# Patient Record
Sex: Female | Born: 1950 | Race: White | Hispanic: No | Marital: Married | Smoking: Former smoker
Health system: Southern US, Community
[De-identification: ages and names within clinical notes are randomized; demographics above are authoritative.]

## PROBLEM LIST (undated history)

## (undated) DIAGNOSIS — D6481 Anemia due to antineoplastic chemotherapy: Principal | ICD-10-CM

## (undated) DIAGNOSIS — Z923 Personal history of irradiation: Secondary | ICD-10-CM

## (undated) DIAGNOSIS — C7931 Secondary malignant neoplasm of brain: Secondary | ICD-10-CM

## (undated) DIAGNOSIS — I10 Essential (primary) hypertension: Secondary | ICD-10-CM

## (undated) DIAGNOSIS — T451X5A Adverse effect of antineoplastic and immunosuppressive drugs, initial encounter: Principal | ICD-10-CM

## (undated) DIAGNOSIS — E0500 Thyrotoxicosis with diffuse goiter without thyrotoxic crisis or storm: Secondary | ICD-10-CM

## (undated) DIAGNOSIS — IMO0001 Reserved for inherently not codable concepts without codable children: Secondary | ICD-10-CM

## (undated) DIAGNOSIS — C801 Malignant (primary) neoplasm, unspecified: Secondary | ICD-10-CM

## (undated) DIAGNOSIS — E079 Disorder of thyroid, unspecified: Secondary | ICD-10-CM

## (undated) DIAGNOSIS — C7951 Secondary malignant neoplasm of bone: Secondary | ICD-10-CM

## (undated) DIAGNOSIS — IMO0002 Reserved for concepts with insufficient information to code with codable children: Secondary | ICD-10-CM

## (undated) DIAGNOSIS — C50919 Malignant neoplasm of unspecified site of unspecified female breast: Principal | ICD-10-CM

## (undated) HISTORY — DX: Adverse effect of antineoplastic and immunosuppressive drugs, initial encounter: T45.1X5A

## (undated) HISTORY — DX: Secondary malignant neoplasm of bone: C79.51

## (undated) HISTORY — DX: Secondary malignant neoplasm of brain: C79.31

## (undated) HISTORY — DX: Reserved for concepts with insufficient information to code with codable children: IMO0002

## (undated) HISTORY — PX: CERVICAL DISC SURGERY: SHX588

## (undated) HISTORY — PX: BREAST BIOPSY: SHX20

## (undated) HISTORY — DX: Malignant (primary) neoplasm, unspecified: C80.1

## (undated) HISTORY — PX: TUBAL LIGATION: SHX77

## (undated) HISTORY — DX: Anemia due to antineoplastic chemotherapy: D64.81

## (undated) HISTORY — DX: Essential (primary) hypertension: I10

## (undated) HISTORY — DX: Disorder of thyroid, unspecified: E07.9

## (undated) HISTORY — PX: OTHER SURGICAL HISTORY: SHX169

## (undated) HISTORY — PX: APPENDECTOMY: SHX54

## (undated) HISTORY — PX: AXILLARY NODE DISSECTION: SHX1211

## (undated) HISTORY — DX: Malignant neoplasm of unspecified site of unspecified female breast: C50.919

## (undated) HISTORY — PX: PARTIAL HYSTERECTOMY: SHX80

## (undated) HISTORY — DX: Thyrotoxicosis with diffuse goiter without thyrotoxic crisis or storm: E05.00

## (undated) HISTORY — PX: MASTECTOMY: SHX3

## (undated) HISTORY — PX: CHOLECYSTECTOMY: SHX55

## (undated) HISTORY — DX: Reserved for inherently not codable concepts without codable children: IMO0001

## (undated) HISTORY — PX: EYE SURGERY: SHX253

---

## 2000-05-01 ENCOUNTER — Encounter: Payer: Self-pay | Admitting: Neurosurgery

## 2000-05-01 ENCOUNTER — Encounter: Admission: RE | Admit: 2000-05-01 | Discharge: 2000-05-01 | Payer: Self-pay | Admitting: Neurosurgery

## 2001-07-27 ENCOUNTER — Encounter: Payer: Self-pay | Admitting: Specialist

## 2001-07-27 ENCOUNTER — Ambulatory Visit (HOSPITAL_COMMUNITY): Admission: RE | Admit: 2001-07-27 | Discharge: 2001-07-27 | Payer: Self-pay | Admitting: *Deleted

## 2001-07-29 ENCOUNTER — Other Ambulatory Visit: Admission: RE | Admit: 2001-07-29 | Discharge: 2001-07-29 | Payer: Self-pay | Admitting: Specialist

## 2001-08-17 ENCOUNTER — Ambulatory Visit (HOSPITAL_COMMUNITY): Admission: RE | Admit: 2001-08-17 | Discharge: 2001-08-17 | Payer: Self-pay | Admitting: Specialist

## 2001-08-17 ENCOUNTER — Encounter: Payer: Self-pay | Admitting: Specialist

## 2002-08-10 ENCOUNTER — Encounter: Payer: Self-pay | Admitting: Specialist

## 2002-08-10 ENCOUNTER — Ambulatory Visit (HOSPITAL_COMMUNITY): Admission: RE | Admit: 2002-08-10 | Discharge: 2002-08-10 | Payer: Self-pay | Admitting: Specialist

## 2003-10-19 ENCOUNTER — Ambulatory Visit (HOSPITAL_COMMUNITY): Admission: RE | Admit: 2003-10-19 | Discharge: 2003-10-19 | Payer: Self-pay | Admitting: Specialist

## 2003-10-19 ENCOUNTER — Ambulatory Visit (HOSPITAL_COMMUNITY): Admission: RE | Admit: 2003-10-19 | Discharge: 2003-10-19 | Payer: Self-pay | Admitting: Family Medicine

## 2005-06-21 ENCOUNTER — Ambulatory Visit (HOSPITAL_COMMUNITY): Admission: RE | Admit: 2005-06-21 | Discharge: 2005-06-21 | Payer: Self-pay | Admitting: Family Medicine

## 2006-04-25 ENCOUNTER — Ambulatory Visit (HOSPITAL_COMMUNITY): Admission: RE | Admit: 2006-04-25 | Discharge: 2006-04-25 | Payer: Self-pay | Admitting: Obstetrics & Gynecology

## 2007-05-15 ENCOUNTER — Ambulatory Visit: Payer: Self-pay | Admitting: Sports Medicine

## 2007-05-15 ENCOUNTER — Ambulatory Visit (HOSPITAL_COMMUNITY): Admission: RE | Admit: 2007-05-15 | Discharge: 2007-05-15 | Payer: Self-pay | Admitting: Obstetrics & Gynecology

## 2007-05-15 ENCOUNTER — Encounter (INDEPENDENT_AMBULATORY_CARE_PROVIDER_SITE_OTHER): Payer: Self-pay | Admitting: *Deleted

## 2007-05-15 ENCOUNTER — Ambulatory Visit (HOSPITAL_COMMUNITY): Admission: RE | Admit: 2007-05-15 | Discharge: 2007-05-15 | Payer: Self-pay | Admitting: Family Medicine

## 2007-05-15 DIAGNOSIS — S96819A Strain of other specified muscles and tendons at ankle and foot level, unspecified foot, initial encounter: Secondary | ICD-10-CM

## 2007-05-15 DIAGNOSIS — S93499A Sprain of other ligament of unspecified ankle, initial encounter: Secondary | ICD-10-CM | POA: Insufficient documentation

## 2007-05-25 ENCOUNTER — Encounter: Admission: RE | Admit: 2007-05-25 | Discharge: 2007-05-25 | Payer: Self-pay | Admitting: Obstetrics & Gynecology

## 2007-11-20 ENCOUNTER — Other Ambulatory Visit: Admission: RE | Admit: 2007-11-20 | Discharge: 2007-11-20 | Payer: Self-pay | Admitting: Obstetrics & Gynecology

## 2007-11-30 ENCOUNTER — Encounter: Admission: RE | Admit: 2007-11-30 | Discharge: 2007-11-30 | Payer: Self-pay | Admitting: Obstetrics & Gynecology

## 2007-12-03 HISTORY — PX: PORTACATH PLACEMENT: SHX2246

## 2008-02-07 ENCOUNTER — Encounter: Admission: RE | Admit: 2008-02-07 | Discharge: 2008-02-07 | Payer: Self-pay | Admitting: Neurosurgery

## 2008-07-18 ENCOUNTER — Encounter: Admission: RE | Admit: 2008-07-18 | Discharge: 2008-07-18 | Payer: Self-pay | Admitting: Obstetrics & Gynecology

## 2008-07-18 ENCOUNTER — Encounter (INDEPENDENT_AMBULATORY_CARE_PROVIDER_SITE_OTHER): Payer: Self-pay | Admitting: Diagnostic Radiology

## 2008-07-24 ENCOUNTER — Encounter: Admission: RE | Admit: 2008-07-24 | Discharge: 2008-07-24 | Payer: Self-pay | Admitting: Obstetrics & Gynecology

## 2008-07-28 ENCOUNTER — Encounter: Admission: RE | Admit: 2008-07-28 | Discharge: 2008-07-28 | Payer: Self-pay | Admitting: Obstetrics & Gynecology

## 2008-07-28 ENCOUNTER — Encounter (INDEPENDENT_AMBULATORY_CARE_PROVIDER_SITE_OTHER): Payer: Self-pay | Admitting: Diagnostic Radiology

## 2008-08-04 ENCOUNTER — Encounter (INDEPENDENT_AMBULATORY_CARE_PROVIDER_SITE_OTHER): Payer: Self-pay | Admitting: Diagnostic Radiology

## 2008-08-04 ENCOUNTER — Encounter: Admission: RE | Admit: 2008-08-04 | Discharge: 2008-08-04 | Payer: Self-pay | Admitting: Obstetrics & Gynecology

## 2008-08-11 ENCOUNTER — Ambulatory Visit (HOSPITAL_COMMUNITY): Payer: Self-pay | Admitting: Oncology

## 2008-08-11 ENCOUNTER — Encounter (INDEPENDENT_AMBULATORY_CARE_PROVIDER_SITE_OTHER): Payer: Self-pay | Admitting: Surgery

## 2008-08-11 ENCOUNTER — Ambulatory Visit (HOSPITAL_BASED_OUTPATIENT_CLINIC_OR_DEPARTMENT_OTHER): Admission: RE | Admit: 2008-08-11 | Discharge: 2008-08-11 | Payer: Self-pay | Admitting: Surgery

## 2008-08-11 ENCOUNTER — Encounter (HOSPITAL_COMMUNITY): Admission: RE | Admit: 2008-08-11 | Discharge: 2008-09-10 | Payer: Self-pay | Admitting: Oncology

## 2008-08-29 ENCOUNTER — Ambulatory Visit (HOSPITAL_COMMUNITY): Admission: RE | Admit: 2008-08-29 | Discharge: 2008-08-29 | Payer: Self-pay | Admitting: Oncology

## 2008-09-02 ENCOUNTER — Ambulatory Visit: Admission: RE | Admit: 2008-09-02 | Discharge: 2008-11-29 | Payer: Self-pay | Admitting: Radiation Oncology

## 2008-09-08 ENCOUNTER — Ambulatory Visit (HOSPITAL_COMMUNITY): Admission: RE | Admit: 2008-09-08 | Discharge: 2008-09-08 | Payer: Self-pay | Admitting: Oncology

## 2008-09-19 ENCOUNTER — Encounter (HOSPITAL_COMMUNITY): Admission: RE | Admit: 2008-09-19 | Discharge: 2008-10-19 | Payer: Self-pay | Admitting: Oncology

## 2008-10-03 ENCOUNTER — Ambulatory Visit (HOSPITAL_COMMUNITY): Payer: Self-pay | Admitting: Oncology

## 2008-11-14 ENCOUNTER — Encounter (HOSPITAL_COMMUNITY): Admission: RE | Admit: 2008-11-14 | Discharge: 2008-12-14 | Payer: Self-pay | Admitting: Oncology

## 2008-11-21 ENCOUNTER — Ambulatory Visit (HOSPITAL_COMMUNITY): Payer: Self-pay | Admitting: Oncology

## 2009-01-13 ENCOUNTER — Ambulatory Visit (HOSPITAL_COMMUNITY): Payer: Self-pay | Admitting: Oncology

## 2009-01-13 ENCOUNTER — Encounter (HOSPITAL_COMMUNITY): Admission: RE | Admit: 2009-01-13 | Discharge: 2009-02-12 | Payer: Self-pay | Admitting: Oncology

## 2009-02-15 ENCOUNTER — Encounter (HOSPITAL_COMMUNITY): Admission: RE | Admit: 2009-02-15 | Discharge: 2009-02-28 | Payer: Self-pay | Admitting: Oncology

## 2009-02-21 ENCOUNTER — Other Ambulatory Visit: Admission: RE | Admit: 2009-02-21 | Discharge: 2009-02-21 | Payer: Self-pay | Admitting: Obstetrics & Gynecology

## 2009-03-10 ENCOUNTER — Encounter (HOSPITAL_COMMUNITY): Admission: RE | Admit: 2009-03-10 | Discharge: 2009-04-09 | Payer: Self-pay | Admitting: Oncology

## 2009-03-10 ENCOUNTER — Ambulatory Visit (HOSPITAL_COMMUNITY): Payer: Self-pay | Admitting: Oncology

## 2009-05-08 ENCOUNTER — Encounter (HOSPITAL_COMMUNITY): Admission: RE | Admit: 2009-05-08 | Discharge: 2009-06-07 | Payer: Self-pay | Admitting: Oncology

## 2009-05-08 ENCOUNTER — Ambulatory Visit (HOSPITAL_COMMUNITY): Payer: Self-pay | Admitting: Oncology

## 2009-07-11 ENCOUNTER — Ambulatory Visit (HOSPITAL_COMMUNITY): Payer: Self-pay | Admitting: Oncology

## 2009-07-11 ENCOUNTER — Encounter (HOSPITAL_COMMUNITY): Admission: RE | Admit: 2009-07-11 | Discharge: 2009-08-10 | Payer: Self-pay | Admitting: Oncology

## 2009-09-05 ENCOUNTER — Ambulatory Visit (HOSPITAL_COMMUNITY): Payer: Self-pay | Admitting: Oncology

## 2009-09-05 ENCOUNTER — Encounter (HOSPITAL_COMMUNITY): Admission: RE | Admit: 2009-09-05 | Discharge: 2009-10-05 | Payer: Self-pay | Admitting: Oncology

## 2009-11-03 ENCOUNTER — Ambulatory Visit (HOSPITAL_COMMUNITY): Payer: Self-pay | Admitting: Oncology

## 2009-11-03 ENCOUNTER — Encounter (HOSPITAL_COMMUNITY): Admission: RE | Admit: 2009-11-03 | Discharge: 2009-11-29 | Payer: Self-pay | Admitting: Oncology

## 2009-11-14 ENCOUNTER — Ambulatory Visit (HOSPITAL_COMMUNITY): Admission: RE | Admit: 2009-11-14 | Discharge: 2009-11-14 | Payer: Self-pay | Admitting: Neurosurgery

## 2009-11-16 ENCOUNTER — Ambulatory Visit: Admission: RE | Admit: 2009-11-16 | Discharge: 2009-12-01 | Payer: Self-pay | Admitting: Radiation Oncology

## 2009-12-03 ENCOUNTER — Ambulatory Visit: Admission: RE | Admit: 2009-12-03 | Discharge: 2009-12-22 | Payer: Self-pay | Admitting: Radiation Oncology

## 2009-12-08 ENCOUNTER — Encounter (HOSPITAL_COMMUNITY): Admission: RE | Admit: 2009-12-08 | Discharge: 2010-01-07 | Payer: Self-pay | Admitting: Oncology

## 2010-01-12 ENCOUNTER — Ambulatory Visit (HOSPITAL_COMMUNITY): Payer: Self-pay | Admitting: Oncology

## 2010-01-12 ENCOUNTER — Encounter (HOSPITAL_COMMUNITY): Admission: RE | Admit: 2010-01-12 | Discharge: 2010-02-11 | Payer: Self-pay | Admitting: Oncology

## 2010-02-09 ENCOUNTER — Encounter (HOSPITAL_COMMUNITY): Admission: RE | Admit: 2010-02-09 | Discharge: 2010-03-11 | Payer: Self-pay | Admitting: Oncology

## 2010-03-08 ENCOUNTER — Ambulatory Visit (HOSPITAL_COMMUNITY): Payer: Self-pay | Admitting: Oncology

## 2010-03-12 ENCOUNTER — Other Ambulatory Visit: Admission: RE | Admit: 2010-03-12 | Discharge: 2010-03-12 | Payer: Self-pay | Admitting: Obstetrics & Gynecology

## 2010-03-19 ENCOUNTER — Encounter (HOSPITAL_COMMUNITY): Admission: RE | Admit: 2010-03-19 | Discharge: 2010-04-18 | Payer: Self-pay | Admitting: Oncology

## 2010-05-03 ENCOUNTER — Ambulatory Visit (HOSPITAL_COMMUNITY): Payer: Self-pay | Admitting: Oncology

## 2010-05-03 ENCOUNTER — Encounter (HOSPITAL_COMMUNITY): Admission: RE | Admit: 2010-05-03 | Discharge: 2010-06-02 | Payer: Self-pay | Admitting: Oncology

## 2010-05-30 ENCOUNTER — Ambulatory Visit (HOSPITAL_COMMUNITY): Admission: RE | Admit: 2010-05-30 | Discharge: 2010-05-30 | Payer: Self-pay | Admitting: Oncology

## 2010-06-13 ENCOUNTER — Encounter: Admission: RE | Admit: 2010-06-13 | Discharge: 2010-08-13 | Payer: Self-pay | Admitting: Oncology

## 2010-06-15 ENCOUNTER — Encounter (HOSPITAL_COMMUNITY): Admission: RE | Admit: 2010-06-15 | Discharge: 2010-07-15 | Payer: Self-pay | Admitting: Oncology

## 2010-07-05 ENCOUNTER — Ambulatory Visit (HOSPITAL_COMMUNITY): Payer: Self-pay | Admitting: Oncology

## 2010-08-02 ENCOUNTER — Encounter (HOSPITAL_COMMUNITY)
Admission: RE | Admit: 2010-08-02 | Discharge: 2010-08-31 | Payer: Self-pay | Source: Home / Self Care | Admitting: Oncology

## 2010-09-07 ENCOUNTER — Ambulatory Visit (HOSPITAL_COMMUNITY): Payer: Self-pay | Admitting: Oncology

## 2010-09-07 ENCOUNTER — Encounter (HOSPITAL_COMMUNITY)
Admission: RE | Admit: 2010-09-07 | Discharge: 2010-10-07 | Payer: Self-pay | Source: Home / Self Care | Admitting: Oncology

## 2010-10-10 ENCOUNTER — Ambulatory Visit (HOSPITAL_COMMUNITY): Admission: RE | Admit: 2010-10-10 | Discharge: 2010-10-10 | Payer: Self-pay | Admitting: Oncology

## 2010-10-11 ENCOUNTER — Encounter (HOSPITAL_COMMUNITY)
Admission: RE | Admit: 2010-10-11 | Discharge: 2010-11-10 | Payer: Self-pay | Source: Home / Self Care | Attending: Oncology | Admitting: Oncology

## 2010-11-02 ENCOUNTER — Encounter (HOSPITAL_COMMUNITY)
Admission: RE | Admit: 2010-11-02 | Discharge: 2010-12-02 | Payer: Self-pay | Source: Home / Self Care | Attending: Oncology | Admitting: Oncology

## 2010-11-02 ENCOUNTER — Ambulatory Visit (HOSPITAL_COMMUNITY): Payer: Self-pay | Admitting: Oncology

## 2010-12-07 ENCOUNTER — Encounter (HOSPITAL_COMMUNITY)
Admission: RE | Admit: 2010-12-07 | Discharge: 2011-01-01 | Payer: Self-pay | Source: Home / Self Care | Attending: Oncology | Admitting: Oncology

## 2011-01-04 ENCOUNTER — Encounter (HOSPITAL_COMMUNITY): Payer: PRIVATE HEALTH INSURANCE | Attending: Oncology

## 2011-01-04 ENCOUNTER — Ambulatory Visit (HOSPITAL_COMMUNITY): Payer: PRIVATE HEALTH INSURANCE

## 2011-01-04 ENCOUNTER — Ambulatory Visit (HOSPITAL_COMMUNITY): Admit: 2011-01-04 | Payer: Self-pay | Admitting: Oncology

## 2011-01-04 DIAGNOSIS — E119 Type 2 diabetes mellitus without complications: Secondary | ICD-10-CM | POA: Insufficient documentation

## 2011-01-04 DIAGNOSIS — C50919 Malignant neoplasm of unspecified site of unspecified female breast: Secondary | ICD-10-CM

## 2011-01-04 DIAGNOSIS — C7951 Secondary malignant neoplasm of bone: Secondary | ICD-10-CM | POA: Insufficient documentation

## 2011-01-04 DIAGNOSIS — Z79899 Other long term (current) drug therapy: Secondary | ICD-10-CM | POA: Insufficient documentation

## 2011-01-04 DIAGNOSIS — C7952 Secondary malignant neoplasm of bone marrow: Secondary | ICD-10-CM | POA: Insufficient documentation

## 2011-01-04 LAB — CBC
MCV: 91 fL (ref 78.0–100.0)
Platelets: 206 10*3/uL (ref 150–400)
RBC: 4.02 MIL/uL (ref 3.87–5.11)
RDW: 12.9 % (ref 11.5–15.5)
WBC: 8.8 10*3/uL (ref 4.0–10.5)

## 2011-01-04 LAB — COMPREHENSIVE METABOLIC PANEL
AST: 22 U/L (ref 0–37)
Albumin: 3.8 g/dL (ref 3.5–5.2)
Alkaline Phosphatase: 56 U/L (ref 39–117)
BUN: 11 mg/dL (ref 6–23)
Chloride: 106 mEq/L (ref 96–112)
GFR calc Af Amer: >60 mL/min (ref >60)
Potassium: 4 mEq/L (ref 3.5–5.1)
Total Protein: 6.6 g/dL (ref 6.0–8.3)

## 2011-01-04 LAB — DIFFERENTIAL
Basophils Absolute: 0 10*3/uL (ref 0.0–0.1)
Basophils Relative: 0 % (ref 0–1)
Eosinophils Absolute: 0.1 10*3/uL (ref 0.0–0.7)
Eosinophils Relative: 1 % (ref 0–5)
Neutrophils Relative %: 69 % (ref 43–77)

## 2011-02-01 ENCOUNTER — Ambulatory Visit (HOSPITAL_COMMUNITY): Payer: PRIVATE HEALTH INSURANCE

## 2011-02-01 ENCOUNTER — Other Ambulatory Visit (HOSPITAL_COMMUNITY): Payer: Self-pay | Admitting: Oncology

## 2011-02-01 ENCOUNTER — Encounter (HOSPITAL_COMMUNITY): Payer: Medicare Other | Attending: Oncology

## 2011-02-01 DIAGNOSIS — C7952 Secondary malignant neoplasm of bone marrow: Secondary | ICD-10-CM

## 2011-02-01 DIAGNOSIS — E119 Type 2 diabetes mellitus without complications: Secondary | ICD-10-CM | POA: Insufficient documentation

## 2011-02-01 DIAGNOSIS — C7951 Secondary malignant neoplasm of bone: Secondary | ICD-10-CM

## 2011-02-01 DIAGNOSIS — C50919 Malignant neoplasm of unspecified site of unspecified female breast: Secondary | ICD-10-CM

## 2011-02-01 DIAGNOSIS — Z79899 Other long term (current) drug therapy: Secondary | ICD-10-CM | POA: Insufficient documentation

## 2011-02-02 LAB — CANCER ANTIGEN 27.29: CA 27.29: 38 U/mL (ref 0–39)

## 2011-02-11 LAB — COMPREHENSIVE METABOLIC PANEL
ALT: 14 U/L (ref 0–35)
Albumin: 4.1 g/dL (ref 3.5–5.2)
Alkaline Phosphatase: 50 U/L (ref 39–117)
Chloride: 107 mEq/L (ref 96–112)
Glucose, Bld: 102 mg/dL — ABNORMAL HIGH (ref 70–99)
Potassium: 3.9 mEq/L (ref 3.5–5.1)
Sodium: 141 mEq/L (ref 135–145)
Total Bilirubin: 0.6 mg/dL (ref 0.3–1.2)
Total Protein: 6.5 g/dL (ref 6.0–8.3)

## 2011-02-11 LAB — CANCER ANTIGEN 27.29: CA 27.29: 32 U/mL (ref 0–39)

## 2011-02-12 LAB — COMPREHENSIVE METABOLIC PANEL
ALT: 14 U/L (ref 0–35)
AST: 20 U/L (ref 0–37)
Albumin: 3.8 g/dL (ref 3.5–5.2)
Alkaline Phosphatase: 51 U/L (ref 39–117)
CO2: 25 mEq/L (ref 19–32)
Calcium: 9.9 mg/dL (ref 8.4–10.5)
Creatinine, Ser: 0.66 mg/dL (ref 0.4–1.2)
GFR calc Af Amer: >60 mL/min (ref >60)
GFR calc non Af Amer: >60 mL/min (ref >60)
Glucose, Bld: 115 mg/dL — ABNORMAL HIGH (ref 70–99)
Potassium: 4.1 mEq/L (ref 3.5–5.1)
Sodium: 137 mEq/L (ref 135–145)
Total Protein: 6.3 g/dL (ref 6.0–8.3)

## 2011-02-12 LAB — GLUCOSE, CAPILLARY: Glucose-Capillary: 86 mg/dL (ref 70–99)

## 2011-02-14 LAB — COMPREHENSIVE METABOLIC PANEL
ALT: 12 U/L (ref 0–35)
ALT: 16 U/L (ref 0–35)
Albumin: 4 g/dL (ref 3.5–5.2)
Alkaline Phosphatase: 49 U/L (ref 39–117)
Alkaline Phosphatase: 52 U/L (ref 39–117)
CO2: 25 mEq/L (ref 19–32)
Calcium: 9.6 mg/dL (ref 8.4–10.5)
GFR calc Af Amer: >60 mL/min (ref >60)
GFR calc non Af Amer: >60 mL/min (ref >60)
Glucose, Bld: 94 mg/dL (ref 70–99)
Potassium: 3.7 mEq/L (ref 3.5–5.1)
Sodium: 137 mEq/L (ref 135–145)
Sodium: 137 mEq/L (ref 135–145)
Total Protein: 6.6 g/dL (ref 6.0–8.3)

## 2011-02-14 LAB — CBC
HCT: 36.2 % (ref 36.0–46.0)
Hemoglobin: 12.3 g/dL (ref 12.0–15.0)
MCHC: 34 g/dL (ref 30.0–36.0)
Platelets: 216 10*3/uL (ref 150–400)
RDW: 13.6 % (ref 11.5–15.5)
WBC: 7 10*3/uL (ref 4.0–10.5)

## 2011-02-14 LAB — DIFFERENTIAL
Basophils Absolute: 0 10*3/uL (ref 0.0–0.1)
Basophils Relative: 0 % (ref 0–1)
Basophils Relative: 0 % (ref 0–1)
Eosinophils Absolute: 0 10*3/uL (ref 0.0–0.7)
Eosinophils Absolute: 0 10*3/uL (ref 0.0–0.7)
Lymphs Abs: 1.2 10*3/uL (ref 0.7–4.0)
Lymphs Abs: 1.4 10*3/uL (ref 0.7–4.0)
Monocytes Absolute: 0.6 10*3/uL (ref 0.1–1.0)
Monocytes Relative: 9 % (ref 3–12)
Neutrophils Relative %: 70 % (ref 43–77)

## 2011-02-14 LAB — CANCER ANTIGEN 27.29
CA 27.29: 21 U/mL (ref 0–39)
CA 27.29: 23 U/mL (ref 0–39)

## 2011-02-15 LAB — COMPREHENSIVE METABOLIC PANEL
ALT: 15 U/L (ref 0–35)
CO2: 27 mEq/L (ref 19–32)
Calcium: 9.1 mg/dL (ref 8.4–10.5)
Chloride: 106 mEq/L (ref 96–112)
Creatinine, Ser: 0.7 mg/dL (ref 0.4–1.2)
GFR calc non Af Amer: >60 mL/min (ref >60)
Glucose, Bld: 121 mg/dL — ABNORMAL HIGH (ref 70–99)
Sodium: 137 mEq/L (ref 135–145)
Total Bilirubin: 0.5 mg/dL (ref 0.3–1.2)

## 2011-02-17 LAB — COMPREHENSIVE METABOLIC PANEL
ALT: 13 U/L (ref 0–35)
AST: 21 U/L (ref 0–37)
Albumin: 3.8 g/dL (ref 3.5–5.2)
Alkaline Phosphatase: 39 U/L (ref 39–117)
BUN: 15 mg/dL (ref 6–23)
CO2: 27 mEq/L (ref 19–32)
Calcium: 9.7 mg/dL (ref 8.4–10.5)
Calcium: 9.5 mg/dL (ref 8.4–10.5)
Chloride: 105 mEq/L (ref 96–112)
Creatinine, Ser: 0.65 mg/dL (ref 0.4–1.2)
GFR calc Af Amer: >60 mL/min (ref >60)
GFR calc non Af Amer: >60 mL/min (ref >60)
Glucose, Bld: 87 mg/dL (ref 70–99)
Potassium: 3.6 mEq/L (ref 3.5–5.1)
Potassium: 3.6 mEq/L (ref 3.5–5.1)
Sodium: 137 mEq/L (ref 135–145)
Total Bilirubin: 0.4 mg/dL (ref 0.3–1.2)
Total Protein: 6.3 g/dL (ref 6.0–8.3)

## 2011-02-17 LAB — CANCER ANTIGEN 27.29: CA 27.29: 17 U/mL (ref 0–39)

## 2011-02-18 LAB — COMPREHENSIVE METABOLIC PANEL
Alkaline Phosphatase: 60 U/L (ref 39–117)
BUN: 9 mg/dL (ref 6–23)
Glucose, Bld: 86 mg/dL (ref 70–99)
Potassium: 3.8 mEq/L (ref 3.5–5.1)
Total Bilirubin: 0.4 mg/dL (ref 0.3–1.2)
Total Protein: 6.7 g/dL (ref 6.0–8.3)

## 2011-02-19 ENCOUNTER — Ambulatory Visit (HOSPITAL_COMMUNITY): Payer: PRIVATE HEALTH INSURANCE | Admitting: Oncology

## 2011-02-19 ENCOUNTER — Other Ambulatory Visit (HOSPITAL_COMMUNITY): Payer: Self-pay | Admitting: Oncology

## 2011-02-19 DIAGNOSIS — C50919 Malignant neoplasm of unspecified site of unspecified female breast: Secondary | ICD-10-CM

## 2011-02-19 LAB — CBC
HCT: 35.6 % — ABNORMAL LOW (ref 36.0–46.0)
Platelets: 227 10*3/uL (ref 150–400)
WBC: 6.4 10*3/uL (ref 4.0–10.5)

## 2011-02-19 LAB — DIFFERENTIAL
Basophils Absolute: 0 10*3/uL (ref 0.0–0.1)
Basophils Relative: 0 % (ref 0–1)
Eosinophils Relative: 2 % (ref 0–5)
Monocytes Absolute: 0.6 10*3/uL (ref 0.1–1.0)

## 2011-02-19 LAB — COMPREHENSIVE METABOLIC PANEL
ALT: 11 U/L (ref 0–35)
AST: 20 U/L (ref 0–37)
Albumin: 3.8 g/dL (ref 3.5–5.2)
Alkaline Phosphatase: 56 U/L (ref 39–117)
Chloride: 105 mEq/L (ref 96–112)
GFR calc Af Amer: >60 mL/min (ref >60)
Potassium: 3.8 mEq/L (ref 3.5–5.1)
Sodium: 138 mEq/L (ref 135–145)
Total Bilirubin: 0.6 mg/dL (ref 0.3–1.2)

## 2011-02-19 LAB — CANCER ANTIGEN 27.29: CA 27.29: 21 U/mL (ref 0–39)

## 2011-02-20 LAB — COMPREHENSIVE METABOLIC PANEL
Albumin: 4 g/dL (ref 3.5–5.2)
Alkaline Phosphatase: 60 U/L (ref 39–117)
BUN: 13 mg/dL (ref 6–23)
BUN: 15 mg/dL (ref 6–23)
CO2: 25 mEq/L (ref 19–32)
Calcium: 9.4 mg/dL (ref 8.4–10.5)
Chloride: 105 mEq/L (ref 96–112)
GFR calc Af Amer: >60 mL/min (ref >60)
GFR calc non Af Amer: >60 mL/min (ref >60)
Glucose, Bld: 96 mg/dL (ref 70–99)
Sodium: 137 mEq/L (ref 135–145)
Sodium: 138 mEq/L (ref 135–145)
Total Bilirubin: 0.4 mg/dL (ref 0.3–1.2)
Total Protein: 6.2 g/dL (ref 6.0–8.3)

## 2011-02-20 LAB — CBC
MCHC: 34.6 g/dL (ref 30.0–36.0)
MCV: 94 fL (ref 78.0–100.0)
RDW: 12.9 % (ref 11.5–15.5)

## 2011-02-20 LAB — DIFFERENTIAL
Basophils Absolute: 0 10*3/uL (ref 0.0–0.1)
Basophils Relative: 0 % (ref 0–1)
Eosinophils Relative: 1 % (ref 0–5)
Monocytes Absolute: 0.6 10*3/uL (ref 0.1–1.0)
Neutro Abs: 6.5 10*3/uL (ref 1.7–7.7)

## 2011-02-24 LAB — COMPREHENSIVE METABOLIC PANEL
AST: 21 U/L (ref 0–37)
Albumin: 3.8 g/dL (ref 3.5–5.2)
Alkaline Phosphatase: 54 U/L (ref 39–117)
Chloride: 105 mEq/L (ref 96–112)
GFR calc Af Amer: >60 mL/min (ref >60)
Potassium: 3.7 mEq/L (ref 3.5–5.1)
Sodium: 138 mEq/L (ref 135–145)
Total Bilirubin: 0.4 mg/dL (ref 0.3–1.2)

## 2011-02-24 LAB — CANCER ANTIGEN 27.29: CA 27.29: 26 U/mL (ref 0–39)

## 2011-02-28 ENCOUNTER — Ambulatory Visit (HOSPITAL_COMMUNITY): Payer: PRIVATE HEALTH INSURANCE

## 2011-03-04 ENCOUNTER — Ambulatory Visit (HOSPITAL_COMMUNITY)
Admission: RE | Admit: 2011-03-04 | Discharge: 2011-03-04 | Disposition: A | Discharge status: Home / Self Care | Payer: Medicare Other | Source: Ambulatory Visit | Attending: Oncology | Admitting: Oncology

## 2011-03-04 DIAGNOSIS — I972 Postmastectomy lymphedema syndrome: Secondary | ICD-10-CM | POA: Insufficient documentation

## 2011-03-04 DIAGNOSIS — IMO0001 Reserved for inherently not codable concepts without codable children: Secondary | ICD-10-CM | POA: Insufficient documentation

## 2011-03-04 DIAGNOSIS — E119 Type 2 diabetes mellitus without complications: Secondary | ICD-10-CM | POA: Insufficient documentation

## 2011-03-05 ENCOUNTER — Ambulatory Visit (HOSPITAL_COMMUNITY)
Admission: RE | Admit: 2011-03-05 | Discharge: 2011-03-05 | Disposition: A | Discharge status: Home / Self Care | Payer: Medicare Other | Source: Ambulatory Visit | Attending: Oncology | Admitting: Oncology

## 2011-03-05 ENCOUNTER — Ambulatory Visit (HOSPITAL_COMMUNITY): Payer: Medicare Other | Admitting: Physical Therapy

## 2011-03-05 DIAGNOSIS — IMO0001 Reserved for inherently not codable concepts without codable children: Secondary | ICD-10-CM | POA: Insufficient documentation

## 2011-03-05 DIAGNOSIS — I972 Postmastectomy lymphedema syndrome: Secondary | ICD-10-CM | POA: Insufficient documentation

## 2011-03-05 DIAGNOSIS — E119 Type 2 diabetes mellitus without complications: Secondary | ICD-10-CM | POA: Insufficient documentation

## 2011-03-05 LAB — CBC
MCHC: 34.4 g/dL (ref 30.0–36.0)
MCV: 93.7 fL (ref 78.0–100.0)
Platelets: 234 10*3/uL (ref 150–400)
RBC: 3.93 MIL/uL (ref 3.87–5.11)

## 2011-03-05 LAB — DIFFERENTIAL
Eosinophils Relative: 0 % (ref 0–5)
Lymphocytes Relative: 27 % (ref 12–46)
Lymphs Abs: 2 10*3/uL (ref 0.7–4.0)
Neutro Abs: 4.7 10*3/uL (ref 1.7–7.7)

## 2011-03-05 LAB — COMPREHENSIVE METABOLIC PANEL
AST: 19 U/L (ref 0–37)
CO2: 29 mEq/L (ref 19–32)
Calcium: 10 mg/dL (ref 8.4–10.5)
Creatinine, Ser: 0.56 mg/dL (ref 0.4–1.2)
GFR calc Af Amer: >60 mL/min (ref >60)
GFR calc non Af Amer: >60 mL/min (ref >60)

## 2011-03-06 ENCOUNTER — Ambulatory Visit (HOSPITAL_COMMUNITY)
Admission: RE | Admit: 2011-03-06 | Discharge: 2011-03-06 | Disposition: A | Discharge status: Home / Self Care | Payer: Medicare Other | Source: Ambulatory Visit | Admitting: Physical Therapy

## 2011-03-06 LAB — COMPREHENSIVE METABOLIC PANEL
ALT: 11 U/L (ref 0–35)
AST: 17 U/L (ref 0–37)
Albumin: 3.9 g/dL (ref 3.5–5.2)
Alkaline Phosphatase: 56 U/L (ref 39–117)
BUN: 12 mg/dL (ref 6–23)
Chloride: 107 mEq/L (ref 96–112)
GFR calc Af Amer: >60 mL/min (ref >60)
Potassium: 3.9 mEq/L (ref 3.5–5.1)
Sodium: 140 mEq/L (ref 135–145)
Total Bilirubin: 0.3 mg/dL (ref 0.3–1.2)

## 2011-03-06 LAB — CANCER ANTIGEN 27.29: CA 27.29: 21 U/mL (ref 0–39)

## 2011-03-07 ENCOUNTER — Other Ambulatory Visit (HOSPITAL_COMMUNITY): Payer: Self-pay | Admitting: Oncology

## 2011-03-07 ENCOUNTER — Encounter (HOSPITAL_COMMUNITY): Payer: Medicare Other | Attending: Oncology

## 2011-03-07 ENCOUNTER — Ambulatory Visit (HOSPITAL_COMMUNITY)
Admission: RE | Admit: 2011-03-07 | Discharge: 2011-03-07 | Disposition: A | Discharge status: Home / Self Care | Payer: Medicare Other | Source: Ambulatory Visit | Admitting: Physical Therapy

## 2011-03-07 DIAGNOSIS — C7951 Secondary malignant neoplasm of bone: Secondary | ICD-10-CM

## 2011-03-07 DIAGNOSIS — C50919 Malignant neoplasm of unspecified site of unspecified female breast: Secondary | ICD-10-CM

## 2011-03-07 DIAGNOSIS — Z79899 Other long term (current) drug therapy: Secondary | ICD-10-CM | POA: Insufficient documentation

## 2011-03-07 DIAGNOSIS — E119 Type 2 diabetes mellitus without complications: Secondary | ICD-10-CM | POA: Insufficient documentation

## 2011-03-07 DIAGNOSIS — C7952 Secondary malignant neoplasm of bone marrow: Secondary | ICD-10-CM

## 2011-03-07 LAB — COMPREHENSIVE METABOLIC PANEL
ALT: 13 U/L (ref 0–35)
Albumin: 3.9 g/dL (ref 3.5–5.2)
Alkaline Phosphatase: 49 U/L (ref 39–117)
BUN: 14 mg/dL (ref 6–23)
BUN: 15 mg/dL (ref 6–23)
Calcium: 9.6 mg/dL (ref 8.4–10.5)
Calcium: 9.3 mg/dL (ref 8.4–10.5)
Creatinine, Ser: 0.67 mg/dL (ref 0.4–1.2)
GFR calc non Af Amer: >60 mL/min (ref >60)
Glucose, Bld: 106 mg/dL — ABNORMAL HIGH (ref 70–99)
Potassium: 3.5 mEq/L (ref 3.5–5.1)
Sodium: 141 mEq/L (ref 135–145)
Total Protein: 6.2 g/dL (ref 6.0–8.3)
Total Protein: 6.6 g/dL (ref 6.0–8.3)

## 2011-03-07 LAB — CANCER ANTIGEN 27.29
CA 27.29: 39 U/mL (ref 0–39)
CA 27.29: 13 U/mL (ref 0–39)

## 2011-03-08 LAB — COMPREHENSIVE METABOLIC PANEL
BUN: 14 mg/dL (ref 6–23)
CO2: 27 mEq/L (ref 19–32)
Chloride: 106 mEq/L (ref 96–112)
Creatinine, Ser: 0.54 mg/dL (ref 0.4–1.2)
GFR calc non Af Amer: >60 mL/min (ref >60)
Total Bilirubin: 0.4 mg/dL (ref 0.3–1.2)

## 2011-03-08 LAB — CANCER ANTIGEN 27.29: CA 27.29: 17 U/mL (ref 0–39)

## 2011-03-09 LAB — COMPREHENSIVE METABOLIC PANEL
ALT: 13 U/L (ref 0–35)
AST: 20 U/L (ref 0–37)
Calcium: 9.5 mg/dL (ref 8.4–10.5)
GFR calc Af Amer: >60 mL/min (ref >60)
Sodium: 139 mEq/L (ref 135–145)
Total Protein: 6.4 g/dL (ref 6.0–8.3)

## 2011-03-09 LAB — CANCER ANTIGEN 27.29: CA 27.29: 25 U/mL (ref 0–39)

## 2011-03-10 LAB — COMPREHENSIVE METABOLIC PANEL
ALT: 16 U/L (ref 0–35)
Albumin: 3.7 g/dL (ref 3.5–5.2)
Alkaline Phosphatase: 62 U/L (ref 39–117)
BUN: 15 mg/dL (ref 6–23)
Chloride: 105 mEq/L (ref 96–112)
Glucose, Bld: 140 mg/dL — ABNORMAL HIGH (ref 70–99)
Potassium: 3.6 mEq/L (ref 3.5–5.1)
Total Bilirubin: 0.5 mg/dL (ref 0.3–1.2)

## 2011-03-10 LAB — CANCER ANTIGEN 27.29: CA 27.29: 15 U/mL (ref 0–39)

## 2011-03-11 ENCOUNTER — Ambulatory Visit (HOSPITAL_COMMUNITY)
Admission: RE | Admit: 2011-03-11 | Discharge: 2011-03-11 | Disposition: A | Discharge status: Home / Self Care | Payer: Medicare Other | Source: Ambulatory Visit | Attending: *Deleted | Admitting: *Deleted

## 2011-03-11 LAB — COMPREHENSIVE METABOLIC PANEL
ALT: 16 U/L (ref 0–35)
AST: 21 U/L (ref 0–37)
Albumin: 3.9 g/dL (ref 3.5–5.2)
CO2: 27 mEq/L (ref 19–32)
Calcium: 9.8 mg/dL (ref 8.4–10.5)
GFR calc Af Amer: >60 mL/min (ref >60)
GFR calc non Af Amer: >60 mL/min (ref >60)
Sodium: 135 mEq/L (ref 135–145)
Total Protein: 6.8 g/dL (ref 6.0–8.3)

## 2011-03-11 LAB — CBC
MCHC: 35.5 g/dL (ref 30.0–36.0)
Platelets: 222 10*3/uL (ref 150–400)
RBC: 3.94 MIL/uL (ref 3.87–5.11)

## 2011-03-12 ENCOUNTER — Ambulatory Visit (HOSPITAL_COMMUNITY)
Admission: RE | Admit: 2011-03-12 | Discharge: 2011-03-12 | Disposition: A | Discharge status: Home / Self Care | Payer: Medicare Other | Source: Ambulatory Visit | Attending: *Deleted | Admitting: *Deleted

## 2011-03-12 LAB — COMPREHENSIVE METABOLIC PANEL
ALT: 21 U/L (ref 0–35)
Alkaline Phosphatase: 63 U/L (ref 39–117)
BUN: 17 mg/dL (ref 6–23)
CO2: 25 mEq/L (ref 19–32)
Chloride: 107 mEq/L (ref 96–112)
GFR calc non Af Amer: >60 mL/min (ref >60)
Glucose, Bld: 143 mg/dL — ABNORMAL HIGH (ref 70–99)
Potassium: 3.5 mEq/L (ref 3.5–5.1)
Sodium: 139 mEq/L (ref 135–145)
Total Bilirubin: 0.6 mg/dL (ref 0.3–1.2)

## 2011-03-13 ENCOUNTER — Ambulatory Visit (HOSPITAL_COMMUNITY)
Admission: RE | Admit: 2011-03-13 | Discharge: 2011-03-13 | Disposition: A | Discharge status: Home / Self Care | Payer: Medicare Other | Source: Ambulatory Visit | Attending: *Deleted | Admitting: *Deleted

## 2011-03-13 LAB — COMPREHENSIVE METABOLIC PANEL
AST: 26 U/L (ref 0–37)
Albumin: 3.6 g/dL (ref 3.5–5.2)
BUN: 17 mg/dL (ref 6–23)
Calcium: 9.4 mg/dL (ref 8.4–10.5)
Creatinine, Ser: 0.6 mg/dL (ref 0.4–1.2)
GFR calc Af Amer: >60 mL/min (ref >60)
GFR calc non Af Amer: >60 mL/min (ref >60)

## 2011-03-13 LAB — CANCER ANTIGEN 27.29: CA 27.29: 14 U/mL (ref 0–39)

## 2011-03-14 ENCOUNTER — Ambulatory Visit (HOSPITAL_COMMUNITY)
Admission: RE | Admit: 2011-03-14 | Discharge: 2011-03-14 | Disposition: A | Discharge status: Home / Self Care | Payer: Medicare Other | Source: Ambulatory Visit | Attending: Family Medicine | Admitting: Family Medicine

## 2011-03-14 LAB — COMPREHENSIVE METABOLIC PANEL
ALT: 21 U/L (ref 0–35)
AST: 24 U/L (ref 0–37)
Alkaline Phosphatase: 99 U/L (ref 39–117)
CO2: 27 mEq/L (ref 19–32)
Calcium: 9.7 mg/dL (ref 8.4–10.5)
GFR calc Af Amer: >60 mL/min (ref >60)
Glucose, Bld: 148 mg/dL — ABNORMAL HIGH (ref 70–99)
Potassium: 3.6 mEq/L (ref 3.5–5.1)
Sodium: 139 mEq/L (ref 135–145)
Total Protein: 6.1 g/dL (ref 6.0–8.3)

## 2011-03-18 ENCOUNTER — Ambulatory Visit (HOSPITAL_COMMUNITY): Payer: Medicare Other

## 2011-03-18 ENCOUNTER — Ambulatory Visit (HOSPITAL_COMMUNITY)
Admission: RE | Admit: 2011-03-18 | Discharge: 2011-03-18 | Disposition: A | Discharge status: Home / Self Care | Payer: Medicare Other | Source: Ambulatory Visit | Attending: Family Medicine | Admitting: Family Medicine

## 2011-03-18 LAB — COMPREHENSIVE METABOLIC PANEL
Albumin: 3.6 g/dL (ref 3.5–5.2)
Alkaline Phosphatase: 151 U/L — ABNORMAL HIGH (ref 39–117)
BUN: 16 mg/dL (ref 6–23)
Chloride: 101 mEq/L (ref 96–112)
Creatinine, Ser: 0.67 mg/dL (ref 0.4–1.2)
GFR calc non Af Amer: >60 mL/min (ref >60)
Glucose, Bld: 169 mg/dL — ABNORMAL HIGH (ref 70–99)
Potassium: 3.3 mEq/L — ABNORMAL LOW (ref 3.5–5.1)
Total Bilirubin: 0.5 mg/dL (ref 0.3–1.2)

## 2011-03-18 LAB — CANCER ANTIGEN 27.29: CA 27.29: 21 U/mL (ref 0–39)

## 2011-03-19 ENCOUNTER — Ambulatory Visit (HOSPITAL_COMMUNITY): Payer: Medicare Other | Admitting: Physical Therapy

## 2011-03-19 LAB — COMPREHENSIVE METABOLIC PANEL
ALT: 14 U/L (ref 0–35)
AST: 20 U/L (ref 0–37)
Albumin: 3.5 g/dL (ref 3.5–5.2)
Calcium: 9.2 mg/dL (ref 8.4–10.5)
Creatinine, Ser: 0.65 mg/dL (ref 0.4–1.2)
GFR calc Af Amer: >60 mL/min (ref >60)
GFR calc non Af Amer: >60 mL/min (ref >60)
Sodium: 138 mEq/L (ref 135–145)
Total Protein: 6.2 g/dL (ref 6.0–8.3)

## 2011-03-19 LAB — CANCER ANTIGEN 27.29: CA 27.29: 19 U/mL (ref 0–39)

## 2011-03-20 ENCOUNTER — Ambulatory Visit (HOSPITAL_COMMUNITY)
Admission: RE | Admit: 2011-03-20 | Discharge: 2011-03-20 | Disposition: A | Discharge status: Home / Self Care | Payer: Medicare Other | Source: Ambulatory Visit | Attending: Oncology | Admitting: Oncology

## 2011-03-21 ENCOUNTER — Ambulatory Visit (HOSPITAL_COMMUNITY)
Admission: RE | Admit: 2011-03-21 | Discharge: 2011-03-21 | Disposition: A | Discharge status: Home / Self Care | Payer: Medicare Other | Source: Ambulatory Visit | Attending: Family Medicine | Admitting: Family Medicine

## 2011-03-25 ENCOUNTER — Other Ambulatory Visit: Payer: Self-pay | Admitting: Obstetrics & Gynecology

## 2011-03-25 ENCOUNTER — Other Ambulatory Visit (HOSPITAL_COMMUNITY)
Admission: RE | Admit: 2011-03-25 | Discharge: 2011-03-25 | Disposition: A | Discharge status: Home / Self Care | Payer: Medicare Other | Source: Ambulatory Visit | Attending: Obstetrics & Gynecology | Admitting: Obstetrics & Gynecology

## 2011-03-25 ENCOUNTER — Ambulatory Visit (HOSPITAL_COMMUNITY)
Admission: RE | Admit: 2011-03-25 | Discharge: 2011-03-25 | Disposition: A | Discharge status: Home / Self Care | Payer: Medicare Other | Source: Ambulatory Visit | Attending: Family Medicine | Admitting: Family Medicine

## 2011-03-25 DIAGNOSIS — Z01419 Encounter for gynecological examination (general) (routine) without abnormal findings: Secondary | ICD-10-CM | POA: Insufficient documentation

## 2011-03-26 ENCOUNTER — Ambulatory Visit (HOSPITAL_COMMUNITY)
Admission: RE | Admit: 2011-03-26 | Discharge: 2011-03-26 | Disposition: A | Discharge status: Home / Self Care | Payer: Medicare Other | Source: Ambulatory Visit | Attending: Family Medicine | Admitting: Family Medicine

## 2011-03-27 ENCOUNTER — Ambulatory Visit (HOSPITAL_COMMUNITY)
Admission: RE | Admit: 2011-03-27 | Discharge: 2011-03-27 | Disposition: A | Discharge status: Home / Self Care | Payer: Medicare Other | Source: Ambulatory Visit | Attending: Family Medicine | Admitting: Family Medicine

## 2011-03-28 ENCOUNTER — Ambulatory Visit (HOSPITAL_COMMUNITY)
Admission: RE | Admit: 2011-03-28 | Discharge: 2011-03-28 | Disposition: A | Discharge status: Home / Self Care | Payer: Medicare Other | Source: Ambulatory Visit | Attending: Family Medicine | Admitting: Family Medicine

## 2011-04-04 ENCOUNTER — Encounter (HOSPITAL_COMMUNITY): Payer: Medicare Other | Attending: Oncology

## 2011-04-04 ENCOUNTER — Other Ambulatory Visit (HOSPITAL_COMMUNITY): Payer: Self-pay | Admitting: Oncology

## 2011-04-04 DIAGNOSIS — C7951 Secondary malignant neoplasm of bone: Secondary | ICD-10-CM

## 2011-04-04 DIAGNOSIS — C50919 Malignant neoplasm of unspecified site of unspecified female breast: Secondary | ICD-10-CM | POA: Insufficient documentation

## 2011-04-04 DIAGNOSIS — E119 Type 2 diabetes mellitus without complications: Secondary | ICD-10-CM | POA: Insufficient documentation

## 2011-04-04 DIAGNOSIS — Z79899 Other long term (current) drug therapy: Secondary | ICD-10-CM | POA: Insufficient documentation

## 2011-04-04 DIAGNOSIS — C7981 Secondary malignant neoplasm of breast: Secondary | ICD-10-CM

## 2011-04-04 DIAGNOSIS — C7952 Secondary malignant neoplasm of bone marrow: Secondary | ICD-10-CM

## 2011-04-04 LAB — COMPREHENSIVE METABOLIC PANEL
ALT: 10 U/L (ref 0–35)
AST: 17 U/L (ref 0–37)
Albumin: 3.6 g/dL (ref 3.5–5.2)
Alkaline Phosphatase: 57 U/L (ref 39–117)
BUN: 17 mg/dL (ref 6–23)
CO2: 28 mEq/L (ref 19–32)
Calcium: 9.2 mg/dL (ref 8.4–10.5)
Chloride: 106 mEq/L (ref 96–112)
Creatinine, Ser: 0.56 mg/dL (ref 0.4–1.2)
GFR calc Af Amer: >60 mL/min (ref >60)
GFR calc non Af Amer: >60 mL/min (ref >60)
Glucose, Bld: 134 mg/dL — ABNORMAL HIGH (ref 70–99)
Potassium: 3.9 mEq/L (ref 3.5–5.1)
Sodium: 141 mEq/L (ref 135–145)
Total Bilirubin: 0.2 mg/dL — ABNORMAL LOW (ref 0.3–1.2)
Total Protein: 6 g/dL (ref 6.0–8.3)

## 2011-04-04 LAB — CBC
HCT: 36.8 % (ref 36.0–46.0)
MCH: 31.1 pg (ref 26.0–34.0)
MCV: 93.9 fL (ref 78.0–100.0)
RDW: 13.1 % (ref 11.5–15.5)
WBC: 5.9 10*3/uL (ref 4.0–10.5)

## 2011-04-04 LAB — DIFFERENTIAL
Eosinophils Absolute: 0.1 10*3/uL (ref 0.0–0.7)
Eosinophils Relative: 1 % (ref 0–5)
Lymphocytes Relative: 19 % (ref 12–46)
Lymphs Abs: 1.1 10*3/uL (ref 0.7–4.0)
Monocytes Absolute: 0.4 10*3/uL (ref 0.1–1.0)
Monocytes Relative: 7 % (ref 3–12)

## 2011-04-04 LAB — CANCER ANTIGEN 27.29: CA 27.29: 45 U/mL — ABNORMAL HIGH (ref 0–39)

## 2011-04-16 ENCOUNTER — Encounter (INDEPENDENT_AMBULATORY_CARE_PROVIDER_SITE_OTHER): Payer: Self-pay | Admitting: Surgery

## 2011-04-16 NOTE — Op Note (Signed)
Kristen Parker, Kristen Parker               ACCOUNT NO.:  [id number]   MEDICAL RECORD NO.:  [id number]          PATIENT TYPE:  AMB   LOCATION:  DSC                          FACILITY:  [facility]   PHYSICIAN:  [name], M.D.DATE OF BIRTH:  [birth year]   DATE OF PROCEDURE:  08/11/2008  DATE OF DISCHARGE:                               OPERATIVE REPORT   PREOPERATIVE DIAGNOSIS:  Carcinoma, right breast, centrally located,  clinical stage II.   POSTOPERATIVE DIAGNOSIS:  Carcinoma, right breast, centrally located,  clinical stage II.   OPERATION:  Right modified radical mastectomy, Port-A-Cath placement.   SURGEON:  [name], MD   ANESTHESIA:  General.   CLINICAL HISTORY:  This is a 60 year old lady recently diagnosed with  what appeared to be a 6-7 cm lobular invasive carcinoma with a known  metastasis to a lymph node.  After lengthy discussion with the patient,  presentation at the clinical breast conference and consultation with her  oncologist, Dr. Glenford Peers, we elected to proceed to a right  modified radical mastectomy.  Because it was clear she would need  chemotherapy postoperatively and she was already aware of that, she  elected to have [place] go ahead and place a Port-A-Cath at the time of  surgery.   DESCRIPTION OF PROCEDURE:  The patient was seen in the holding area and  she had no further questions.  We confirmed the procedure as outlined  above and I initialed the right breast as the site for the mastectomy.   The patient was taken to the operating room.  After satisfactory general  anesthesia had been obtained, the breast and upper chest bilaterally  were prepped and draped as a sterile field with a left side being  covered and out of the direct exposure.  I had outlined and marked the  inframammary fold in the middle aspect of the breast and outlined an  elliptical incision to take a fairly wide area of skin, so we would  likely to have close skin margins.   I then raised skin flaps in the  usual fashion superiorly towards the clavicle and medially to the  sternum and laterally out to latissimus.  The inferior flap was then  raised going down to inframammary fold and out until we could identify  the latissimus muscle.   The breast was then removed from the underlying pectoralis, taking the  fascia, starting medially and working laterally.  As I got to the edge  of the pectoralis, I opened the clavipectoral fascia.  I freed up the  breast tissue from the inferior attachments to the latissimus and  serratus, so that I had more mobility.   As I dissected up along the clavipectoral fascia, I could see multiple  positive nodes and I pulled some out from Rotter's space that looked  involved.  I clipped some of the vessels coming up towards the  pectoralis major as they appeared to have nodes involved with them and  divided and clipped the second intercostal nerve.   I then used some dissection around the axillary vein until I could see  the inferior margin of it and then worked medially and started taking  the axillary tissue from medial to lateral going as high as I could  reach staying inferior to the vein.  The nodes in this highest area  appeared grossly involved with tumor.  As I peeled these off, I felt  more nodes and there were really multiple nodes involved along the long  thoracic nerve and I dissected those off preserving the nerve.  As I got  a little further laterally, there were some more nodes around, some  vessels coming off the axillary vein and the vessels were divided and  the nodes taken until I got to the area of the vessels going to the  latissimus.  Here, there were also nodes involved and I stripped these  off the vessels preserving both the vessels and the nerve to the  latissimus.  Some additional nodes were present just lateral to that,  inferior to the vein.  Those were taken out and the axillary attachments  were  divided.  Once all this was done, I was able to more safely divide  the remaining attachments to the chest wall of the breast starting  medially working laterally and then lifting all this up and dividing it  off the anterior edge of the latissimus dorsi muscle.  This specimen was  then handed off.   There were a couple of areas of fatty tissue around the latissimus.  They were taken out that I thought had some node involvement.  I  carefully irrigated and made sure everything was dry.  I palpated high  up into the axilla and could not really feel any other nodes going up  alongside the vein superiorly.  However, just above the axillary vein,  there was a single 1-cm hard node, which I dissected out and took as a  single node and this was actually higher than the vein.  I did not do  any further dissection up there, but I thought this was an involved node  and wanted to get it out for staging purposes as well.   I spent several minutes irrigating and making sure everything was dry.  I put two #19 Blake drains and secured them with 2-0 nylons.  I  irrigated it again and did a final check for hemostasis and things  appeared dry.   The incision was then closed with staples.  We then changed instruments,  gowns, gloves and turned my attention to the left side.  The patient was  placed in Trendelenburg.  The left subclavian vein was entered on the  initial attempt and the guidewire was threaded and superior vena cava  was checked fluoroscopically.  I made a medial incision to perform a  pocket as the patient has very large breasts.  I thought this would be  the most appropriate area where we would get the Port-A-Cath reservoir  in without too much tissue between the skin and the reservoir and a good  back of the chest to compress against when they accessed it.  Once the  pocket was fashioned, I brought the tubing from the access site into the  guidewire site.   The guidewire site was  dilated once with a dilator and peel-away sheath.  The dilator and guidewire were removed and the catheter was threaded to  about 25 cm.  The peel-away sheath was removed.  It aspirated and  flushed easily.   Using fluoro, I could see apparently I  was in the superior vena cava,  right atrial area and I backed it up to 20 to approximately 19 cm where  I appeared to be above the right atrium, but below the bifurcation of  the trachea.  I again aspirated and flushed easily.  I flushed the  reservoir, attached it, and engaged the locking mechanism and this  aspirated and flushed easily.   The reservoir was sutured to the fascia with 2-0 Prolene using 3  sutures.  I then did a final fluoro and again we appeared to have good  positioning with no kinks.  I did put some contrast into the catheter  for each of the fluoro as the patient's obesity made it difficult to see  the catheter without some contrast in it.   At this point, I flushed the catheter with some dilute heparin followed  by concentrated aqueous heparin.  I then closed the incision with 3-0  Vicryl, 4-0 Monocryl subcuticular, and Dermabond.   Sterile dressings were applied to the mastectomy site.  There had been  no some bleeding at all during the period of time we worked on the [employer] and the flaps looked healthy.   The patient tolerated the procedure well.  There were no operative  complications.  All counts were correct.  Estimated blood loss was about  200 mL.      [name], M.D.  Electronically Signed     CJS/MEDQ  D:  08/11/2008  T:  08/12/2008  Job:  [id number]   cc:   Lazaro Arms, M.D.  Ladona Horns. Mariel Sleet, MD  [name], M.D.

## 2011-04-30 ENCOUNTER — Other Ambulatory Visit: Payer: Self-pay | Admitting: Obstetrics & Gynecology

## 2011-04-30 DIAGNOSIS — Z139 Encounter for screening, unspecified: Secondary | ICD-10-CM

## 2011-05-09 ENCOUNTER — Encounter (HOSPITAL_COMMUNITY): Payer: Medicare Other | Attending: Oncology

## 2011-05-09 ENCOUNTER — Other Ambulatory Visit (HOSPITAL_COMMUNITY): Payer: Self-pay | Admitting: Oncology

## 2011-05-09 DIAGNOSIS — E119 Type 2 diabetes mellitus without complications: Secondary | ICD-10-CM | POA: Insufficient documentation

## 2011-05-09 DIAGNOSIS — C50919 Malignant neoplasm of unspecified site of unspecified female breast: Secondary | ICD-10-CM | POA: Insufficient documentation

## 2011-05-09 DIAGNOSIS — C7952 Secondary malignant neoplasm of bone marrow: Secondary | ICD-10-CM

## 2011-05-09 DIAGNOSIS — Z79899 Other long term (current) drug therapy: Secondary | ICD-10-CM | POA: Insufficient documentation

## 2011-05-09 DIAGNOSIS — C7951 Secondary malignant neoplasm of bone: Secondary | ICD-10-CM | POA: Insufficient documentation

## 2011-05-09 LAB — COMPREHENSIVE METABOLIC PANEL
ALT: 12 U/L (ref 0–35)
AST: 20 U/L (ref 0–37)
Albumin: 3.9 g/dL (ref 3.5–5.2)
Calcium: 9.8 mg/dL (ref 8.4–10.5)
Chloride: 102 mEq/L (ref 96–112)
Creatinine, Ser: 0.73 mg/dL (ref 0.4–1.2)
GFR calc Af Amer: >60 mL/min (ref >60)
Sodium: 136 mEq/L (ref 135–145)

## 2011-05-15 ENCOUNTER — Encounter (HOSPITAL_COMMUNITY): Payer: Self-pay | Admitting: *Deleted

## 2011-05-22 ENCOUNTER — Encounter (HOSPITAL_COMMUNITY)
Admission: RE | Admit: 2011-05-22 | Discharge: 2011-05-22 | Disposition: A | Discharge status: Home / Self Care | Payer: Medicare Other | Source: Ambulatory Visit | Attending: Oncology | Admitting: Oncology

## 2011-05-22 ENCOUNTER — Other Ambulatory Visit (HOSPITAL_COMMUNITY): Payer: Self-pay | Admitting: Oncology

## 2011-05-22 ENCOUNTER — Encounter (HOSPITAL_COMMUNITY): Payer: Self-pay | Admitting: Oncology

## 2011-05-22 ENCOUNTER — Encounter (HOSPITAL_COMMUNITY): Payer: Self-pay

## 2011-05-22 DIAGNOSIS — Z9010 Acquired absence of unspecified breast and nipple: Secondary | ICD-10-CM | POA: Insufficient documentation

## 2011-05-22 DIAGNOSIS — C7951 Secondary malignant neoplasm of bone: Secondary | ICD-10-CM | POA: Insufficient documentation

## 2011-05-22 DIAGNOSIS — Z79899 Other long term (current) drug therapy: Secondary | ICD-10-CM | POA: Insufficient documentation

## 2011-05-22 DIAGNOSIS — C50919 Malignant neoplasm of unspecified site of unspecified female breast: Secondary | ICD-10-CM

## 2011-05-22 HISTORY — DX: Malignant neoplasm of unspecified site of unspecified female breast: C50.919

## 2011-05-22 LAB — GLUCOSE, CAPILLARY: Glucose-Capillary: 119 mg/dL — ABNORMAL HIGH (ref 70–99)

## 2011-05-22 MED ORDER — FLUDEOXYGLUCOSE F - 18 (FDG) INJECTION
18.5000 | Freq: Once | INTRAVENOUS | Status: AC | PRN
  Administered 2011-05-22: 18.5 via INTRAVENOUS

## 2011-05-23 ENCOUNTER — Ambulatory Visit (HOSPITAL_COMMUNITY): Payer: Self-pay | Admitting: Oncology

## 2011-05-24 ENCOUNTER — Ambulatory Visit (HOSPITAL_COMMUNITY): Payer: Medicare Other

## 2011-06-12 ENCOUNTER — Ambulatory Visit (HOSPITAL_COMMUNITY): Payer: Medicare Other

## 2011-06-12 ENCOUNTER — Other Ambulatory Visit (HOSPITAL_COMMUNITY): Payer: Self-pay | Admitting: Obstetrics & Gynecology

## 2011-06-12 ENCOUNTER — Ambulatory Visit (HOSPITAL_COMMUNITY)
Admission: RE | Admit: 2011-06-12 | Discharge: 2011-06-12 | Disposition: A | Discharge status: Home / Self Care | Payer: Medicare Other | Source: Ambulatory Visit | Attending: Obstetrics & Gynecology | Admitting: Obstetrics & Gynecology

## 2011-06-12 DIAGNOSIS — Z139 Encounter for screening, unspecified: Secondary | ICD-10-CM

## 2011-06-12 DIAGNOSIS — R599 Enlarged lymph nodes, unspecified: Secondary | ICD-10-CM | POA: Insufficient documentation

## 2011-06-12 DIAGNOSIS — Z853 Personal history of malignant neoplasm of breast: Secondary | ICD-10-CM | POA: Insufficient documentation

## 2011-06-13 ENCOUNTER — Ambulatory Visit (HOSPITAL_COMMUNITY): Payer: Medicare Other

## 2011-06-13 ENCOUNTER — Ambulatory Visit (HOSPITAL_COMMUNITY): Payer: Self-pay

## 2011-06-14 ENCOUNTER — Encounter (HOSPITAL_COMMUNITY): Payer: Medicare Other | Attending: Oncology | Admitting: Oncology

## 2011-06-14 ENCOUNTER — Ambulatory Visit (HOSPITAL_COMMUNITY)
Admission: RE | Admit: 2011-06-14 | Discharge: 2011-06-14 | Disposition: A | Discharge status: Home / Self Care | Payer: Medicare Other | Source: Ambulatory Visit | Attending: Oncology | Admitting: Oncology

## 2011-06-14 ENCOUNTER — Encounter (HOSPITAL_COMMUNITY): Payer: Self-pay | Admitting: Oncology

## 2011-06-14 VITALS — BP 138/77 | HR 101 | Temp 98.1°F | Wt 209.0 lb

## 2011-06-14 DIAGNOSIS — C7951 Secondary malignant neoplasm of bone: Secondary | ICD-10-CM | POA: Insufficient documentation

## 2011-06-14 DIAGNOSIS — M7989 Other specified soft tissue disorders: Secondary | ICD-10-CM

## 2011-06-14 DIAGNOSIS — C50919 Malignant neoplasm of unspecified site of unspecified female breast: Secondary | ICD-10-CM

## 2011-06-14 DIAGNOSIS — C7952 Secondary malignant neoplasm of bone marrow: Secondary | ICD-10-CM

## 2011-06-14 DIAGNOSIS — Z853 Personal history of malignant neoplasm of breast: Secondary | ICD-10-CM | POA: Insufficient documentation

## 2011-06-14 DIAGNOSIS — M79609 Pain in unspecified limb: Secondary | ICD-10-CM | POA: Insufficient documentation

## 2011-06-14 MED ORDER — TAMOXIFEN CITRATE 20 MG PO TABS: 20.0000 mg | ORAL_TABLET | Freq: Every day | ORAL | Status: DC

## 2011-06-14 NOTE — Progress Notes (Signed)
This office note has been dictated.

## 2011-06-14 NOTE — Progress Notes (Signed)
CC:   Kristen Parker, M.D. [name], M.D., Ph.D. [name], M.D.  Her CSN number is [id number].  Her doctor is Dr. Trey Sailors at [facility].  DIAGNOSIS: 1. Metastatic lobular cancer of the right breast with 26 of 29     positive nodes and multiple bony metastases.  She has now failed     Femara which was started on 09/05/2008, had a great response to it     but again has progressed.  She is status post radiation to the     right chest wall area and axilla and to the T6 area. 2. Constipation, much improved. 3. Intermittent paresthesias of her back. 4. C5-C6 spine fusion in the past. 5. Mild lymphedema of the right arm which has remained stable lately. 6. Paravertebral muscle spasms in the past. 7. Chronic low back discomfort at L5-S1.  Kristen Parker's cancer marker had increased.  It had risen to 50 from its lowest of 19-21 last summer of 2011, but it has been gradually rising.  It would fall ever so slightly and then rise further and with her most recent PET scan done the other day, she clearly has progression in her bones in 2 places, possibly 3.  They are in the lumbar sacral spine area and pubic ramus and iliac wing.  They are all left-sided essentially.  Her C-MET, however, is stable.  Her CBC is stable.  She has only the complaints today of the pain in her low back and left inguinal area and she had swelling last week on vacation while at the beach in the left leg on several days, not every day but several days, just in her thigh and upper calf.  There is no swelling there that is obvious today, but she is a little tender and a little tight in the calf but there is no increased warmth so we did do Dopplers which were totally negative for any evidence for DVT.  I think what is explaining the pain is the pubic bone involvement and possibly low back involvement.  PHYSICAL EXAMINATION:  Her exam otherwise shows that her weight is 209 pounds, up several pounds from June.   She is afebrile, pulse right around 100 and regular, respirations 18 and unlabored.  She has no peripheral leg edema as I mentioned.  The arm is swollen on the right. She has a sleeve on.  The right chest wall though is clear.  The left breast is negative for any masses.  Her lungs are clear.  Heart:  Shows regular rhythm and rate without murmur, rub or gallop.  Abdomen:  Soft and nontender without organomegaly.  Bowel sounds are diminished but present.  PLAN:  The plan is to change her to tamoxifen 20 mg once a day.  She did ask about some of the antidepressants and tamoxifen's effectiveness but she is fine to take her Effexor.  Her plan going forward, to continue the Zometa.  We will also check monthly CA 27-29 and we will probably have to do a PET scan some time in the next 3-6 months but I will see her in 12 weeks before we make that final decision.  She was accompanied today by her husband, her son Kristen Parker, who is the youngest, and her oldest son Kristen Parker.  We will see her back as planned.  We talked for at least 45 minutes to an hour overall for this clinic visit.  We will see her as scheduled.    ______________________________  Ladona Horns. Mariel Sleet, MD ESN/MEDQ  D:  06/14/2011  T:  06/14/2011  Job:  [id number]

## 2011-06-14 NOTE — Patient Instructions (Signed)
[facility]  Discharge Instructions  RECOMMENDATIONS MADE BY THE CONSULTANT AND ANY TEST RESULTS WILL BE SENT TO YOUR REFERRING DOCTOR.   EXAM FINDINGS BY MD TODAY AND SIGNS AND SYMPTOMS TO REPORT TO CLINIC OR PRIMARY MD: Dopplers done NO DVT  MEDICATIONS PRESCRIBED: Tamoxifen 20 mg a day Follow label directions  INSTRUCTIONS GIVEN AND DISCUSSED: Other Zometa as scheduled  SPECIAL INSTRUCTIONS/FOLLOW-UP: Return to Clinic on  3 months   I acknowledge that I have been informed and understand all the instructions given to me and received a copy. I do not have any more questions at this time, but understand that I may call the Specialty Clinic at [facility] at [phone] during business hours should I have any further questions or need assistance in obtaining follow-up care.    __________________________________________  _____________  __________ Signature of Patient or Authorized Representative            Date                   Time    __________________________________________ Nurse's Signature

## 2011-06-17 ENCOUNTER — Other Ambulatory Visit (HOSPITAL_COMMUNITY): Payer: Self-pay | Admitting: Oncology

## 2011-06-17 DIAGNOSIS — C50919 Malignant neoplasm of unspecified site of unspecified female breast: Secondary | ICD-10-CM

## 2011-06-17 DIAGNOSIS — M7989 Other specified soft tissue disorders: Secondary | ICD-10-CM

## 2011-06-17 MED ORDER — TAMOXIFEN CITRATE 20 MG PO TABS: 20.0000 mg | ORAL_TABLET | Freq: Every day | ORAL | Status: AC

## 2011-06-19 ENCOUNTER — Ambulatory Visit (HOSPITAL_COMMUNITY): Payer: Medicare Other

## 2011-06-20 ENCOUNTER — Encounter (HOSPITAL_BASED_OUTPATIENT_CLINIC_OR_DEPARTMENT_OTHER): Payer: Medicare Other

## 2011-06-20 ENCOUNTER — Encounter (HOSPITAL_COMMUNITY): Payer: Self-pay | Admitting: Oncology

## 2011-06-20 ENCOUNTER — Ambulatory Visit (HOSPITAL_COMMUNITY): Payer: Self-pay

## 2011-06-20 ENCOUNTER — Other Ambulatory Visit (HOSPITAL_COMMUNITY): Payer: Self-pay | Admitting: Oncology

## 2011-06-20 VITALS — BP 120/60 | HR 74 | Temp 98.3°F

## 2011-06-20 DIAGNOSIS — C7951 Secondary malignant neoplasm of bone: Secondary | ICD-10-CM | POA: Insufficient documentation

## 2011-06-20 DIAGNOSIS — C50919 Malignant neoplasm of unspecified site of unspecified female breast: Secondary | ICD-10-CM

## 2011-06-20 DIAGNOSIS — C7952 Secondary malignant neoplasm of bone marrow: Secondary | ICD-10-CM

## 2011-06-20 HISTORY — DX: Secondary malignant neoplasm of bone: C79.51

## 2011-06-20 LAB — COMPREHENSIVE METABOLIC PANEL
AST: 18 U/L (ref 0–37)
Albumin: 3.4 g/dL — ABNORMAL LOW (ref 3.5–5.2)
BUN: 16 mg/dL (ref 6–23)
Creatinine, Ser: 0.54 mg/dL (ref 0.50–1.10)
Total Protein: 6.6 g/dL (ref 6.0–8.3)

## 2011-06-20 LAB — CANCER ANTIGEN 27.29: CA 27.29: 55 U/mL — ABNORMAL HIGH (ref 0–39)

## 2011-06-20 MED ORDER — SODIUM CHLORIDE 0.9 % IV SOLN
Freq: Once | INTRAVENOUS | Status: AC
  Administered 2011-06-20: 16:00:00 via INTRAVENOUS

## 2011-06-20 MED ORDER — ZOLEDRONIC ACID 4 MG/5ML IV CONC
4.0000 mg | Freq: Once | INTRAVENOUS | Status: AC
  Administered 2011-06-20: 4 mg via INTRAVENOUS
  Filled 2011-06-20: qty 5

## 2011-06-20 MED ORDER — HEPARIN SOD (PORK) LOCK FLUSH 100 UNIT/ML IV SOLN
INTRAVENOUS | Status: AC
  Administered 2011-06-20: 17:00:00
  Filled 2011-06-20: qty 5

## 2011-07-19 ENCOUNTER — Encounter (HOSPITAL_COMMUNITY): Payer: Medicare Other | Attending: Oncology

## 2011-07-19 DIAGNOSIS — C50519 Malignant neoplasm of lower-outer quadrant of unspecified female breast: Secondary | ICD-10-CM

## 2011-07-19 DIAGNOSIS — M7989 Other specified soft tissue disorders: Secondary | ICD-10-CM | POA: Insufficient documentation

## 2011-07-19 DIAGNOSIS — C7951 Secondary malignant neoplasm of bone: Secondary | ICD-10-CM | POA: Insufficient documentation

## 2011-07-19 DIAGNOSIS — C7952 Secondary malignant neoplasm of bone marrow: Secondary | ICD-10-CM | POA: Insufficient documentation

## 2011-07-19 DIAGNOSIS — C50919 Malignant neoplasm of unspecified site of unspecified female breast: Secondary | ICD-10-CM | POA: Insufficient documentation

## 2011-07-19 LAB — BASIC METABOLIC PANEL
CO2: 25 mEq/L (ref 19–32)
Chloride: 106 mEq/L (ref 96–112)
Glucose, Bld: 156 mg/dL — ABNORMAL HIGH (ref 70–99)
Potassium: 3.7 mEq/L (ref 3.5–5.1)
Sodium: 139 mEq/L (ref 135–145)

## 2011-07-19 MED ORDER — HEPARIN SOD (PORK) LOCK FLUSH 100 UNIT/ML IV SOLN
500.0000 [IU] | Freq: Once | INTRAVENOUS | Status: AC | PRN
  Administered 2011-07-19: 500 [IU]

## 2011-07-19 MED ORDER — SODIUM CHLORIDE 0.9 % IV SOLN
Freq: Once | INTRAVENOUS | Status: AC
  Administered 2011-07-19: 13:00:00 via INTRAVENOUS

## 2011-07-19 MED ORDER — ZOLEDRONIC ACID 4 MG/5ML IV CONC
4.0000 mg | Freq: Once | INTRAVENOUS | Status: AC
  Administered 2011-07-19: 4 mg via INTRAVENOUS
  Filled 2011-07-19: qty 5

## 2011-07-19 MED ORDER — SODIUM CHLORIDE 0.9 % IV SOLN
1020.0000 mg | Freq: Once | INTRAVENOUS | Status: DC
  Filled 2011-07-19: qty 34

## 2011-07-19 MED ORDER — SODIUM CHLORIDE 0.9 % IJ SOLN: 10.0000 mL | INTRAMUSCULAR | Status: DC | PRN

## 2011-07-19 NOTE — Progress Notes (Signed)
Fereheme not given today.  Error in ordering.

## 2011-07-20 LAB — CANCER ANTIGEN 27.29: CA 27.29: 73 U/mL — ABNORMAL HIGH (ref 0–39)

## 2011-07-23 ENCOUNTER — Telehealth (HOSPITAL_COMMUNITY): Payer: Self-pay | Admitting: *Deleted

## 2011-07-23 ENCOUNTER — Other Ambulatory Visit (HOSPITAL_COMMUNITY): Payer: Self-pay | Admitting: *Deleted

## 2011-07-23 DIAGNOSIS — C50919 Malignant neoplasm of unspecified site of unspecified female breast: Secondary | ICD-10-CM

## 2011-07-23 NOTE — Telephone Encounter (Signed)
Message copied by Dennie Maizes on Tue Jul 23, 2011 10:46 AM ------      Message from: Mariel Sleet, ERIC S      Created: Mon Jul 22, 2011  5:20 PM       Repeat in one month and to see me.

## 2011-07-23 NOTE — Telephone Encounter (Signed)
Spoke with Ajla, informed that cancer marker up to 73. We will recheck marker and labs at appt for zometa. Changed appt with Dr.Neijstrom to Sept 21st. Pt understands.

## 2011-08-16 ENCOUNTER — Encounter (HOSPITAL_COMMUNITY): Payer: Medicare Other | Attending: Oncology

## 2011-08-16 DIAGNOSIS — M7989 Other specified soft tissue disorders: Secondary | ICD-10-CM | POA: Insufficient documentation

## 2011-08-16 DIAGNOSIS — E039 Hypothyroidism, unspecified: Secondary | ICD-10-CM | POA: Insufficient documentation

## 2011-08-16 DIAGNOSIS — C50919 Malignant neoplasm of unspecified site of unspecified female breast: Secondary | ICD-10-CM | POA: Insufficient documentation

## 2011-08-16 DIAGNOSIS — C7951 Secondary malignant neoplasm of bone: Secondary | ICD-10-CM | POA: Insufficient documentation

## 2011-08-16 DIAGNOSIS — C7952 Secondary malignant neoplasm of bone marrow: Secondary | ICD-10-CM | POA: Insufficient documentation

## 2011-08-16 LAB — DIFFERENTIAL
Basophils Absolute: 0 10*3/uL (ref 0.0–0.1)
Lymphocytes Relative: 28 % (ref 12–46)
Lymphs Abs: 2 10*3/uL (ref 0.7–4.0)
Monocytes Absolute: 0.7 10*3/uL (ref 0.1–1.0)
Monocytes Relative: 9 % (ref 3–12)
Neutro Abs: 4.4 10*3/uL (ref 1.7–7.7)

## 2011-08-16 LAB — COMPREHENSIVE METABOLIC PANEL
AST: 19 U/L (ref 0–37)
CO2: 27 mEq/L (ref 19–32)
Chloride: 102 mEq/L (ref 96–112)
Creatinine, Ser: 0.63 mg/dL (ref 0.50–1.10)
GFR calc non Af Amer: >60 mL/min (ref >60)
Glucose, Bld: 88 mg/dL (ref 70–99)
Total Bilirubin: 0.3 mg/dL (ref 0.3–1.2)

## 2011-08-16 LAB — CBC
HCT: 37.6 % (ref 36.0–46.0)
Hemoglobin: 12.8 g/dL (ref 12.0–15.0)
RBC: 4.05 MIL/uL (ref 3.87–5.11)
RDW: 13.3 % (ref 11.5–15.5)
WBC: 7.2 10*3/uL (ref 4.0–10.5)

## 2011-08-16 MED ORDER — SODIUM CHLORIDE 0.9 % IV SOLN
Freq: Once | INTRAVENOUS | Status: AC
  Administered 2011-08-16: 15:00:00 via INTRAVENOUS

## 2011-08-16 MED ORDER — HEPARIN SOD (PORK) LOCK FLUSH 100 UNIT/ML IV SOLN
INTRAVENOUS | Status: AC
  Administered 2011-08-16: 500 [IU]
  Filled 2011-08-16: qty 5

## 2011-08-16 MED ORDER — HEPARIN SOD (PORK) LOCK FLUSH 100 UNIT/ML IV SOLN
500.0000 [IU] | Freq: Once | INTRAVENOUS | Status: AC | PRN
  Administered 2011-08-16: 500 [IU]
  Filled 2011-08-16: qty 5

## 2011-08-16 MED ORDER — SODIUM CHLORIDE 0.9 % IJ SOLN
INTRAMUSCULAR | Status: AC
  Administered 2011-08-16: 10 mL
  Filled 2011-08-16: qty 10

## 2011-08-16 MED ORDER — SODIUM CHLORIDE 0.9 % IJ SOLN
10.0000 mL | INTRAMUSCULAR | Status: DC | PRN
  Administered 2011-08-16: 10 mL
  Filled 2011-08-16: qty 10

## 2011-08-16 MED ORDER — ZOLEDRONIC ACID 4 MG/5ML IV CONC
4.0000 mg | Freq: Once | INTRAVENOUS | Status: AC
  Administered 2011-08-16: 4 mg via INTRAVENOUS
  Filled 2011-08-16: qty 5

## 2011-08-16 NOTE — Progress Notes (Signed)
Tolerated zometa infusion well. 

## 2011-08-17 LAB — CANCER ANTIGEN 27.29: CA 27.29: 72 U/mL — ABNORMAL HIGH (ref 0–39)

## 2011-08-23 ENCOUNTER — Encounter (HOSPITAL_BASED_OUTPATIENT_CLINIC_OR_DEPARTMENT_OTHER): Payer: Medicare Other | Admitting: Oncology

## 2011-08-23 VITALS — BP 116/73 | HR 80 | Temp 98.2°F | Ht 65.0 in | Wt 212.8 lb

## 2011-08-23 DIAGNOSIS — C50919 Malignant neoplasm of unspecified site of unspecified female breast: Secondary | ICD-10-CM

## 2011-08-23 DIAGNOSIS — Z170 Estrogen receptor positive status [ER+]: Secondary | ICD-10-CM

## 2011-08-23 DIAGNOSIS — E039 Hypothyroidism, unspecified: Secondary | ICD-10-CM

## 2011-08-23 NOTE — Progress Notes (Signed)
CC:   Kristen Parker, M.D. Kristen Parker, M.D., Ph.D. Kristen Parker, M.D. Kristen Parker, M.D.  DIAGNOSIS:  Metastatic lobular carcinoma, widespread to bones. She also had 26 and 29 positive nodes at the time of presentation with a 4.8 cm primary.  She is status post mastectomy followed by radiation therapy to T6 vertebral body by Dr. Mitzi Hansen. She has also had ER positive 85%, PR positive 45% KI 67 marker was 68%,  Her-2/neu was negative, but it was felt to be a grade I cancer.  Her surgery was on 08/11/2008.  It appears that she received radiation to the thoracic spine in the T6 through T11 area.  She is here today for followup. She has developed worsening edema of the right arm and has been referred, of course, to the lymphedema specialist. Has her pump and also her arm compression stocking. She does this on a very religious daily basis.  She has also been more emotionally distraught since her mother died within the last couple of months.  Her brother is now going to move away to the [place] part of the state and a lot of these things are coming to bear on  her. The good news she has had lately is that her son is definitely getting married in the near future, I think that is 9 months away.  She, however, is still gaining some weight.  Some of it is fluid.  She does not have pitting edema.  Her right arm is much more swollen than I recall. It is much larger. I do not feel any adenopathy in any location including axillary, supraclavicular, infraclavicular or cervical areas. Her lungs today are clear.  Heart shows a regular rhythm and rate. Chest wall on the right is clear.  Left breast is negative.  Abdomen shows no hepatosplenomegaly.  Bowel sounds are normal. She has again no leg edema, no left arm edema. She is alert and oriented, but very tearful.  She was wondering whether the tamoxifen was causing all this, but I really think that it is a combination of her cancer  recently getting worse several months ago, switching her to the tamoxifen, her mother dying, her brother is now moving, the farm that she grew up on had to be sold, and so many things are coming to bear on  her. She has always tried to suppress a lot of these feelings.  She is Parker to go to the beach this week.  We are going to see her back right after she gets back with a PET scan, lab work and then I am going to go over things with her.  I will see her on the 3rd of October. We spent about 40 minutes going over what we need to do, what we are going to try to assess and that she is going to try to keep on the Tamoxifen for right now. So most of the encounter was spent counseling. She is going to keep  using her valium for right now. I do not think I want to add anything else to that at this juncture.    ______________________________ Ladona Horns. Mariel Sleet, MD ESN/MEDQ  D:  08/23/2011  T:  08/23/2011  Job:  [id number]

## 2011-08-23 NOTE — Patient Instructions (Signed)
[facility]  Discharge Instructions  RECOMMENDATIONS MADE BY THE CONSULTANT AND ANY TEST RESULTS WILL BE SENT TO YOUR REFERRING DOCTOR.   EXAM FINDINGS BY MD TODAY AND SIGNS AND SYMPTOMS TO REPORT TO CLINIC OR PRIMARY MD:  Exam per Dr. Mariel Sleet  MEDICATIONS PRESCRIBED:  Continue tamoxifen   SPECIAL INSTRUCTIONS/FOLLOW-UP: Lab work Needed  On Oct 1st, pet scan Oct 2nd, Oct 3rd. To see Neijstrom.   I acknowledge that I have been informed and understand all the instructions given to me and received a copy. I do not have any more questions at this time, but understand that I may call the Specialty Clinic at [facility] at [phone] during business hours should I have any further questions or need assistance in obtaining follow-up care.    __________________________________________  _____________  __________ Signature of Patient or Authorized Representative            Date                   Time    __________________________________________ Nurse's Signature

## 2011-08-23 NOTE — Progress Notes (Signed)
This office note has been dictated.

## 2011-08-26 ENCOUNTER — Ambulatory Visit (HOSPITAL_COMMUNITY): Payer: Medicare Other | Admitting: Oncology

## 2011-08-28 ENCOUNTER — Other Ambulatory Visit (HOSPITAL_COMMUNITY): Payer: Medicare Other

## 2011-09-03 ENCOUNTER — Encounter (HOSPITAL_COMMUNITY)
Admission: RE | Admit: 2011-09-03 | Discharge: 2011-09-03 | Disposition: A | Discharge status: Home / Self Care | Payer: Medicare Other | Source: Ambulatory Visit | Attending: Oncology | Admitting: Oncology

## 2011-09-03 DIAGNOSIS — N1330 Unspecified hydronephrosis: Secondary | ICD-10-CM | POA: Insufficient documentation

## 2011-09-03 DIAGNOSIS — C50919 Malignant neoplasm of unspecified site of unspecified female breast: Secondary | ICD-10-CM

## 2011-09-03 DIAGNOSIS — R911 Solitary pulmonary nodule: Secondary | ICD-10-CM | POA: Insufficient documentation

## 2011-09-03 DIAGNOSIS — C7951 Secondary malignant neoplasm of bone: Secondary | ICD-10-CM | POA: Insufficient documentation

## 2011-09-03 LAB — COMPREHENSIVE METABOLIC PANEL
ALT: 21
ALT: 35
AST: 26
AST: 30
Albumin: 3.6
Albumin: 3.7
Alkaline Phosphatase: 280 — ABNORMAL HIGH
Alkaline Phosphatase: 386 — ABNORMAL HIGH
BUN: 15
CO2: 24
Calcium: 9.3
Calcium: 8.8
Chloride: 106
Creatinine, Ser: 0.74
GFR calc Af Amer: >60
GFR calc Af Amer: >60
GFR calc non Af Amer: >60
Glucose, Bld: 157 — ABNORMAL HIGH
Glucose, Bld: 166 — ABNORMAL HIGH
Potassium: 3.8
Potassium: 3.6
Sodium: 138
Sodium: 137
Total Bilirubin: 0.5
Total Protein: 6.7
Total Protein: 6.5

## 2011-09-03 LAB — DIFFERENTIAL
Basophils Absolute: 0.1
Basophils Relative: 1
Eosinophils Absolute: 0.1
Eosinophils Relative: 1
Lymphocytes Relative: 35
Lymphs Abs: 3.5
Monocytes Absolute: 0.8
Monocytes Relative: 8
Neutro Abs: 5.7
Neutrophils Relative %: 56

## 2011-09-03 LAB — CBC
HCT: 36.5
MCHC: 35.4
MCV: 95.5
Platelets: 287
RBC: 3.83 — ABNORMAL LOW
WBC: 10.1

## 2011-09-03 LAB — GLUCOSE, CAPILLARY: Glucose-Capillary: 120 mg/dL — ABNORMAL HIGH (ref 70–99)

## 2011-09-03 LAB — CANCER ANTIGEN 27.29
CA 27.29: 164 — ABNORMAL HIGH
CA 27.29: 87 — ABNORMAL HIGH

## 2011-09-03 MED ORDER — FLUDEOXYGLUCOSE F - 18 (FDG) INJECTION
15.0000 | Freq: Once | INTRAVENOUS | Status: AC | PRN
  Administered 2011-09-03: 15 via INTRAVENOUS

## 2011-09-04 ENCOUNTER — Encounter (HOSPITAL_COMMUNITY): Payer: Medicare Other | Attending: Oncology

## 2011-09-04 DIAGNOSIS — E8779 Other fluid overload: Secondary | ICD-10-CM | POA: Insufficient documentation

## 2011-09-04 DIAGNOSIS — C7951 Secondary malignant neoplasm of bone: Secondary | ICD-10-CM | POA: Insufficient documentation

## 2011-09-04 DIAGNOSIS — C7952 Secondary malignant neoplasm of bone marrow: Secondary | ICD-10-CM | POA: Insufficient documentation

## 2011-09-04 DIAGNOSIS — E039 Hypothyroidism, unspecified: Secondary | ICD-10-CM

## 2011-09-04 DIAGNOSIS — C50919 Malignant neoplasm of unspecified site of unspecified female breast: Secondary | ICD-10-CM | POA: Insufficient documentation

## 2011-09-04 LAB — DIFFERENTIAL
Basophils Relative: 0
Eosinophils Absolute: 0.1
Eosinophils Relative: 1
Monocytes Absolute: 0.6
Monocytes Relative: 5

## 2011-09-04 LAB — URINALYSIS, ROUTINE W REFLEX MICROSCOPIC
Bilirubin Urine: NEGATIVE
Hgb urine dipstick: NEGATIVE
Ketones, ur: NEGATIVE
Protein, ur: NEGATIVE
Specific Gravity, Urine: 1.016
Urobilinogen, UA: 0.2

## 2011-09-04 LAB — COMPREHENSIVE METABOLIC PANEL
ALT: 28
AST: 27
Albumin: 3.7
Alkaline Phosphatase: 69 U/L (ref 39–117)
Alkaline Phosphatase: 137 — ABNORMAL HIGH
BUN: 16 mg/dL (ref 6–23)
Creatinine, Ser: 0.65 mg/dL (ref 0.50–1.10)
GFR calc Af Amer: >90 mL/min (ref >90)
Glucose, Bld: 121 mg/dL — ABNORMAL HIGH (ref 70–99)
Potassium: 3.8 mEq/L (ref 3.5–5.1)
Potassium: 4.1
Sodium: 141
Total Bilirubin: 0.3 mg/dL (ref 0.3–1.2)
Total Protein: 6.5 g/dL (ref 6.0–8.3)
Total Protein: 6.8

## 2011-09-04 LAB — TSH: TSH: 0.618 u[IU]/mL (ref 0.350–4.500)

## 2011-09-04 LAB — CBC
HCT: 37.9 % (ref 36.0–46.0)
Hemoglobin: 12.8 g/dL (ref 12.0–15.0)
Hemoglobin: 13.6
MCHC: 33.8 g/dL (ref 30.0–36.0)
MCV: 93.6 fL (ref 78.0–100.0)
Platelets: 296
RDW: 13.3
WBC: 11.6 — ABNORMAL HIGH

## 2011-09-04 LAB — URINE MICROSCOPIC-ADD ON

## 2011-09-04 NOTE — Progress Notes (Signed)
Labs drawn today for cbc,cmp,tsh,[id number]

## 2011-09-05 LAB — CANCER ANTIGEN 27.29: CA 27.29: 70 U/mL — ABNORMAL HIGH (ref 0–39)

## 2011-09-06 LAB — CBC
HCT: 38.7 % (ref 36.0–46.0)
Platelets: 239 10*3/uL (ref 150–400)
WBC: 7.7 10*3/uL (ref 4.0–10.5)

## 2011-09-06 LAB — DIFFERENTIAL
Basophils Absolute: 0 10*3/uL (ref 0.0–0.1)
Basophils Relative: 1 % (ref 0–1)
Eosinophils Absolute: 0.1 10*3/uL (ref 0.0–0.7)
Eosinophils Relative: 1 % (ref 0–5)
Monocytes Absolute: 0.7 10*3/uL (ref 0.1–1.0)
Neutro Abs: 5.5 10*3/uL (ref 1.7–7.7)

## 2011-09-06 LAB — COMPREHENSIVE METABOLIC PANEL
AST: 20 U/L (ref 0–37)
Albumin: 3.6 g/dL (ref 3.5–5.2)
Alkaline Phosphatase: 244 U/L — ABNORMAL HIGH (ref 39–117)
BUN: 14 mg/dL (ref 6–23)
Chloride: 105 mEq/L (ref 96–112)
Potassium: 3.6 mEq/L (ref 3.5–5.1)
Total Bilirubin: 0.4 mg/dL (ref 0.3–1.2)

## 2011-09-06 LAB — CANCER ANTIGEN 27.29: CA 27.29: 33 U/mL (ref 0–39)

## 2011-09-10 ENCOUNTER — Encounter (HOSPITAL_BASED_OUTPATIENT_CLINIC_OR_DEPARTMENT_OTHER): Payer: Medicare Other | Admitting: Oncology

## 2011-09-10 VITALS — BP 131/76 | HR 90 | Temp 98.4°F | Wt 214.4 lb

## 2011-09-10 DIAGNOSIS — C7952 Secondary malignant neoplasm of bone marrow: Secondary | ICD-10-CM

## 2011-09-10 DIAGNOSIS — E119 Type 2 diabetes mellitus without complications: Secondary | ICD-10-CM

## 2011-09-10 DIAGNOSIS — C50919 Malignant neoplasm of unspecified site of unspecified female breast: Secondary | ICD-10-CM

## 2011-09-10 DIAGNOSIS — R609 Edema, unspecified: Secondary | ICD-10-CM

## 2011-09-10 MED ORDER — TRIAMTERENE-HCTZ 37.5-25 MG PO CAPS: 1.0000 | ORAL_CAPSULE | ORAL | Status: DC

## 2011-09-10 MED ORDER — MEGESTROL ACETATE 40 MG PO TABS: 40.0000 mg | ORAL_TABLET | Freq: Every day | ORAL | Status: AC

## 2011-09-10 NOTE — Progress Notes (Signed)
This office note has been dictated.

## 2011-09-10 NOTE — Patient Instructions (Signed)
[facility]  Discharge Instructions  RECOMMENDATIONS MADE BY THE CONSULTANT AND ANY TEST RESULTS WILL BE SENT TO YOUR REFERRING DOCTOR.   EXAM FINDINGS BY MD TODAY AND SIGNS AND SYMPTOMS TO REPORT TO CLINIC OR PRIMARY MD: continue tamoxifen  MEDICATIONS PRESCRIBED: Megace & Dyazide Follow label directions  INSTRUCTIONS GIVEN AND DISCUSSED: Other :  Report any lumps, increased bone pain or shortness of breath.  SPECIAL INSTRUCTIONS/FOLLOW-UP: Lab work Needed: monthly, see Dr. Mariel Sleet in 4 months and PT consult.   I acknowledge that I have been informed and understand all the instructions given to me and received a copy. I do not have any more questions at this time, but understand that I may call the Specialty Clinic at [facility] at [phone] during business hours should I have any further questions or need assistance in obtaining follow-up care.    __________________________________________  _____________  __________ Signature of Patient or Authorized Representative            Date                   Time    __________________________________________ Nurse's Signature

## 2011-09-11 ENCOUNTER — Ambulatory Visit (HOSPITAL_COMMUNITY): Payer: Medicare Other | Admitting: Oncology

## 2011-09-11 ENCOUNTER — Telehealth (HOSPITAL_COMMUNITY): Payer: Self-pay | Admitting: *Deleted

## 2011-09-11 NOTE — Progress Notes (Signed)
CC:   Kristen Parker, M.D. Kristen Parker, M.D. Kristen Parker, M.D. Kristen Parker, M.D., Ph.D.  DIAGNOSES: 1. Metastatic lobular cancer of the right breast with multiple bone     mets, presented with a 4.8 cm primary and 26 of 29 positive nodes.     She is now on tamoxifen therapy having failed Femara.  She is     status post mastectomy on 08/11/2008.  She was treated with     radiation therapy to the right chest wall and axilla as well as to     the T6 area due to disease progression in that area. 2. Obesity with a body mass index of 35.5. 3. Paresthesias left lateral foot when she turns her foot in a certain     direction. 4. Diabetes mellitus. 5. Moderate to severe lymphedema of the right arm which has worsened.     We will get a lymphedema consult again for her to see if there is     anything else we can do. 6. Paravertebral muscle spasms in the past. 7. Chronic low back discomfort at L5 secondary to metastatic disease     as well as anterolisthesis of L5 on S1. 8. Chronic constipation which is much improved. Kristen Parker actually had a good week at the beach, came back, actually had a PET scan done on 09/03/2011.  She had blood work again which showed stability of her cancer marker at 70.  It is up from 55 in July but the value on the 17th of August was 73, the value on the 14th of September was 15 and now it is 42.  Her biggest complaint today is hot flashes which are severe, not really being held by the Effexor, but she thinks the Effexor is helping a little with depression so we will continue the tamoxifen and Effexor but had Megace 40 mg a day since she states this is really bothering her significantly.  She drips wet down her back and chest from the head and neck area.  She is not sleeping as well.  She is less depressed since we had a long talk about the death of her mother and other significant family members in the recent past and I think she is finally in touch with  some of those feelings that she had denied herself for quite some time.  Her other blood work on the 3rd of October was actually very good. Glucose was 121, TSH was also perfectly normal at 0.618.  We therefore do not need to adjust her Synthroid dose.  We need to just continue the tamoxifen.  I am going to add Megace 40 mg as I mentioned above, and I am also going to start her on Dyazide basically one 3 times a week to see if that will help with some of the fluid retention since she has gained a couple more pounds.  Her husband is with her today.  I went over the PET scan with her which shows no new disease, very stable disease since June, and so we are just going to watch her for the next four months with ongoing use of tamoxifen.  She is going to let me know whether the Megace helps.  She is going to let me know whether the Dyazide helps.  She also did show me on physical exam the tender area just below the left knee which I think is where she has pulled her quadriceps a little bit away from  the insertion site at the tibia, and a little skin tag on the back which is bothering her.  It is totally benign.  She can have it off if she needs to.  We will see her as I mentioned.    ______________________________ Ladona Horns. Mariel Sleet, MD ESN/MEDQ  D:  09/10/2011  T:  09/11/2011  Job:  [id number]

## 2011-09-11 NOTE — Telephone Encounter (Signed)
Dr. Mariel Sleet -  Patient was prescribed Maxide from [place] and Lisinopril from another MD. Pharmacist just wanted to be sure that you were aware that the drug interaction from the combo of these 2 drugs could be hyperkalemia. We are monitoring CMETs monthly d/t Zometa anyway. Are you ok with this drug interaction and our monthly intervention?

## 2011-09-11 NOTE — Telephone Encounter (Signed)
Dr. Neijstrom -  Patient was prescribed Maxide from us and Lisinopril from another MD. Pharmacist just wanted to be sure that you were aware that the drug interaction from the combo of these 2 drugs could be hyperkalemia. We are monitoring CMETs monthly d/t Zometa anyway. Are you ok with this drug interaction and our monthly intervention?   

## 2011-09-12 ENCOUNTER — Telehealth (HOSPITAL_COMMUNITY): Payer: Self-pay | Admitting: *Deleted

## 2011-09-12 NOTE — Telephone Encounter (Signed)
In regards to Kristen Parker and her Lisinopril and Dyazide drug interaction. She is not on HCTZ with her Lisinopril. She told the pharmacist that she probably wouldn't take the Dyazide but twice a week. So unless there is anything else to do - she will take her Lisinopril and Dyazide together.

## 2011-09-13 ENCOUNTER — Encounter (HOSPITAL_BASED_OUTPATIENT_CLINIC_OR_DEPARTMENT_OTHER): Payer: Medicare Other

## 2011-09-13 DIAGNOSIS — C7951 Secondary malignant neoplasm of bone: Secondary | ICD-10-CM

## 2011-09-13 DIAGNOSIS — C50919 Malignant neoplasm of unspecified site of unspecified female breast: Secondary | ICD-10-CM

## 2011-09-13 MED ORDER — SODIUM CHLORIDE 0.9 % IV SOLN
Freq: Once | INTRAVENOUS | Status: AC
  Administered 2011-09-13: 13:00:00 via INTRAVENOUS

## 2011-09-13 MED ORDER — HEPARIN SOD (PORK) LOCK FLUSH 100 UNIT/ML IV SOLN
INTRAVENOUS | Status: AC
  Administered 2011-09-13: 500 [IU]
  Filled 2011-09-13: qty 5

## 2011-09-13 MED ORDER — ZOLEDRONIC ACID 4 MG/5ML IV CONC
4.0000 mg | Freq: Once | INTRAVENOUS | Status: AC
  Administered 2011-09-13: 4 mg via INTRAVENOUS
  Filled 2011-09-13: qty 5

## 2011-09-13 MED ORDER — HEPARIN SOD (PORK) LOCK FLUSH 100 UNIT/ML IV SOLN
500.0000 [IU] | Freq: Once | INTRAVENOUS | Status: AC | PRN
  Administered 2011-09-13: 500 [IU]
  Filled 2011-09-13: qty 5

## 2011-09-13 NOTE — Progress Notes (Signed)
Tolerated infusion well. 

## 2011-09-16 ENCOUNTER — Ambulatory Visit (HOSPITAL_COMMUNITY)
Admission: RE | Admit: 2011-09-16 | Discharge: 2011-09-16 | Disposition: A | Discharge status: Home / Self Care | Payer: Medicare Other | Source: Ambulatory Visit | Attending: Oncology | Admitting: Oncology

## 2011-09-16 DIAGNOSIS — IMO0001 Reserved for inherently not codable concepts without codable children: Secondary | ICD-10-CM | POA: Insufficient documentation

## 2011-09-16 DIAGNOSIS — I972 Postmastectomy lymphedema syndrome: Secondary | ICD-10-CM | POA: Insufficient documentation

## 2011-09-16 NOTE — Progress Notes (Signed)
Physical Therapy Evaluation  Patient Details  Name: Kristen Parker MRN: [id number] Date of Birth: 09-22-1951  Today's Date: 09/16/2011 Time: 9562-1308 Time Calculation (min): 35 min Visit#: 1  of 12   Re-eval: 10/16/11 Assessment Diagnosis: lymphedema  Surgical Date: 05/17/11 Next MD Visit:  (Jan.) Prior Therapy:  (two previous times)  Past Medical History:  Past Medical History  Diagnosis Date  . Diabetes mellitus   . Graves disease   . Cancer     breast  . Breast ca 05/22/2011  . Thyroid disease     Graves disease/iodine ablation 16 yrs ago  . Hypertension   . Radiation   . Bone metastases     T-spine  . Bone metastases 06/20/2011   Past Surgical History:  Past Surgical History  Procedure Date  . Breast biopsy     left  . Axillary node dissection     biopsy  . Breast biopsy     right  . Eye surgery     laser retinal tears  . Acdf   . Cervical disc surgery     ant. fusion with plate and screws  . Cholecystectomy   . Cesarean section   . Tubal ligation   . Reproductive     Removal of rt reproductive system  . Dilatation and currettage   . Mastectomy     rt. radical  29 lymph nodes    Subjective Symptoms/Limitations Symptoms: This patient is well known to this clinic.  This is the third time she has been referred for lymphedema.  She states that she has been using her compression garments, trying to do the self massages and using the joblst pump for an hour at least once a day and most the time she is able to get two times a day in.  She states she noticed that her left UE was increasing in size in September.  She continued to work with it but she states at this point she felt she needed assistance.  The patient states that when she woke up this morning her left arm was actually quite a bit smaller than it had been. The patient continues to work out at [employer] in the pool keeping her ROM and strength wnl. Pain Assessment Currently in Pain?: Yes Pain Score:    4 Pain Location: Axilla Pain Orientation: Left Pain Type: Chronic pain Pain Radiating Towards: chest Pain Onset: More than a month ago Pain Frequency: Intermittent Pain Relieving Factors: decreasing fluid  Objective:  Pt's measurements were taken there is a 3,511 cc difference of fluid when comparing the L to the R UE.       Physical Therapy Assessment and Plan PT Assessment and Plan Clinical Impression Statement: Pt with lymphedema following a radical masectomy.  Pt will benefit from skilled therapy to decrease pain and improve the patient's quality of life. Rehab Potential: Good Clinical Impairments Affecting Rehab Potential: lymphedema PT Frequency: Min 3X/week PT Duration: 4 weeks PT Plan: see pt 3x week for lymph massage to decrease volume of fluid in the UE    Goals PT Short Term Goals Time to Complete Short Term Goals: 2 weeks PT Short Term Goal 1: decrease volume by 50% PT Long Term Goals Time to Complete Long Term Goals: 4 weeks PT Long Term Goal 1: decrease volume by 80%  Problem List Patient Active Problem List  Diagnoses  . ACHILLES TENDON TEAR  . Breast ca  . Bone metastases    PT - End of  Session Activity Tolerance: Patient tolerated treatment well General Behavior During Session: [facility] for tasks performed   RUSSELL,CINDY 09/16/2011, 5:56 PM  Physician Documentation Your signature is required to indicate approval of the treatment plan as stated above.  Please sign and either send electronically or make a copy of this report for your files and return this physician signed original.   Please mark one 1.__approve of plan  2. ___approve of plan with the following conditions.   ______________________________                                                          _____________________ Physician Signature                                                                                                             Date

## 2011-09-18 ENCOUNTER — Ambulatory Visit (HOSPITAL_COMMUNITY)
Admission: RE | Admit: 2011-09-18 | Discharge: 2011-09-18 | Disposition: A | Discharge status: Home / Self Care | Payer: Medicare Other | Source: Ambulatory Visit | Attending: Family Medicine | Admitting: Family Medicine

## 2011-09-18 NOTE — Progress Notes (Signed)
Physical Therapy Treatment Patient Details  Name: Kristen Parker MRN: [id number] Date of Birth: May 24, 1951  Today's Date: 09/18/2011 Time: 4098-1191 Time Calculation (min): 47 min Visit#: 2  of 12   Re-eval: 10/16/11 Charges:  Manual 42'    Subjective:  Pt. Reports tightness in entire upper body, especially across back and chest.  Using her compression pump and sleeve at night when sleeping.  No pain today.    Exercise/Treatments  Manual Therapy Manual Therapy:  (Manual Lymph Drainage)  Physical Therapy Assessment and Plan PT Assessment and Plan Clinical Impression Statement: Pt. reported immediate relief/decreased tightness entire upper body following manual lymph drainage. PT Treatment/Interventions:  (manual lymph drainage) PT Plan: Continue to progress toward goals.     Problem List Patient Active Problem List  Diagnoses  . ACHILLES TENDON TEAR  . Breast ca  . Bone metastases    PT - End of Session Activity Tolerance: Patient tolerated treatment well General Behavior During Session: [facility] for tasks performed Cognition: [facility] for tasks performed  Emeline Gins B 09/18/2011, 1:42 PM

## 2011-09-19 ENCOUNTER — Ambulatory Visit (HOSPITAL_COMMUNITY): Payer: Medicare Other | Admitting: Physical Therapy

## 2011-09-23 ENCOUNTER — Ambulatory Visit (HOSPITAL_COMMUNITY)
Admission: RE | Admit: 2011-09-23 | Discharge: 2011-09-23 | Disposition: A | Discharge status: Home / Self Care | Payer: Medicare Other | Source: Ambulatory Visit | Attending: Oncology | Admitting: Oncology

## 2011-09-23 NOTE — Progress Notes (Signed)
Physical Therapy Treatment Patient Details  Name: Kristen Parker MRN: [id number] Date of Birth: December 28, 1950  Today's Date: 09/23/2011 Time: 4098-1191 Time Calculation (min): 45 min Visit#: 3  of 12   Re-eval: 10/16/11 Charges:  Manual 40 minutes   Subjective: Symptoms: Pt. states she has been using her compression pump at home.  No pain reported today.  Exercise/Treatments Manual Lymph Drainage to Right upper extremity  Physical Therapy Assessment and Plan PT Assessment and Plan Clinical Impression Statement: Continued reduction in fluid/tightness following treatment. PT Plan: Continue per POC.     Problem List Patient Active Problem List  Diagnoses  . ACHILLES TENDON TEAR  . Breast ca  . Bone metastases    PT - End of Session Activity Tolerance: Patient tolerated treatment well General Behavior During Session: [name] for tasks performed Cognition: [name] for tasks performed  [name] 09/23/2011, 4:21 PM

## 2011-09-25 ENCOUNTER — Ambulatory Visit (HOSPITAL_COMMUNITY)
Admission: RE | Admit: 2011-09-25 | Discharge: 2011-09-25 | Disposition: A | Discharge status: Home / Self Care | Payer: Medicare Other | Source: Ambulatory Visit | Attending: Family Medicine | Admitting: Family Medicine

## 2011-09-25 NOTE — Progress Notes (Signed)
Physical Therapy Treatment Patient Details  Name: Kristen Parker MRN: [id number] Date of Birth: 1951/06/13  Today's Date: 09/25/2011 Time: 0936-1020 Time Calculation (min): 44 min Visit#: 4  of 12   Re-eval: 10/16/11 Diagnosis: lymphedema  Charges:  Manual therapy 40'  Subjective: Symptoms/Limitations Symptoms: Pt. reports she's been getting alot of additional relief with the manual massage. Pain Assessment Currently in Pain?: No/denies (Mostly has pain at night in elbow and scapular regions.)  Exercise/Treatments Manual Therapy Other Manual Therapy: Manual Lymph Drainage  Physical Therapy Assessment and Plan PT Assessment and Plan Clinical Impression Statement: Progressing with overall edema reduction. PT Treatment/Interventions:  (manual lymph drainage) PT Plan: Continue per POC.    Problem List Patient Active Problem List  Diagnoses  . ACHILLES TENDON TEAR  . Breast ca  . Bone metastases    PT - End of Session Activity Tolerance: Patient tolerated treatment well General Behavior During Session: [facility] for tasks performed Cognition: [facility] for tasks performed  Emeline Gins B 09/25/2011, 10:55 AM

## 2011-09-30 ENCOUNTER — Ambulatory Visit (HOSPITAL_COMMUNITY)
Admission: RE | Admit: 2011-09-30 | Discharge: 2011-09-30 | Disposition: A | Discharge status: Home / Self Care | Payer: Medicare Other | Source: Ambulatory Visit | Attending: Family Medicine | Admitting: Family Medicine

## 2011-09-30 NOTE — Progress Notes (Signed)
Physical Therapy Treatment Patient Details  Name: Kristen Parker MRN: [id number] Date of Birth: 1951/07/05  Today's Date: 09/30/2011 Time: 7253-6644 Time Calculation (min): 41 min Visit#: 5  of 12   Re-eval: 10/16/11 Charges:  Manual therapy 40'    Subjective: Symptoms/Limitations Symptoms: Pt. states she uses her home compression pump twice daily for an hour.  States she has some discomfort today around her elbow.  Exercise/Treatments  Manual Therapy Other Manual Therapy: Manual Lymph Drainage to Right Upper Extremity.  Physical Therapy Assessment and Plan PT Assessment and Plan Clinical Impression Statement: Continued progression with edema reduction for R UE and thorax. PT Plan: Continue per POC    Problem List Patient Active Problem List  Diagnoses  . ACHILLES TENDON TEAR  . Breast ca  . Bone metastases    PT - End of Session Activity Tolerance: Patient tolerated treatment well General Behavior During Session: [facility] for tasks performed Cognition: [facility] for tasks performed  Emeline Gins B 09/30/2011, 12:05 PM

## 2011-10-02 ENCOUNTER — Ambulatory Visit (HOSPITAL_COMMUNITY)
Admission: RE | Admit: 2011-10-02 | Discharge: 2011-10-02 | Disposition: A | Discharge status: Home / Self Care | Payer: Medicare Other | Source: Ambulatory Visit | Attending: Family Medicine | Admitting: Family Medicine

## 2011-10-02 NOTE — Progress Notes (Signed)
Physical Therapy Treatment Patient Details  Name: MAIMUNA LEAMAN MRN: [id number] Date of Birth: 08/30/1951  Today's Date: 10/02/2011 Time: 0865-7846 Time Calculation (min): 48 min Visit#: 6  of 12   Re-eval: 10/16/11 Charges:  MLD manual 45'    Subjective: Symptoms/Limitations Symptoms: Pt. states she will be at the beach all next week.  States her back has been huring worse since yesterday. Pain Assessment Currently in Pain?: Yes Pain Score:   2 Pain Location:  (elbow and scap regions)    Exercise/Treatments Manual Therapy Other Manual Therapy: Manual Lymph Drainage to Right Upper Extremity  Physical Therapy Assessment and Plan PT Assessment and Plan Clinical Impression Statement: Pt. with increased edema/thickness in posterior Right UE but drastically reduced with MLD. PT Plan: Measure next visit prior to 2 week leave.  Continue MLD.     Problem List Patient Active Problem List  Diagnoses  . ACHILLES TENDON TEAR  . Breast ca  . Bone metastases    PT - End of Session Activity Tolerance: Patient tolerated treatment well General Behavior During Session: [facility] for tasks performed Cognition: [facility] for tasks performed  Bascom Levels, Mickie Badders B 10/02/2011, 11:00 AM

## 2011-10-03 ENCOUNTER — Ambulatory Visit (HOSPITAL_COMMUNITY)
Admission: RE | Admit: 2011-10-03 | Discharge: 2011-10-03 | Disposition: A | Discharge status: Home / Self Care | Payer: Medicare Other | Source: Ambulatory Visit | Attending: Family Medicine | Admitting: Family Medicine

## 2011-10-03 DIAGNOSIS — I972 Postmastectomy lymphedema syndrome: Secondary | ICD-10-CM | POA: Insufficient documentation

## 2011-10-03 DIAGNOSIS — IMO0001 Reserved for inherently not codable concepts without codable children: Secondary | ICD-10-CM | POA: Insufficient documentation

## 2011-10-03 NOTE — Progress Notes (Signed)
Physical Therapy Treatment Patient Details  Name: MILANYA SUNDERLAND MRN: [id number] Date of Birth: 1951-01-08  Today's Date: 10/03/2011 Time: 5621-3086 Time Calculation (min): 48 min Visit#: 7  of 12   Re-eval: 10/16/11 Charges:  Manual therapy 45'    Subjective: Symptoms/Limitations Symptoms: Pt. states she felt much better yesterday and today.  States the massage makes a world of difference.  Leaving for the beach on Sunday. Pain Assessment Currently in Pain?: No/denies Pain Score:   1 Pain Location:  (Right upper scap) Pain Orientation: Right   Exercise/Treatments Manual Therapy Other Manual Therapy: Manual Lymph Drainage to Right Upper Extremity  Physical Therapy Assessment and Plan PT Assessment and Plan Clinical Impression Statement: Improved edema and thickness of R upper extremity. PT Plan: Continue; Re-measure upon return from vacation.     Problem List Patient Active Problem List  Diagnoses  . ACHILLES TENDON TEAR  . Breast ca  . Bone metastases    PT - End of Session Activity Tolerance: Patient tolerated treatment well General Behavior During Session: [facility] for tasks performed Cognition: [facility] for tasks performed  Emeline Gins B 10/03/2011, 11:58 AM

## 2011-10-04 ENCOUNTER — Other Ambulatory Visit (HOSPITAL_COMMUNITY): Payer: Self-pay | Admitting: Oncology

## 2011-10-04 DIAGNOSIS — F419 Anxiety disorder, unspecified: Secondary | ICD-10-CM

## 2011-10-04 DIAGNOSIS — C50919 Malignant neoplasm of unspecified site of unspecified female breast: Secondary | ICD-10-CM

## 2011-10-04 DIAGNOSIS — C7951 Secondary malignant neoplasm of bone: Secondary | ICD-10-CM

## 2011-10-04 MED ORDER — DIAZEPAM 5 MG PO TABS: 5.0000 mg | ORAL_TABLET | Freq: Three times a day (TID) | ORAL | Status: DC | PRN

## 2011-10-07 ENCOUNTER — Other Ambulatory Visit (HOSPITAL_COMMUNITY): Payer: Self-pay | Admitting: Oncology

## 2011-10-11 ENCOUNTER — Encounter (HOSPITAL_BASED_OUTPATIENT_CLINIC_OR_DEPARTMENT_OTHER): Payer: Medicare Other

## 2011-10-11 ENCOUNTER — Encounter (HOSPITAL_COMMUNITY): Payer: Medicare Other | Attending: Oncology

## 2011-10-11 ENCOUNTER — Ambulatory Visit (HOSPITAL_COMMUNITY): Payer: Medicare Other

## 2011-10-11 DIAGNOSIS — C7952 Secondary malignant neoplasm of bone marrow: Secondary | ICD-10-CM | POA: Insufficient documentation

## 2011-10-11 DIAGNOSIS — C50919 Malignant neoplasm of unspecified site of unspecified female breast: Secondary | ICD-10-CM

## 2011-10-11 DIAGNOSIS — C7951 Secondary malignant neoplasm of bone: Secondary | ICD-10-CM

## 2011-10-11 DIAGNOSIS — C50519 Malignant neoplasm of lower-outer quadrant of unspecified female breast: Secondary | ICD-10-CM

## 2011-10-11 LAB — COMPREHENSIVE METABOLIC PANEL
BUN: 14 mg/dL (ref 6–23)
Calcium: 9.6 mg/dL (ref 8.4–10.5)
Creatinine, Ser: 0.69 mg/dL (ref 0.50–1.10)
GFR calc Af Amer: >90 mL/min (ref >90)
Glucose, Bld: 106 mg/dL — ABNORMAL HIGH (ref 70–99)
Sodium: 136 mEq/L (ref 135–145)
Total Protein: 6.4 g/dL (ref 6.0–8.3)

## 2011-10-11 MED ORDER — ZOLEDRONIC ACID 4 MG/5ML IV CONC
4.0000 mg | Freq: Once | INTRAVENOUS | Status: AC
  Administered 2011-10-11: 4 mg via INTRAVENOUS
  Filled 2011-10-11: qty 5

## 2011-10-11 MED ORDER — HEPARIN SOD (PORK) LOCK FLUSH 100 UNIT/ML IV SOLN
500.0000 [IU] | Freq: Once | INTRAVENOUS | Status: AC | PRN
  Administered 2011-10-11: 500 [IU]
  Filled 2011-10-11: qty 5

## 2011-10-11 MED ORDER — SODIUM CHLORIDE 0.9 % IJ SOLN
10.0000 mL | INTRAMUSCULAR | Status: DC | PRN
  Filled 2011-10-11: qty 10

## 2011-10-11 MED ORDER — SODIUM CHLORIDE 0.9 % IV SOLN
Freq: Once | INTRAVENOUS | Status: AC
  Administered 2011-10-11: 13:00:00 via INTRAVENOUS

## 2011-10-11 MED ORDER — HEPARIN SOD (PORK) LOCK FLUSH 100 UNIT/ML IV SOLN
INTRAVENOUS | Status: AC
  Filled 2011-10-11: qty 5

## 2011-10-11 NOTE — Progress Notes (Signed)
Tolerated zometa infusion well.  Good blood return from port.

## 2011-10-14 ENCOUNTER — Encounter (HOSPITAL_COMMUNITY): Payer: Medicare Other

## 2011-10-14 DIAGNOSIS — C50919 Malignant neoplasm of unspecified site of unspecified female breast: Secondary | ICD-10-CM

## 2011-10-22 ENCOUNTER — Ambulatory Visit (HOSPITAL_COMMUNITY)
Admission: RE | Admit: 2011-10-22 | Discharge: 2011-10-22 | Disposition: A | Discharge status: Home / Self Care | Payer: Medicare Other | Source: Ambulatory Visit | Attending: Family Medicine | Admitting: Family Medicine

## 2011-10-22 NOTE — Progress Notes (Signed)
Physical Therapy Treatment Patient Details  Name: JACALYNN BUZZELL MRN: [id number] Date of Birth: February 07, 1951  Today's Date: 10/22/2011 Time: 4098-1191 Time Calculation (min): 45 min Visit#: 8  of 12   Re-eval: 10/23/11 Charges:  Manual 40'    Subjective: Symptoms/Limitations Symptoms: Pt. reports increased heaviness and swelling past 2 weeks without treatment.  Pt. just purchased compression bras that have helped with the swelling at inferior axilla and upper back. Pain Assessment Currently in Pain?: Yes Pain Score:   2  Exercise/Treatments  Manual Therapy Manual Therapy: Other (comment) Other Manual Therapy: Manual Lymph Drainage for non-intact R UE.  Physical Therapy Assessment and Plan PT Assessment and Plan Clinical Impression Statement: Increased swelling; to measure tomorrow.  Discussed possibility of bandaging R UE to get further decongestion if no relief from compression garments.   PT Plan: Measure tomorrow. continue CDT.     Problem List Patient Active Problem List  Diagnoses  . ACHILLES TENDON TEAR  . Breast ca  . Bone metastases    PT - End of Session Activity Tolerance: Patient tolerated treatment well General Behavior During Session: [facility] for tasks performed Cognition: [facility] for tasks performed  Emeline Gins B 10/22/2011, 11:15 AM

## 2011-10-23 ENCOUNTER — Ambulatory Visit (HOSPITAL_COMMUNITY)
Admission: RE | Admit: 2011-10-23 | Discharge: 2011-10-23 | Disposition: A | Discharge status: Home / Self Care | Payer: Medicare Other | Source: Ambulatory Visit | Attending: Family Medicine | Admitting: Family Medicine

## 2011-10-23 NOTE — Progress Notes (Signed)
Physical Therapy Treatment Patient Details  Name: Kristen Parker MRN: [id number] Date of Birth: 09-26-1951  Today's Date: 10/23/2011 Time: 4098-1191 Time Calculation (min): 45 min Visit#: 9  of 12   Re-eval: 10/28/11 Charges:  Manual 40 minutes    Subjective: Symptoms/Limitations Symptoms: Pt. states she had to urinate alot yesterday after treatment.  Pt. with noticable reduction in swelling in Right UE.  Pt. reports she is now taking another pain medication for her bone pain, especially her lower back. Pain Assessment Currently in Pain?: Yes Pain Score:   2  Precautions/Restrictions  Precautions Precaution Comments: stage 4 metastatic breast cancer with bone metastisis.  Exercise/Treatments  Manual Therapy Manual Therapy: Other (comment) Other Manual Therapy: Manual Lymph Drainage for R UE  Physical Therapy Assessment and Plan PT Assessment and Plan Clinical Impression Statement: Overall decreased swelling in Right UE.  Will wait and measure next visit, since patient has missed the past 2 weeks of therapy due to conflicts. PT Treatment/Interventions:  (Manual Lymph Drainage) PT Plan: RE-measure next visit.  Continue manual lymph drainage techniques.    Problem List Patient Active Problem List  Diagnoses  . ACHILLES TENDON TEAR  . Breast ca  . Bone metastases    PT - End of Session Activity Tolerance: Patient tolerated treatment well General Behavior During Session: [facility] for tasks performed Cognition: [facility] for tasks performed  Emeline Gins B 10/23/2011, 11:32 AM

## 2011-10-28 ENCOUNTER — Ambulatory Visit (HOSPITAL_COMMUNITY)
Admission: RE | Admit: 2011-10-28 | Discharge: 2011-10-28 | Disposition: A | Discharge status: Home / Self Care | Payer: Medicare Other | Source: Ambulatory Visit | Attending: Family Medicine | Admitting: Family Medicine

## 2011-10-28 NOTE — Progress Notes (Signed)
Physical Therapy Treatment Patient Details  Name: Kristen Parker MRN: [id number] Date of Birth: 03-01-1951  Today's Date: 10/28/2011 Time: 4098-1191 Time Calculation (min): 53 min Visit#: 10  of 24   Re-eval: 11/25/11 Charges:  Manual 45'    Subjective: Symptoms/Limitations Symptoms: Pt. reports her arm feels better than was when first started coming.  No pain today, but a little swollen from preparing Thanksgiving meal last week. Pain Assessment Currently in Pain?: No/denies  Precautions/Restrictions:  Stage 4 Breast Cancer with bone metastisis     Objective: Circumferential measurements taken today of R UE:  Volume of R UE has increased 88.68 cc, however pt had missed 2 weeks of therapy.  Date 09/16/2011 09/16/2011 10/28/2011   Left right right  0cm (wrist) 15.70 17.00 17.5  4cm 17.00 20.00 19.60  8cm 18.80 24.50 23.80  12cm 21.20 29.30 28.60  16cm 24.00 33.00 33.50  20cm 25.40 33.50 34.20  24cm 27.00 32.30 34.70  28cm 29.00 35.50 38.20  32cm 30.50 37.00 39.00  36cm 35.30 38.20 38.00  40cm 36.70 40.90 38.00                                          Sum of squares 7652.76 11163.58 11439.03  Total Volume 2435.95004 4782.95621 3086.57846   Exercise/Treatments  Manual Therapy Manual Therapy: Edema management Edema Management: Manual Lymph Drainage  Physical Therapy Assessment and Plan PT Assessment and Plan Clinical Impression Statement: Pt. with overall increase of 88.68 cc flulid in R UE, however pt. had missed 2 weeks secondary to vacation and has been busy using UE to cook over the Thanksgiving holiday. PT Frequency: Min 3X/week PT Duration: 4 weeks PT Treatment/Interventions:  (Manual Lymph Drainage) PT Plan: Continue MLD X 4 more weeks to progress toward goals.    Goals PT Short Term Goals Time to Complete Short Term Goals: 2 weeks PT Short Term Goal 1: decrease volume by 50% PT Short Term Goal 1 - Progress: Not met PT Long Term Goals Time to  Complete Long Term Goals: 4 weeks PT Long Term Goal 1: decrease volume by 80% PT Long Term Goal 1 - Progress: Not met  Problem List Patient Active Problem List  Diagnoses  . ACHILLES TENDON TEAR  . Breast ca  . Bone metastases    PT - End of Session Activity Tolerance: Patient tolerated treatment well General Behavior During Session: [facility] for tasks performed Cognition: [facility] for tasks performed  Emeline Gins B 10/28/2011, 11:22 AM

## 2011-10-30 ENCOUNTER — Ambulatory Visit (HOSPITAL_COMMUNITY)
Admission: RE | Admit: 2011-10-30 | Discharge: 2011-10-30 | Disposition: A | Discharge status: Home / Self Care | Payer: Medicare Other | Source: Ambulatory Visit | Attending: Family Medicine | Admitting: Family Medicine

## 2011-10-30 NOTE — Progress Notes (Signed)
Physical Therapy Treatment Patient Details  Name: Kristen Parker MRN: [id number] Date of Birth: 1951/03/31  Today's Date: 10/30/2011 Time: 8657-8469 Time Calculation (min): 49 min Visit#: 11  of 24   Re-eval: 11/25/11 Charges:  Manual 45'    Subjective:  Pt. Reports it does not feel as heavy today as it usually does.   Exercise/Treatments  Manual Therapy Manual Therapy: Edema management Other Manual Therapy: Manual Lymph Drainage for R UE  Physical Therapy Assessment and Plan PT Assessment and Plan Clinical Impression Statement: Less tightness/fullness noted in R UE today. PT Plan: Continue MLD, measure X 5 more visits.    Problem List Patient Active Problem List  Diagnoses  . ACHILLES TENDON TEAR  . Breast ca  . Bone metastases    PT - End of Session Activity Tolerance: Patient tolerated treatment well General Behavior During Session: [facility] for tasks performed Cognition: [facility] for tasks performed  Emeline Gins B 10/30/2011, 2:29 PM

## 2011-10-31 ENCOUNTER — Ambulatory Visit (HOSPITAL_COMMUNITY)
Admission: RE | Admit: 2011-10-31 | Discharge: 2011-10-31 | Disposition: A | Discharge status: Home / Self Care | Payer: Medicare Other | Source: Ambulatory Visit | Attending: Family Medicine | Admitting: Family Medicine

## 2011-10-31 NOTE — Progress Notes (Signed)
Physical Therapy Treatment Patient Details  Name: VIRNA LIVENGOOD MRN: [id number] Date of Birth: January 24, 1951  Today's Date: 10/31/2011 Time: 2130-8657 Time Calculation (min): 58 min Visit#: 12  of 24   Re-eval: 11/25/11 Charges:  Manual 48'    Subjective: Symptoms/Limitations Symptoms: Pt. reports compliance with clearing her nodes prior to using her pump. Pain Assessment Currently in Pain?: No/denies   Exercise/Treatments  Manual Therapy Manual Therapy: Edema management Other Manual Therapy: Manual Lymph Drainage  Physical Therapy Assessment and Plan PT Assessment and Plan Clinical Impression Statement: Extra time spent today around posterior elbow area due to concentrated lymph.  Overall decreased tightness/swelling following treatment. PT Plan: Continue; measure end of next week, X 3 more visits.    Problem List Patient Active Problem List  Diagnoses  . ACHILLES TENDON TEAR  . Breast ca  . Bone metastases    PT - End of Session Activity Tolerance: Patient tolerated treatment well General Behavior During Session: [facility] for tasks performed Cognition: [facility] for tasks performed  Emeline Gins B 10/31/2011, 10:50 AM

## 2011-11-04 ENCOUNTER — Ambulatory Visit (HOSPITAL_COMMUNITY)
Admission: RE | Admit: 2011-11-04 | Discharge: 2011-11-04 | Disposition: A | Discharge status: Home / Self Care | Payer: Medicare Other | Source: Ambulatory Visit | Attending: Family Medicine | Admitting: Family Medicine

## 2011-11-04 DIAGNOSIS — IMO0001 Reserved for inherently not codable concepts without codable children: Secondary | ICD-10-CM | POA: Insufficient documentation

## 2011-11-04 DIAGNOSIS — I972 Postmastectomy lymphedema syndrome: Secondary | ICD-10-CM | POA: Insufficient documentation

## 2011-11-04 NOTE — Progress Notes (Signed)
Physical Therapy Treatment Patient Details  Name: SAVANNA DOOLEY MRN: [id number] Date of Birth: 1951-03-28  Today's Date: 11/04/2011 Time: 4098-1191 Time Calculation (min): 39 min Visit#: 13  of 24   Re-eval: 11/25/11 Charges:  Manual 38'    Subjective: Symptoms/Limitations Symptoms: Pt. states she had less tightness in her arm over the weekend.  States she's been sleeping with her reid sleeve on for 1/2 the night; removes it due to discomfort. Pain Assessment Currently in Pain?: No/denies  Exercise/Treatments  Manual Therapy Manual Therapy: Edema management Other Manual Therapy: Manual Lymph Drainage for R UE.  Physical Therapy Assessment and Plan PT Assessment and Plan Clinical Impression Statement: Lymph continues to concentrate around posterior elbow/proximal R UE, however not as swollen as previously.  Pt. compliant with homecare. PT Plan: Continue Manual lymph drainage; measure end of this week, X 2 more visits.    Problem List Patient Active Problem List  Diagnoses  . ACHILLES TENDON TEAR  . Breast ca  . Bone metastases    PT - End of Session Activity Tolerance: Patient tolerated treatment well General Behavior During Session: [facility] for tasks performed Cognition: [facility] for tasks performed  Emeline Gins B 11/04/2011, 10:58 AM

## 2011-11-06 ENCOUNTER — Ambulatory Visit (HOSPITAL_COMMUNITY)
Admission: RE | Admit: 2011-11-06 | Discharge: 2011-11-06 | Disposition: A | Discharge status: Home / Self Care | Payer: Medicare Other | Source: Ambulatory Visit | Attending: Family Medicine | Admitting: Family Medicine

## 2011-11-06 NOTE — Progress Notes (Signed)
Physical Therapy Treatment Patient Details  Name: REANN DOBIAS MRN: [id number] Date of Birth: 1951/11/04  Today's Date: 11/06/2011 Time: 4098-1191 Time Calculation (min): 40 min Visit#: 14  of 24   Re-eval: 11/25/11 Charges:  Manual 38 minutes    Subjective: Symptoms/Limitations Symptoms: Pt. states she had to wash her sleeve yesterday and did not wear one all day; states she is more swollen today because of it.  Recommended pt. get another sleeve to alternate for cleanings. Pain Assessment Currently in Pain?: No/denies  Exercise/Treatments Manual Therapy Manual Therapy: Edema management Other Manual Therapy: Manual Lymph Drainage for R UE  Physical Therapy Assessment and Plan PT Assessment and Plan Clinical Impression Statement: Less congestion noted in thorax area but remains most concentrated in R Proximal LE/elbow area.  Good lymph movement produced with MLD today. PT Plan: Re-measure next visit.     Problem List Patient Active Problem List  Diagnoses  . ACHILLES TENDON TEAR  . Breast ca  . Bone metastases    PT - End of Session Activity Tolerance: Patient tolerated treatment well General Behavior During Session: [facility] for tasks performed Cognition: [facility] for tasks performed  Bascom Levels, Jori Thrall B 11/06/2011, 10:09 AM

## 2011-11-07 ENCOUNTER — Ambulatory Visit (HOSPITAL_COMMUNITY)
Admission: RE | Admit: 2011-11-07 | Discharge: 2011-11-07 | Disposition: A | Discharge status: Home / Self Care | Payer: Medicare Other | Source: Ambulatory Visit | Attending: Family Medicine | Admitting: Family Medicine

## 2011-11-07 NOTE — Progress Notes (Signed)
Physical Therapy Treatment Patient Details  Name: Kristen Parker MRN: [id number] Date of Birth: 1951/11/19  Today's Date: 11/07/2011 Time: 1100-1156 Time Calculation (min): 56 min Visit#: 15  of 24   Re-eval: 11/25/11 Charges:  Manual 48'     Subjective: Symptoms/Limitations Symptoms: Pt. states her back has been hurting more and she's been more tired lately.   Exercise/Treatments  Manual Therapy Other Manual Therapy: Manual Lymph Drainage for R UE  Physical Therapy Assessment and Plan PT Assessment and Plan Clinical Impression Statement: R UE measured with a fluid reduction of 72.67cc.  PT Plan: Continue X 2 more weeks.     Problem List Patient Active Problem List  Diagnoses  . ACHILLES TENDON TEAR  . Breast ca  . Bone metastases    PT - End of Session Activity Tolerance: Patient tolerated treatment well General Behavior During Session: [facility] for tasks performed Cognition: [facility] for tasks performed  Emeline Gins B 11/07/2011, 12:59 PM

## 2011-11-08 ENCOUNTER — Other Ambulatory Visit (HOSPITAL_COMMUNITY): Payer: Medicare Other

## 2011-11-08 ENCOUNTER — Encounter (HOSPITAL_COMMUNITY): Payer: Medicare Other | Attending: Oncology

## 2011-11-08 ENCOUNTER — Ambulatory Visit (HOSPITAL_COMMUNITY): Payer: Medicare Other

## 2011-11-08 VITALS — BP 129/75 | HR 90 | Temp 99.5°F

## 2011-11-08 DIAGNOSIS — C50919 Malignant neoplasm of unspecified site of unspecified female breast: Secondary | ICD-10-CM | POA: Insufficient documentation

## 2011-11-08 DIAGNOSIS — C7951 Secondary malignant neoplasm of bone: Secondary | ICD-10-CM | POA: Insufficient documentation

## 2011-11-08 DIAGNOSIS — M7989 Other specified soft tissue disorders: Secondary | ICD-10-CM

## 2011-11-08 DIAGNOSIS — C7952 Secondary malignant neoplasm of bone marrow: Secondary | ICD-10-CM

## 2011-11-08 LAB — BASIC METABOLIC PANEL
Calcium: 9.9 mg/dL (ref 8.4–10.5)
Creatinine, Ser: 0.74 mg/dL (ref 0.50–1.10)
GFR calc non Af Amer: >90 mL/min (ref >90)
Sodium: 137 mEq/L (ref 135–145)

## 2011-11-08 MED ORDER — SODIUM CHLORIDE 0.9 % IJ SOLN
10.0000 mL | INTRAMUSCULAR | Status: DC | PRN
  Filled 2011-11-08: qty 10

## 2011-11-08 MED ORDER — HEPARIN SOD (PORK) LOCK FLUSH 100 UNIT/ML IV SOLN
500.0000 [IU] | Freq: Once | INTRAVENOUS | Status: AC | PRN
  Administered 2011-11-08: 500 [IU]
  Filled 2011-11-08: qty 5

## 2011-11-08 MED ORDER — HEPARIN SOD (PORK) LOCK FLUSH 100 UNIT/ML IV SOLN
INTRAVENOUS | Status: AC
  Administered 2011-11-08: 500 [IU]
  Filled 2011-11-08: qty 5

## 2011-11-08 MED ORDER — SODIUM CHLORIDE 0.9 % IV SOLN
Freq: Once | INTRAVENOUS | Status: AC
  Administered 2011-11-08: 500 mL via INTRAVENOUS

## 2011-11-08 MED ORDER — ZOLEDRONIC ACID 4 MG/5ML IV CONC
4.0000 mg | Freq: Once | INTRAVENOUS | Status: AC
  Administered 2011-11-08: 4 mg via INTRAVENOUS
  Filled 2011-11-08: qty 5

## 2011-11-09 LAB — CANCER ANTIGEN 27.29: CA 27.29: 65 U/mL — ABNORMAL HIGH (ref 0–39)

## 2011-11-11 ENCOUNTER — Ambulatory Visit (HOSPITAL_COMMUNITY)
Admission: RE | Admit: 2011-11-11 | Discharge: 2011-11-11 | Disposition: A | Discharge status: Home / Self Care | Payer: Medicare Other | Source: Ambulatory Visit | Attending: Family Medicine | Admitting: Family Medicine

## 2011-11-11 NOTE — Progress Notes (Signed)
Physical Therapy Treatment Patient Details  Name: Kristen Parker MRN: [id number] Date of Birth: August 18, 1951  Today's Date: 11/11/2011 Time: 2130-8657 Time Calculation (min): 50 min Visit#: 16  of 24   Re-eval: 11/25/11 Charges:  Manual 48'    Subjective: Symptoms/Limitations Symptoms: Pt. states she could actually see her elbow all weekend; noticed the swelling was down. Pain Assessment Currently in Pain?: No/denies  Exercise/Treatments  Manual Therapy Manual Therapy: Edema management Edema Management: Manual Lymph Drainage for R UE  Physical Therapy Assessment and Plan PT Assessment and Plan Clinical Impression Statement: Noted reduction of lymph fluid in R UE and thoracic area. PT Plan: Continue MLD and progress toward goals.    Problem List Patient Active Problem List  Diagnoses  . ACHILLES TENDON TEAR  . Breast ca  . Bone metastases    PT - End of Session Activity Tolerance: Patient tolerated treatment well General Behavior During Session: [facility] for tasks performed Cognition: [facility] for tasks performed  Emeline Gins B 11/11/2011, 12:01 PM

## 2011-11-12 ENCOUNTER — Other Ambulatory Visit (HOSPITAL_COMMUNITY): Payer: Self-pay | Admitting: Oncology

## 2011-11-12 DIAGNOSIS — C7951 Secondary malignant neoplasm of bone: Secondary | ICD-10-CM

## 2011-11-12 DIAGNOSIS — C50919 Malignant neoplasm of unspecified site of unspecified female breast: Secondary | ICD-10-CM

## 2011-11-12 MED ORDER — HYDROCODONE-ACETAMINOPHEN 5-500 MG PO TABS: 1.0000 | ORAL_TABLET | ORAL | Status: DC | PRN

## 2011-11-13 ENCOUNTER — Ambulatory Visit (HOSPITAL_COMMUNITY)
Admission: RE | Admit: 2011-11-13 | Discharge: 2011-11-13 | Disposition: A | Discharge status: Home / Self Care | Payer: Medicare Other | Source: Ambulatory Visit | Attending: Family Medicine | Admitting: Family Medicine

## 2011-11-13 NOTE — Progress Notes (Signed)
Physical Therapy Treatment Patient Details  Name: NAKEDA LEBRON MRN: [id number] Date of Birth: 01-Feb-1951  Today's Date: 11/13/2011 Time: 6578-4696 Time Calculation (min): 45 min Visit#: 17  of 24   Re-eval: 11/25/11 Charges:  Manual 42'    Subjective:  Pt. States she's doing better; states her cancer markers were down 9 points this month.   Exercise/Treatments  Manual Therapy Edema Management: Manual Lymph Drainage for R UE  Physical Therapy Assessment and Plan PT Assessment and Plan Clinical Impression Statement: Pt. responding well to MLD PT Plan: Continue current POC    Problem List Patient Active Problem List  Diagnoses  . ACHILLES TENDON TEAR  . Breast ca  . Bone metastases    PT - End of Session Activity Tolerance: Patient tolerated treatment well General Behavior During Session: [facility] for tasks performed Cognition: [facility] for tasks performed  Bascom Levels, Tiajah Oyster B 11/13/2011, 7:00 PM

## 2011-11-14 ENCOUNTER — Telehealth (HOSPITAL_COMMUNITY): Payer: Self-pay | Admitting: Oncology

## 2011-11-14 ENCOUNTER — Ambulatory Visit (HOSPITAL_COMMUNITY)
Admission: RE | Admit: 2011-11-14 | Discharge: 2011-11-14 | Disposition: A | Discharge status: Home / Self Care | Payer: Medicare Other | Source: Ambulatory Visit | Attending: Family Medicine | Admitting: Family Medicine

## 2011-11-14 NOTE — Progress Notes (Signed)
Physical Therapy Treatment Patient Details  Name: Kristen Parker MRN: [id number] Date of Birth: [birth year]  Today's Date: 11/14/2011 Time: 7829-5621 Time Calculation (min): 50 min Visit#: 18  of 24   Re-eval: 11/25/11 Charges:  Manual 48'    Subjective: Symptoms/Limitations Symptoms: Pt. states she's going to [place] for the evening/coming back tomorrow.  States her sleeve is fitting better now. Pain Assessment Currently in Pain?: No/denies   Exercise/Treatments  Manual Therapy Edema Management: Manual Lymph Drainage for R UE  Physical Therapy Assessment and Plan PT Assessment and Plan Clinical Impression Statement: Lymph continues to decrease in R UE. PT Plan: Continue X 1 more week then re-evaluate (4 visits)     Problem List Patient Active Problem List  Diagnoses  . ACHILLES TENDON TEAR  . Breast ca  . Bone metastases    PT - End of Session Activity Tolerance: Patient tolerated treatment well General Behavior During Session: [facility] for tasks performed Cognition: [facility] for tasks performed  Emeline Gins B 11/14/2011, 9:59 AM

## 2011-11-18 ENCOUNTER — Ambulatory Visit (HOSPITAL_COMMUNITY): Payer: Medicare Other | Admitting: Physical Therapy

## 2011-11-20 ENCOUNTER — Ambulatory Visit (HOSPITAL_COMMUNITY)
Admission: RE | Admit: 2011-11-20 | Discharge: 2011-11-20 | Disposition: A | Discharge status: Home / Self Care | Payer: Medicare Other | Source: Ambulatory Visit | Attending: Family Medicine | Admitting: Family Medicine

## 2011-11-20 NOTE — Progress Notes (Signed)
Physical Therapy Treatment Patient Details  Name: Kristen Parker MRN: [id number] Date of Birth: [birth year]  Today's Date: 11/20/2011 Time: 7829-5621 Time Calculation (min): 43 min Visit#: 19  of 24   Re-eval: 11/25/11 Charges:  Manual 40'    Subjective: Symptoms/Limitations Symptoms: Pt. reports increased swelling/discomfort in elbow/posterior upper arm today; Pt. missed Monday's appt. due to therapist being out.  No pain per scale, just discomfort.   Exercise/Treatments  Manual Therapy Edema Management: Manual Lymph Drainage for R UE  Physical Therapy Assessment and Plan PT Assessment and Plan Clinical Impression Statement: Overall reduction of lymph fluid following session with decreased discomfort. PT Plan: Continue X 2 more weeks then re-evaluate.     Problem List Patient Active Problem List  Diagnoses  . ACHILLES TENDON TEAR  . Breast ca  . Bone metastases    PT - End of Session Activity Tolerance: Patient tolerated treatment well General Behavior During Session: [facility] for tasks performed Cognition: [facility] for tasks performed  Emeline Gins B 11/20/2011, 11:13 AM

## 2011-11-21 ENCOUNTER — Ambulatory Visit (HOSPITAL_COMMUNITY)
Admission: RE | Admit: 2011-11-21 | Discharge: 2011-11-21 | Disposition: A | Discharge status: Home / Self Care | Payer: Medicare Other | Source: Ambulatory Visit | Attending: Family Medicine | Admitting: Family Medicine

## 2011-11-21 NOTE — Progress Notes (Signed)
Physical Therapy Treatment Patient Details  Name: Kristen Parker MRN: [id number] Date of Birth: 01-Mar-1951  Today's Date: 11/21/2011 Time: 7829-5621 Time Calculation (min): 44 min Visit#: 20  of 24   Re-eval: 11/25/11 Charges:  Manual therapy 40 minutes  Subjective: Symptoms/Limitations Symptoms: Pt. states her arm was much improved last night and today.  States her elbow continues to be sore to the touch. Pain Assessment Currently in Pain?: No/denies  Exercise/Treatments Manual Therapy Manual Therapy: Edema management Edema Management: Manual Lymph Drainage for R UE    Physical Therapy Assessment and Plan PT Assessment and Plan Clinical Impression Statement: Overall improvement; has one visit remaining. PT Plan: Re-measure next visit.     Problem List Patient Active Problem List  Diagnoses  . ACHILLES TENDON TEAR  . Breast ca  . Bone metastases    PT - End of Session Activity Tolerance: Patient tolerated treatment well General Behavior During Session: [facility] for tasks performed Cognition: [facility] for tasks performed  [name] 11/21/2011, 4:45 PM

## 2011-11-25 ENCOUNTER — Ambulatory Visit (HOSPITAL_COMMUNITY)
Admission: RE | Admit: 2011-11-25 | Discharge: 2011-11-25 | Disposition: A | Discharge status: Home / Self Care | Payer: Medicare Other | Source: Ambulatory Visit | Attending: Family Medicine | Admitting: Family Medicine

## 2011-11-25 NOTE — Progress Notes (Signed)
Physical Therapy Treatment Patient Details  Name: Kristen Parker MRN: [id number] Date of Birth: 21-Oct-1951  Today's Date: 11/25/2011 Time: 1000-1052 Time Calculation (min): 52 min Visit#: 21  of 24   Re-eval: 11/25/11 Assessment Diagnosis: Secondary Lymphedema of R UE Charges:  Manual 48'  Subjective:  Pt. Reports overall discomfort since beginning therapy.  States she is compliant with her garments and HEP.    Precautions/Restrictions  Precaution Comments: stage 4 metastatic breast cancer with bone metastisis.   Objective: Measurements of R UE throughout course of Therapy    Exercise/Treatments  Manual Therapy Manual Therapy: Edema management Edema Management: Manual Lymph Drainage to L axillary and R inguinal nodes. Other Manual Therapy: Measurements taken today of R UE.  Physical Therapy Assessment and Plan PT Assessment and Plan Clinical Impression Statement: Pt. with 15% fluid reduction as compared to initial evaluation, fluid was reduced 13% 2 weeks ago.  Feel pt has met maximal benefits from MLD at this point.  Pt is able to complete self-maintenance independently and is compliant with compression garments. PT Plan: Recommend discharge to evaluating therapist.    Goals PT Short Term Goals Time to Complete Short Term Goals: 2 weeks PT Short Term Goal 1: decrease volume by 50% PT Long Term Goals Time to Complete Long Term Goals: 4 weeks PT Long Term Goal 1: decrease volume by 80%  Problem List Patient Active Problem List  Diagnoses  . ACHILLES TENDON TEAR  . Breast ca  . Bone metastases    PT - End of Session Activity Tolerance: Patient tolerated treatment well General Behavior During Session: [facility] for tasks performed Cognition: [facility] for tasks performed  Emeline Gins B 11/25/2011, 2:13 PM

## 2011-11-27 ENCOUNTER — Ambulatory Visit (HOSPITAL_COMMUNITY): Payer: Medicare Other | Admitting: Physical Therapy

## 2011-11-28 ENCOUNTER — Ambulatory Visit (HOSPITAL_COMMUNITY): Payer: Medicare Other | Admitting: Physical Therapy

## 2011-12-06 ENCOUNTER — Encounter (HOSPITAL_BASED_OUTPATIENT_CLINIC_OR_DEPARTMENT_OTHER): Payer: Medicare Other

## 2011-12-06 ENCOUNTER — Encounter (HOSPITAL_COMMUNITY): Payer: Medicare Other | Attending: Oncology

## 2011-12-06 DIAGNOSIS — C801 Malignant (primary) neoplasm, unspecified: Secondary | ICD-10-CM | POA: Insufficient documentation

## 2011-12-06 DIAGNOSIS — C7952 Secondary malignant neoplasm of bone marrow: Secondary | ICD-10-CM

## 2011-12-06 DIAGNOSIS — C50919 Malignant neoplasm of unspecified site of unspecified female breast: Secondary | ICD-10-CM | POA: Insufficient documentation

## 2011-12-06 DIAGNOSIS — C7951 Secondary malignant neoplasm of bone: Secondary | ICD-10-CM | POA: Insufficient documentation

## 2011-12-06 DIAGNOSIS — C50519 Malignant neoplasm of lower-outer quadrant of unspecified female breast: Secondary | ICD-10-CM

## 2011-12-06 LAB — COMPREHENSIVE METABOLIC PANEL
ALT: 12 U/L (ref 0–35)
AST: 18 U/L (ref 0–37)
Calcium: 10.1 mg/dL (ref 8.4–10.5)
Creatinine, Ser: 0.68 mg/dL (ref 0.50–1.10)
GFR calc Af Amer: >90 mL/min (ref >90)
Sodium: 134 mEq/L — ABNORMAL LOW (ref 135–145)
Total Protein: 6.6 g/dL (ref 6.0–8.3)

## 2011-12-06 MED ORDER — SODIUM CHLORIDE 0.9 % IV SOLN
Freq: Once | INTRAVENOUS | Status: AC
  Administered 2011-12-06: 12:00:00 via INTRAVENOUS

## 2011-12-06 MED ORDER — HEPARIN SOD (PORK) LOCK FLUSH 100 UNIT/ML IV SOLN
500.0000 [IU] | Freq: Once | INTRAVENOUS | Status: AC
Start: 2011-12-06 — End: 2011-12-06
  Administered 2011-12-06: 500 [IU] via INTRAVENOUS
  Filled 2011-12-06: qty 5

## 2011-12-06 MED ORDER — ZOLEDRONIC ACID 4 MG/5ML IV CONC
4.0000 mg | Freq: Once | INTRAVENOUS | Status: AC
  Administered 2011-12-06: 4 mg via INTRAVENOUS
  Filled 2011-12-06: qty 5

## 2011-12-06 MED ORDER — SODIUM CHLORIDE 0.9 % IJ SOLN
10.0000 mL | Freq: Once | INTRAMUSCULAR | Status: AC
  Administered 2011-12-06: 10 mL via INTRAVENOUS
  Filled 2011-12-06: qty 10

## 2011-12-06 NOTE — Progress Notes (Signed)
Tolerated well

## 2011-12-06 NOTE — Progress Notes (Signed)
See infusion encounter.  

## 2011-12-07 LAB — CANCER ANTIGEN 27.29: CA 27.29: 77 U/mL — ABNORMAL HIGH (ref 0–39)

## 2011-12-10 ENCOUNTER — Encounter (HOSPITAL_COMMUNITY): Payer: Medicare Other

## 2011-12-23 ENCOUNTER — Other Ambulatory Visit (HOSPITAL_COMMUNITY): Payer: Self-pay | Admitting: Oncology

## 2011-12-23 DIAGNOSIS — C50919 Malignant neoplasm of unspecified site of unspecified female breast: Secondary | ICD-10-CM

## 2011-12-23 DIAGNOSIS — C7951 Secondary malignant neoplasm of bone: Secondary | ICD-10-CM

## 2011-12-23 MED ORDER — HYDROCODONE-ACETAMINOPHEN 5-500 MG PO TABS: 1.0000 | ORAL_TABLET | ORAL | Status: DC | PRN

## 2012-01-03 ENCOUNTER — Other Ambulatory Visit (HOSPITAL_COMMUNITY): Payer: Self-pay | Admitting: Oncology

## 2012-01-03 ENCOUNTER — Encounter (HOSPITAL_COMMUNITY): Payer: Medicare Other | Attending: Family Medicine

## 2012-01-03 ENCOUNTER — Encounter (HOSPITAL_COMMUNITY): Payer: Medicare Other | Attending: Oncology

## 2012-01-03 DIAGNOSIS — C50919 Malignant neoplasm of unspecified site of unspecified female breast: Secondary | ICD-10-CM

## 2012-01-03 DIAGNOSIS — C801 Malignant (primary) neoplasm, unspecified: Secondary | ICD-10-CM | POA: Insufficient documentation

## 2012-01-03 DIAGNOSIS — C7952 Secondary malignant neoplasm of bone marrow: Secondary | ICD-10-CM | POA: Insufficient documentation

## 2012-01-03 DIAGNOSIS — C7951 Secondary malignant neoplasm of bone: Secondary | ICD-10-CM | POA: Insufficient documentation

## 2012-01-03 DIAGNOSIS — IMO0001 Reserved for inherently not codable concepts without codable children: Secondary | ICD-10-CM | POA: Insufficient documentation

## 2012-01-03 DIAGNOSIS — C50519 Malignant neoplasm of lower-outer quadrant of unspecified female breast: Secondary | ICD-10-CM

## 2012-01-03 DIAGNOSIS — F419 Anxiety disorder, unspecified: Secondary | ICD-10-CM

## 2012-01-03 DIAGNOSIS — M7989 Other specified soft tissue disorders: Secondary | ICD-10-CM | POA: Insufficient documentation

## 2012-01-03 DIAGNOSIS — I972 Postmastectomy lymphedema syndrome: Secondary | ICD-10-CM | POA: Insufficient documentation

## 2012-01-03 LAB — COMPREHENSIVE METABOLIC PANEL
ALT: 11 U/L (ref 0–35)
BUN: 16 mg/dL (ref 6–23)
CO2: 22 mEq/L (ref 19–32)
Calcium: 10.2 mg/dL (ref 8.4–10.5)
Creatinine, Ser: 0.67 mg/dL (ref 0.50–1.10)
GFR calc Af Amer: >90 mL/min (ref >90)
GFR calc non Af Amer: >90 mL/min (ref >90)
Glucose, Bld: 92 mg/dL (ref 70–99)

## 2012-01-03 LAB — CBC
HCT: 36.9 % (ref 36.0–46.0)
MCH: 33.2 pg (ref 26.0–34.0)
MCV: 93.7 fL (ref 78.0–100.0)
RBC: 3.94 MIL/uL (ref 3.87–5.11)
WBC: 8.1 10*3/uL (ref 4.0–10.5)

## 2012-01-03 LAB — DIFFERENTIAL
Eosinophils Relative: 2 % (ref 0–5)
Lymphocytes Relative: 16 % (ref 12–46)
Lymphs Abs: 1.3 10*3/uL (ref 0.7–4.0)
Monocytes Absolute: 0.9 10*3/uL (ref 0.1–1.0)
Monocytes Relative: 11 % (ref 3–12)

## 2012-01-03 MED ORDER — DIAZEPAM 5 MG PO TABS: 5.0000 mg | ORAL_TABLET | Freq: Three times a day (TID) | ORAL | Status: DC | PRN

## 2012-01-03 MED ORDER — ZOLEDRONIC ACID 4 MG/5ML IV CONC
4.0000 mg | Freq: Once | INTRAVENOUS | Status: AC
  Administered 2012-01-03: 4 mg via INTRAVENOUS
  Filled 2012-01-03: qty 5

## 2012-01-03 MED ORDER — HEPARIN SOD (PORK) LOCK FLUSH 100 UNIT/ML IV SOLN
500.0000 [IU] | Freq: Once | INTRAVENOUS | Status: AC
  Administered 2012-01-03: 500 [IU] via INTRAVENOUS
  Filled 2012-01-03: qty 5

## 2012-01-03 MED ORDER — SODIUM CHLORIDE 0.9 % IJ SOLN
INTRAMUSCULAR | Status: AC
  Filled 2012-01-03: qty 10

## 2012-01-03 MED ORDER — SODIUM CHLORIDE 0.9 % IV SOLN
INTRAVENOUS | Status: DC
  Administered 2012-01-03: 13:00:00 via INTRAVENOUS

## 2012-01-03 MED ORDER — HEPARIN SOD (PORK) LOCK FLUSH 100 UNIT/ML IV SOLN
INTRAVENOUS | Status: AC
  Filled 2012-01-03: qty 5

## 2012-01-04 LAB — CANCER ANTIGEN 27.29: CA 27.29: 74 U/mL — ABNORMAL HIGH (ref 0–39)

## 2012-01-07 ENCOUNTER — Other Ambulatory Visit (HOSPITAL_COMMUNITY): Payer: Medicare Other

## 2012-01-07 ENCOUNTER — Encounter (HOSPITAL_COMMUNITY): Payer: Medicare Other

## 2012-01-10 ENCOUNTER — Ambulatory Visit (HOSPITAL_COMMUNITY): Payer: Medicare Other | Admitting: Oncology

## 2012-01-14 ENCOUNTER — Encounter (HOSPITAL_BASED_OUTPATIENT_CLINIC_OR_DEPARTMENT_OTHER): Payer: Medicare Other | Admitting: Oncology

## 2012-01-14 VITALS — BP 152/79 | HR 62 | Temp 98.2°F | Wt 212.9 lb

## 2012-01-14 DIAGNOSIS — M79609 Pain in unspecified limb: Secondary | ICD-10-CM

## 2012-01-14 DIAGNOSIS — C50519 Malignant neoplasm of lower-outer quadrant of unspecified female breast: Secondary | ICD-10-CM

## 2012-01-14 DIAGNOSIS — E119 Type 2 diabetes mellitus without complications: Secondary | ICD-10-CM

## 2012-01-14 DIAGNOSIS — C50919 Malignant neoplasm of unspecified site of unspecified female breast: Secondary | ICD-10-CM

## 2012-01-14 DIAGNOSIS — C7951 Secondary malignant neoplasm of bone: Secondary | ICD-10-CM

## 2012-01-14 NOTE — Patient Instructions (Signed)
DHARMA PARE  [id number] Apr 24, 1951   [facility]  Discharge Instructions  RECOMMENDATIONS MADE BY THE CONSULTANT AND ANY TEST RESULTS WILL BE SENT TO YOUR REFERRING DOCTOR.   EXAM FINDINGS BY MD TODAY AND SIGNS AND SYMPTOMS TO REPORT TO CLINIC OR PRIMARY MD: Need to do MRI of lumbar spine and PET scan to see if there is anything going on.  MEDICATIONS PRESCRIBED: Clonazepam 0.5 mg  - can take 1 or 2 at bedtime as needed for anxiety or sleep. Follow label directions  INSTRUCTIONS GIVEN AND DISCUSSED: Other :  Report uncontrolled pain  SPECIAL INSTRUCTIONS/FOLLOW-UP: Lab work Needed as scheduled, Xray Studies Needed MRI of lumbar spine on 2/15 & PET Scan on 2/20 and Return to Clinic on 4/12 to see Dr. Mariel Sleet   I acknowledge that I have been informed and understand all the instructions given to me and received a copy. I do not have any more questions at this time, but understand that I may call the Specialty Clinic at [facility] at [phone] during business hours should I have any further questions or need assistance in obtaining follow-up care.    __________________________________________  _____________  __________ Signature of Patient or Authorized Representative            Date                   Time    __________________________________________ Nurse's Signature

## 2012-01-14 NOTE — Progress Notes (Signed)
CC:   Kristen Parker, M.D. [name], M.D., Ph.D. [name], M.D. [name], M.D.  DIAGNOSES: 1. Metastatic lobular carcinoma of the right breast status post     mastectomy for a 4.8 cm primary, 26 of 29 nodes were positive and     she had widespread bony metastases.  She had radiation to the T6     area by Dr. Mitzi Hansen due to symptomatic pain.  She also underwent     radiation of the right chest wall and axillary area after her     breast was removed to control local regional disease with surgery     on 08/11/2008.  She presented here to me for the first time on     08/10/2008.  She initially was treated with Femara, and then due to     progression we had to switch her to tamoxifen. 2. Diabetes mellitus. 3. Chronic constipation. 4. New onset of intermittent urinary incontinence. 5. Possible peripheral neuropathy of the lower legs, worse on the left     she states, especially the left eye area. 6. Pain in the right big toe metatarsophalangeal joint consistent with     degenerative joint disease. 7. Lymphedema of the right arm for which she is getting physical     therapy, using a pump twice a day. 8. Paravertebral muscle spasms in the past, a little bit worse on the     left lower back now than before. Britt's back pain is getting worse both lower and upper, and she has developed this incontinence of urine at times which is worse than she has ever had it before.  She used to have some with coughing or sneezing, but this is not at all like the past and she is having to empty her bladder much more frequently she states.  She is also having this left thigh discomfort and that is new in the last few weeks.  She is not aware of anything else.  She does not have as much energy.  She thinks that is perhaps the other thing I can think of that we talked about.  She does not lose any control of her bowels I should also add.  PHYSICAL EXAMINATION:  She is stable at 212  pounds, blood pressure is 152/79 left arm sitting position, pulse 62-72 and regular, respirations 18-20 and unlabored.  She is afebrile, but her pain score is 5.  Lymph nodes remain negative in the cervical, supraclavicular, infraclavicular, axillary or inguinal areas.  She has no obvious hepatosplenomegaly.  She has no obvious ascites.  Her chest wall on the right is negative.  Left breast is negative.  Her Port-A-Cath is intact.  Her heart does not reveal an S3 gallop.  Her lungs are clear.  She has not have any ankle edema.  So I think I really need to do an MRI of her lumbar spine and a PET scan since her cancer marker, which at one time in the past went down certainly to the normal range, now is in the 70-80 range and clearly fluctuates, but with these symptoms I am concerned that she has progression of disease.  So she will call me the day after she has these scans to go over each one of them, and I will see her back one way or the other in 8 weeks or less.   ______________________________ Ladona Horns. Mariel Sleet, MD ESN/MEDQ  D:  01/14/2012  T:  01/14/2012  Job:  [id number] 

## 2012-01-14 NOTE — Progress Notes (Signed)
This office note has been dictated.

## 2012-01-16 ENCOUNTER — Other Ambulatory Visit (HOSPITAL_COMMUNITY): Payer: Self-pay | Admitting: Oncology

## 2012-01-17 ENCOUNTER — Ambulatory Visit (HOSPITAL_COMMUNITY)
Admission: RE | Admit: 2012-01-17 | Discharge: 2012-01-17 | Disposition: A | Discharge status: Home / Self Care | Payer: Medicare Other | Source: Ambulatory Visit | Attending: Oncology | Admitting: Oncology

## 2012-01-17 DIAGNOSIS — C50919 Malignant neoplasm of unspecified site of unspecified female breast: Secondary | ICD-10-CM | POA: Insufficient documentation

## 2012-01-17 DIAGNOSIS — M545 Low back pain, unspecified: Secondary | ICD-10-CM | POA: Insufficient documentation

## 2012-01-17 DIAGNOSIS — M5126 Other intervertebral disc displacement, lumbar region: Secondary | ICD-10-CM | POA: Insufficient documentation

## 2012-01-17 DIAGNOSIS — C7951 Secondary malignant neoplasm of bone: Secondary | ICD-10-CM | POA: Insufficient documentation

## 2012-01-20 ENCOUNTER — Other Ambulatory Visit (HOSPITAL_COMMUNITY): Payer: Self-pay | Admitting: Oncology

## 2012-01-20 ENCOUNTER — Telehealth (HOSPITAL_COMMUNITY): Payer: Self-pay

## 2012-01-20 DIAGNOSIS — C7951 Secondary malignant neoplasm of bone: Secondary | ICD-10-CM

## 2012-01-20 NOTE — Telephone Encounter (Signed)
Patient aware.  Needs written prescription for compression sleeve for right arm.  Will pick up tomorrow afternoon.

## 2012-01-20 NOTE — Telephone Encounter (Signed)
Message copied by Sterling Big on Mon Jan 20, 2012  2:05 PM ------      Message from: Mariel Sleet, ERIC S      Created: Mon Jan 20, 2012 10:58 AM       I orderd new mri of t-spine/

## 2012-01-21 ENCOUNTER — Ambulatory Visit (HOSPITAL_COMMUNITY)
Admission: RE | Admit: 2012-01-21 | Discharge: 2012-01-21 | Disposition: A | Discharge status: Home / Self Care | Payer: Medicare Other | Source: Ambulatory Visit | Attending: Oncology | Admitting: Oncology

## 2012-01-21 DIAGNOSIS — Z923 Personal history of irradiation: Secondary | ICD-10-CM | POA: Insufficient documentation

## 2012-01-21 DIAGNOSIS — C50919 Malignant neoplasm of unspecified site of unspecified female breast: Secondary | ICD-10-CM | POA: Insufficient documentation

## 2012-01-21 DIAGNOSIS — C7951 Secondary malignant neoplasm of bone: Secondary | ICD-10-CM | POA: Insufficient documentation

## 2012-01-21 DIAGNOSIS — M546 Pain in thoracic spine: Secondary | ICD-10-CM | POA: Insufficient documentation

## 2012-01-22 ENCOUNTER — Encounter (HOSPITAL_COMMUNITY)
Admission: RE | Admit: 2012-01-22 | Discharge: 2012-01-22 | Disposition: A | Discharge status: Home / Self Care | Payer: Medicare Other | Source: Ambulatory Visit | Attending: Oncology | Admitting: Oncology

## 2012-01-22 DIAGNOSIS — Z923 Personal history of irradiation: Secondary | ICD-10-CM | POA: Insufficient documentation

## 2012-01-22 DIAGNOSIS — C50919 Malignant neoplasm of unspecified site of unspecified female breast: Secondary | ICD-10-CM | POA: Insufficient documentation

## 2012-01-22 DIAGNOSIS — K7689 Other specified diseases of liver: Secondary | ICD-10-CM | POA: Insufficient documentation

## 2012-01-22 DIAGNOSIS — C7951 Secondary malignant neoplasm of bone: Secondary | ICD-10-CM | POA: Insufficient documentation

## 2012-01-22 DIAGNOSIS — N2889 Other specified disorders of kidney and ureter: Secondary | ICD-10-CM | POA: Insufficient documentation

## 2012-01-22 DIAGNOSIS — I517 Cardiomegaly: Secondary | ICD-10-CM | POA: Insufficient documentation

## 2012-01-22 DIAGNOSIS — Z9010 Acquired absence of unspecified breast and nipple: Secondary | ICD-10-CM | POA: Insufficient documentation

## 2012-01-22 LAB — GLUCOSE, CAPILLARY: Glucose-Capillary: 82 mg/dL (ref 70–99)

## 2012-01-22 MED ORDER — FLUDEOXYGLUCOSE F - 18 (FDG) INJECTION
14.4000 | Freq: Once | INTRAVENOUS | Status: AC | PRN
  Administered 2012-01-22: 14.4 via INTRAVENOUS

## 2012-01-24 ENCOUNTER — Encounter (HOSPITAL_COMMUNITY): Payer: Self-pay | Admitting: Oncology

## 2012-01-24 ENCOUNTER — Encounter (HOSPITAL_BASED_OUTPATIENT_CLINIC_OR_DEPARTMENT_OTHER): Payer: Medicare Other | Admitting: Oncology

## 2012-01-24 VITALS — BP 132/76 | HR 84 | Temp 98.7°F | Wt 214.8 lb

## 2012-01-24 DIAGNOSIS — C50919 Malignant neoplasm of unspecified site of unspecified female breast: Secondary | ICD-10-CM

## 2012-01-24 DIAGNOSIS — C50519 Malignant neoplasm of lower-outer quadrant of unspecified female breast: Secondary | ICD-10-CM

## 2012-01-24 DIAGNOSIS — C7952 Secondary malignant neoplasm of bone marrow: Secondary | ICD-10-CM

## 2012-01-24 DIAGNOSIS — Z5111 Encounter for antineoplastic chemotherapy: Secondary | ICD-10-CM

## 2012-01-24 DIAGNOSIS — R32 Unspecified urinary incontinence: Secondary | ICD-10-CM

## 2012-01-24 LAB — CANCER ANTIGEN 27.29: CA 27.29: 81 U/mL — ABNORMAL HIGH (ref 0–39)

## 2012-01-24 MED ORDER — HEPARIN SOD (PORK) LOCK FLUSH 100 UNIT/ML IV SOLN
500.0000 [IU] | Freq: Once | INTRAVENOUS | Status: DC
  Filled 2012-01-24: qty 5

## 2012-01-24 MED ORDER — SODIUM CHLORIDE 0.9 % IJ SOLN
10.0000 mL | Freq: Once | INTRAMUSCULAR | Status: DC
  Filled 2012-01-24: qty 10

## 2012-01-24 MED ORDER — SODIUM CHLORIDE 0.9 % IJ SOLN
INTRAMUSCULAR | Status: AC
  Filled 2012-01-24: qty 10

## 2012-01-24 MED ORDER — HEPARIN SOD (PORK) LOCK FLUSH 100 UNIT/ML IV SOLN
INTRAVENOUS | Status: AC
  Filled 2012-01-24: qty 5

## 2012-01-24 MED ORDER — FULVESTRANT 250 MG/5ML IM SOLN
500.0000 mg | INTRAMUSCULAR | Status: DC
  Administered 2012-01-24: 500 mg via INTRAMUSCULAR
  Filled 2012-01-24: qty 10

## 2012-01-24 NOTE — Progress Notes (Signed)
Erling Conte presents today for injection per MD orders. faslodex 500mg  administered im in left and right gluteal given IM z-track with pt lying supine. Administration without incident. Patient tolerated well.

## 2012-01-24 NOTE — Progress Notes (Signed)
Kristen Parker presented for labwork. Labs per MD order drawn via Peripheral Line 25 gauge needle inserted in lt ac.   Procedure without incident.  Needle removed intact. Patient tolerated procedure well.

## 2012-01-24 NOTE — Progress Notes (Signed)
This office note has been dictated.

## 2012-01-24 NOTE — Progress Notes (Signed)
CC:   Payton Doughty, M.D. [name], M.D., Ph.D. [name], M.D. [name], M.D. Bertram Millard. Dahlstedt, M.D.  DIAGNOSES: 1. Progression of her metastatic lobular carcinoma with multiple areas     of bone progression.  She, of course, presented here with a right     breast lobular carcinoma with metastatic disease at presentation     with a 4.8 cm primary and 26 of 29 positive nodes.  She did have     radiation to the T6 area by Dr. Mitzi Hansen due to symptomatic pain.  She     also had radiation of the right chest wall and axillary area for     control of local regional disease.  Her surgery was on 08/11/2008.     I saw for the 1st time on 08/10/2008.  We treated her with Femara     with a very, very nice response and then when she progressed, we     switched her to tamoxifen.  We are going to stop that drug and     start Faslodex today. 2. Diabetes mellitus. 3. Chronic constipation. 4. Intermittent urinary incontinence.  We will send her to Dr.     Retta Diones for a consultation. 5. 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 6. Left thigh discomfort. 7. Pain in the right big toe/metatarsophalangeal joint consistent with     degenerative joint disease, that is on the right. 8. Lymphedema of the right arm since surgery and radiation, using     physical therapies or recommendations including pump and a sleeve     and exercises. 9. Paravertebral muscle spasms in the past, slightly worse recently.  Lolitha's cancer marker used to be down the normal range with the Femara, but it rose gradually, seemed stabilized, but her PET scan the other day clearly shows bony progression.  With her urinary incontinence we did thoracic and lumbar MRIs and they really were not helpful, but the PET scan shows progression in the bones without any nerve root compression, etc., so to that end we will switch her to Faslodex, stop the tamoxifen, get a consultation with Dr. Retta Diones about her urinary  symptoms.  We talked a long time today after going over the films about what has happened, of course, the fact that she has progressed, and we are going to try this last hormonal manipulation and then consider chemotherapy if we need to.  We spent about 40 minutes together going over everything, most of which was in counseling etc.  We will repeat a PET scan after 4 months of therapy.  We will start with 500 mg day 1, day 15 and day 29 and then go to every 30 days.  We will see if this works, of course.  I will see her in about 6 weeks.    ______________________________ Ladona Horns. Mariel Sleet, MD ESN/MEDQ  D:  01/24/2012  T:  01/24/2012  Job:  [id number]

## 2012-01-24 NOTE — Patient Instructions (Signed)
[facility]  Discharge Instructions  RECOMMENDATIONS MADE BY THE CONSULTANT AND ANY TEST RESULTS WILL BE SENT TO YOUR REFERRING DOCTOR.   EXAM FINDINGS BY MD TODAY AND SIGNS AND SYMPTOMS TO REPORT TO CLINIC OR PRIMARY MD:  We need to stop the oral med and switch you to faslodex. This is also anti-hormonal and is an injection that we will give you today, in 2 weeks, and then every 28 days.  Stop tamoxifen INSTRUCTIONS GIVEN AND DISCUSSED: Pet scan in June SPECIAL INSTRUCTIONS/FOLLOW-UP: See Dr. Mariel Sleet in 6 weeks and 4 months.  I acknowledge that I have been informed and understand all the instructions given to me and received a copy. I do not have any more questions at this time, but understand that I may call the Specialty Clinic at [facility] at [phone] during business hours should I have any further questions or need assistance in obtaining follow-up care.    __________________________________________  _____________  __________ Signature of Patient or Authorized Representative            Date                   Time    __________________________________________ Nurse's Signature

## 2012-01-31 ENCOUNTER — Encounter (HOSPITAL_BASED_OUTPATIENT_CLINIC_OR_DEPARTMENT_OTHER): Payer: Medicare Other

## 2012-01-31 ENCOUNTER — Ambulatory Visit (HOSPITAL_COMMUNITY): Payer: Medicare Other

## 2012-01-31 ENCOUNTER — Encounter (HOSPITAL_COMMUNITY): Payer: Medicare Other | Attending: Oncology

## 2012-01-31 DIAGNOSIS — C7951 Secondary malignant neoplasm of bone: Secondary | ICD-10-CM | POA: Insufficient documentation

## 2012-01-31 DIAGNOSIS — C50519 Malignant neoplasm of lower-outer quadrant of unspecified female breast: Secondary | ICD-10-CM

## 2012-01-31 DIAGNOSIS — C50919 Malignant neoplasm of unspecified site of unspecified female breast: Secondary | ICD-10-CM

## 2012-01-31 DIAGNOSIS — C801 Malignant (primary) neoplasm, unspecified: Secondary | ICD-10-CM | POA: Insufficient documentation

## 2012-01-31 LAB — COMPREHENSIVE METABOLIC PANEL
AST: 19 U/L (ref 0–37)
Albumin: 3.7 g/dL (ref 3.5–5.2)
Calcium: 10.1 mg/dL (ref 8.4–10.5)
Creatinine, Ser: 0.7 mg/dL (ref 0.50–1.10)

## 2012-01-31 MED ORDER — HEPARIN SOD (PORK) LOCK FLUSH 100 UNIT/ML IV SOLN
INTRAVENOUS | Status: AC
  Filled 2012-01-31: qty 5

## 2012-01-31 MED ORDER — HEPARIN SOD (PORK) LOCK FLUSH 100 UNIT/ML IV SOLN
500.0000 [IU] | Freq: Once | INTRAVENOUS | Status: DC | PRN
  Filled 2012-01-31: qty 5

## 2012-01-31 MED ORDER — SODIUM CHLORIDE 0.9 % IV SOLN
Freq: Once | INTRAVENOUS | Status: AC
  Administered 2012-01-31: 12:00:00 via INTRAVENOUS

## 2012-01-31 MED ORDER — HEPARIN SOD (PORK) LOCK FLUSH 100 UNIT/ML IV SOLN
250.0000 [IU] | Freq: Once | INTRAVENOUS | Status: DC | PRN
  Filled 2012-01-31: qty 5

## 2012-01-31 MED ORDER — ZOLEDRONIC ACID 4 MG/5ML IV CONC
4.0000 mg | Freq: Once | INTRAVENOUS | Status: AC
  Administered 2012-01-31: 4 mg via INTRAVENOUS
  Filled 2012-01-31: qty 5

## 2012-01-31 NOTE — Progress Notes (Signed)
Tolerated zometa well. 

## 2012-01-31 NOTE — Progress Notes (Signed)
Tolerated infusion well. 

## 2012-01-31 NOTE — Progress Notes (Signed)
Tolerated well

## 2012-02-04 ENCOUNTER — Encounter (HOSPITAL_COMMUNITY): Payer: Medicare Other

## 2012-02-07 ENCOUNTER — Encounter (HOSPITAL_COMMUNITY): Payer: Medicare Other

## 2012-02-07 ENCOUNTER — Encounter (HOSPITAL_BASED_OUTPATIENT_CLINIC_OR_DEPARTMENT_OTHER): Payer: Medicare Other

## 2012-02-07 ENCOUNTER — Other Ambulatory Visit (HOSPITAL_COMMUNITY): Payer: Self-pay | Admitting: Oncology

## 2012-02-07 DIAGNOSIS — C7951 Secondary malignant neoplasm of bone: Secondary | ICD-10-CM

## 2012-02-07 DIAGNOSIS — Z5111 Encounter for antineoplastic chemotherapy: Secondary | ICD-10-CM

## 2012-02-07 DIAGNOSIS — C50919 Malignant neoplasm of unspecified site of unspecified female breast: Secondary | ICD-10-CM

## 2012-02-07 DIAGNOSIS — C50519 Malignant neoplasm of lower-outer quadrant of unspecified female breast: Secondary | ICD-10-CM

## 2012-02-07 MED ORDER — FULVESTRANT 250 MG/5ML IM SOLN
500.0000 mg | Freq: Once | INTRAMUSCULAR | Status: AC
  Administered 2012-02-07: 500 mg via INTRAMUSCULAR
  Filled 2012-02-07: qty 10

## 2012-02-07 MED ORDER — FULVESTRANT 250 MG/5ML IM SOLN
500.0000 mg | Freq: Once | INTRAMUSCULAR | Status: DC
  Filled 2012-02-07: qty 10

## 2012-02-07 NOTE — Progress Notes (Signed)
Kristen Parker presents today for injection per MD orders. faslodex administered IM in right and left Gluteal z-track.. Administration without incident. Patient tolerated well.  Pt will have ca 27.29 and cmet done on 3/22 when comes for faslodex then monthly.

## 2012-02-11 ENCOUNTER — Ambulatory Visit (HOSPITAL_COMMUNITY): Payer: Medicare Other | Admitting: Oncology

## 2012-02-12 ENCOUNTER — Other Ambulatory Visit (HOSPITAL_COMMUNITY): Payer: Self-pay | Admitting: Oncology

## 2012-02-12 DIAGNOSIS — C7951 Secondary malignant neoplasm of bone: Secondary | ICD-10-CM

## 2012-02-12 DIAGNOSIS — C50919 Malignant neoplasm of unspecified site of unspecified female breast: Secondary | ICD-10-CM

## 2012-02-12 MED ORDER — HYDROCODONE-ACETAMINOPHEN 5-500 MG PO TABS: 1.0000 | ORAL_TABLET | ORAL | Status: DC | PRN

## 2012-02-20 ENCOUNTER — Other Ambulatory Visit (HOSPITAL_COMMUNITY): Payer: Self-pay | Admitting: Oncology

## 2012-02-20 DIAGNOSIS — R609 Edema, unspecified: Secondary | ICD-10-CM

## 2012-02-20 MED ORDER — TRIAMTERENE-HCTZ 37.5-25 MG PO CAPS: 1.0000 | ORAL_CAPSULE | ORAL | Status: DC

## 2012-02-21 ENCOUNTER — Encounter (HOSPITAL_BASED_OUTPATIENT_CLINIC_OR_DEPARTMENT_OTHER): Payer: Medicare Other

## 2012-02-21 VITALS — BP 144/75 | HR 103 | Temp 99.9°F

## 2012-02-21 DIAGNOSIS — Z5111 Encounter for antineoplastic chemotherapy: Secondary | ICD-10-CM

## 2012-02-21 DIAGNOSIS — C50519 Malignant neoplasm of lower-outer quadrant of unspecified female breast: Secondary | ICD-10-CM

## 2012-02-21 DIAGNOSIS — C7951 Secondary malignant neoplasm of bone: Secondary | ICD-10-CM

## 2012-02-21 DIAGNOSIS — C50919 Malignant neoplasm of unspecified site of unspecified female breast: Secondary | ICD-10-CM

## 2012-02-21 LAB — COMPREHENSIVE METABOLIC PANEL
ALT: 10 U/L (ref 0–35)
AST: 19 U/L (ref 0–37)
Albumin: 3.8 g/dL (ref 3.5–5.2)
CO2: 24 mEq/L (ref 19–32)
Chloride: 103 mEq/L (ref 96–112)
Creatinine, Ser: 0.8 mg/dL (ref 0.50–1.10)
GFR calc non Af Amer: 79 mL/min — ABNORMAL LOW (ref >90)
Potassium: 4.3 mEq/L (ref 3.5–5.1)
Sodium: 136 mEq/L (ref 135–145)
Total Bilirubin: 0.3 mg/dL (ref 0.3–1.2)

## 2012-02-21 MED ORDER — FULVESTRANT 250 MG/5ML IM SOLN
500.0000 mg | Freq: Once | INTRAMUSCULAR | Status: AC
  Administered 2012-02-21: 500 mg via INTRAMUSCULAR
  Filled 2012-02-21: qty 10

## 2012-02-21 NOTE — Progress Notes (Signed)
Kristen Parker presented for labwork. Labs per MD order drawn via Peripheral Line 23 gauge needle inserted in left antecubital.  Good blood return present. Procedure without incident.  Needle removed intact. Patient tolerated procedure well.  Kristen Parker presents today for injection per MD orders. Faslodex 500 mg given in 2 doses of 250 mg in right and left gluteals upper outer quadrants administered IM.  Administration without incident. Patient tolerated well.

## 2012-02-22 LAB — CANCER ANTIGEN 27.29: CA 27.29: 92 U/mL — ABNORMAL HIGH (ref 0–39)

## 2012-03-02 ENCOUNTER — Ambulatory Visit (HOSPITAL_COMMUNITY): Payer: Medicare Other

## 2012-03-06 ENCOUNTER — Encounter (HOSPITAL_COMMUNITY): Payer: Medicare Other

## 2012-03-06 ENCOUNTER — Encounter (HOSPITAL_COMMUNITY): Payer: Self-pay | Admitting: Oncology

## 2012-03-06 ENCOUNTER — Other Ambulatory Visit (HOSPITAL_COMMUNITY): Payer: Medicare Other

## 2012-03-06 ENCOUNTER — Encounter (HOSPITAL_BASED_OUTPATIENT_CLINIC_OR_DEPARTMENT_OTHER): Payer: Medicare Other | Admitting: Oncology

## 2012-03-06 ENCOUNTER — Encounter (HOSPITAL_COMMUNITY): Payer: Medicare Other | Attending: Oncology

## 2012-03-06 VITALS — BP 149/84 | HR 102 | Temp 98.9°F | Wt 218.6 lb

## 2012-03-06 DIAGNOSIS — C7952 Secondary malignant neoplasm of bone marrow: Secondary | ICD-10-CM

## 2012-03-06 DIAGNOSIS — C50519 Malignant neoplasm of lower-outer quadrant of unspecified female breast: Secondary | ICD-10-CM

## 2012-03-06 DIAGNOSIS — C50919 Malignant neoplasm of unspecified site of unspecified female breast: Secondary | ICD-10-CM

## 2012-03-06 DIAGNOSIS — C801 Malignant (primary) neoplasm, unspecified: Secondary | ICD-10-CM | POA: Insufficient documentation

## 2012-03-06 DIAGNOSIS — M7989 Other specified soft tissue disorders: Secondary | ICD-10-CM | POA: Insufficient documentation

## 2012-03-06 DIAGNOSIS — C7951 Secondary malignant neoplasm of bone: Secondary | ICD-10-CM

## 2012-03-06 MED ORDER — HEPARIN SOD (PORK) LOCK FLUSH 100 UNIT/ML IV SOLN
500.0000 [IU] | Freq: Once | INTRAVENOUS | Status: AC
  Administered 2012-03-06: 500 [IU] via INTRAVENOUS
  Filled 2012-03-06: qty 5

## 2012-03-06 MED ORDER — ZOLEDRONIC ACID 4 MG/5ML IV CONC
4.0000 mg | Freq: Once | INTRAVENOUS | Status: AC
  Administered 2012-03-06: 4 mg via INTRAVENOUS
  Filled 2012-03-06: qty 5

## 2012-03-06 MED ORDER — SODIUM CHLORIDE 0.9 % IV SOLN
Freq: Once | INTRAVENOUS | Status: AC
  Administered 2012-03-06: 13:00:00 via INTRAVENOUS

## 2012-03-06 MED ORDER — SODIUM CHLORIDE 0.9 % IJ SOLN
INTRAMUSCULAR | Status: AC
  Filled 2012-03-06: qty 10

## 2012-03-06 MED ORDER — HEPARIN SOD (PORK) LOCK FLUSH 100 UNIT/ML IV SOLN
INTRAVENOUS | Status: AC
  Filled 2012-03-06: qty 5

## 2012-03-06 MED ORDER — SODIUM CHLORIDE 0.9 % IJ SOLN
10.0000 mL | INTRAMUSCULAR | Status: AC | PRN
  Administered 2012-03-06: 10 mL via INTRAVENOUS
  Filled 2012-03-06: qty 10

## 2012-03-06 NOTE — Progress Notes (Signed)
Kristen Parker tolerated infusion well and without incident; verbalizes understanding for follow-up.  No distress noted at time of discharge and patient was discharged home by herself.

## 2012-03-06 NOTE — Patient Instructions (Signed)
Kristen Parker  [id number] 1951/03/17   [facility]  Discharge Instructions  RECOMMENDATIONS MADE BY THE CONSULTANT AND ANY TEST RESULTS WILL BE SENT TO YOUR REFERRING DOCTOR.   EXAM FINDINGS BY MD TODAY AND SIGNS AND SYMPTOMS TO REPORT TO CLINIC OR PRIMARY MD: you are doing well, we will not make any changes.  MEDICATIONS PRESCRIBED: none   INSTRUCTIONS GIVEN AND DISCUSSED: Other :  Report uncontrolled pain, shortness of breath, increased bone pain, etc.  SPECIAL INSTRUCTIONS/FOLLOW-UP: Return to Clinic as scheduled.   I acknowledge that I have been informed and understand all the instructions given to me and received a copy. I do not have any more questions at this time, but understand that I may call the Specialty Clinic at [facility] at [phone] during business hours should I have any further questions or need assistance in obtaining follow-up care.    __________________________________________  _____________  __________ Signature of Patient or Authorized Representative            Date                   Time    __________________________________________ Nurse's Signature

## 2012-03-06 NOTE — Progress Notes (Signed)
This office note has been dictated.

## 2012-03-06 NOTE — Progress Notes (Signed)
CC:   [name], M.D. Payton Doughty, M.D. [name], M.D., Ph.D. [name], M.D. Lindaann Slough, M.D.  DIAGNOSIS: 1. Metastatic lobular carcinoma of the right breast, status post     mastectomy for a 4.8 cm primary with 26 of 29 positive nodes, and     then was found to have widespread bony metastases.  Her present     therapy consists of Faslodex, having failed Femara, initially with     a very nice response, then tamoxifen with a mild response now, now     she is on Faslodex.  Dr. Mitzi Hansen did treat T6 vertebral body area due     to symptomatic pain.  She is also status post radiation to the     right chest and axillary area for control of local regional     disease, with her date of surgery on 08/11/2008.  I saw her for the     first time on 08/10/2008. 2. Diabetes mellitus. 3. Obesity. 4. Chronic constipation. 5. Overactive bladder, for which she is seeing Dr. Su Grand. 6. 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 7. 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 8. Lymphedema of the right arm since surgery and radiation therapy,     using a pump and sleeve with exercises. 9. Paravertebral muscle spasms in the past.  HISTORY:  Kristen Parker is doing well with the overactive bladder, but that is still a little bit of a problem, but it is not worse, maybe slightly better it sounds like with the medication.  She was placed most recently on Toviaz 8 mg per day, it sounds like.  She has stable vital signs.  In fact, her weight is, unfortunately, up a few pounds since she over did it at [place], she states.  Her blood work, though, also is a little bit confusing in that her CA27.29 was up from 81 to 92, but again, we have change therapies, and this may be up because of tumor cell death hopefully.  Her CMET is more than adequate for Zometa, for which she is due today.  Her liver enzymes are normal, alkaline phosphatase included.  Calcium,  creatinine, BUN, etc., are normal.  Glucose was also quite good at 88 on the 22nd of March.  So, the plan is to give her 4 full months of Faslodex, PET scan in late June, and I will see her back, but her exam is really quite benign.  She actually looks very good today with stable vital signs.  Lymph nodes remain negative throughout.  The right chest wall is clear.  Her port is intact.  Lungs are clear to auscultation and percussion.  Heart shows a regular rhythm and rate without murmur, rub or gallop.  Abdomen is soft, obese, nontender without organomegaly.  No leg edema, just the right arm edema.  So I think she looks very good overall, and I am hoping that this will help her for some period of time.  I am hopeful we do not have to do chemotherapy, but we will wait and see.   ______________________________ Ladona Horns. Mariel Sleet, MD ESN/MEDQ  D:  03/06/2012  T:  03/06/2012  Job:  [id number]

## 2012-03-13 ENCOUNTER — Ambulatory Visit (HOSPITAL_COMMUNITY): Payer: Medicare Other | Admitting: Oncology

## 2012-03-20 ENCOUNTER — Other Ambulatory Visit (HOSPITAL_COMMUNITY): Payer: Self-pay | Admitting: Oncology

## 2012-03-20 ENCOUNTER — Encounter (HOSPITAL_BASED_OUTPATIENT_CLINIC_OR_DEPARTMENT_OTHER): Payer: Medicare Other

## 2012-03-20 VITALS — BP 125/78 | HR 105 | Temp 99.0°F

## 2012-03-20 DIAGNOSIS — C50519 Malignant neoplasm of lower-outer quadrant of unspecified female breast: Secondary | ICD-10-CM

## 2012-03-20 DIAGNOSIS — C50919 Malignant neoplasm of unspecified site of unspecified female breast: Secondary | ICD-10-CM

## 2012-03-20 DIAGNOSIS — C7952 Secondary malignant neoplasm of bone marrow: Secondary | ICD-10-CM

## 2012-03-20 DIAGNOSIS — Z5111 Encounter for antineoplastic chemotherapy: Secondary | ICD-10-CM

## 2012-03-20 DIAGNOSIS — M7989 Other specified soft tissue disorders: Secondary | ICD-10-CM

## 2012-03-20 DIAGNOSIS — C7951 Secondary malignant neoplasm of bone: Secondary | ICD-10-CM

## 2012-03-20 LAB — COMPREHENSIVE METABOLIC PANEL
ALT: 12 U/L (ref 0–35)
Calcium: 10.2 mg/dL (ref 8.4–10.5)
Creatinine, Ser: 0.69 mg/dL (ref 0.50–1.10)
GFR calc Af Amer: >90 mL/min (ref >90)
GFR calc non Af Amer: >90 mL/min (ref >90)
Glucose, Bld: 112 mg/dL — ABNORMAL HIGH (ref 70–99)
Sodium: 135 mEq/L (ref 135–145)
Total Protein: 6.9 g/dL (ref 6.0–8.3)

## 2012-03-20 MED ORDER — ALTEPLASE 2 MG IJ SOLR
2.0000 mg | Freq: Once | INTRAMUSCULAR | Status: DC | PRN
  Filled 2012-03-20: qty 2

## 2012-03-20 MED ORDER — HEPARIN SOD (PORK) LOCK FLUSH 100 UNIT/ML IV SOLN
500.0000 [IU] | Freq: Once | INTRAVENOUS | Status: DC | PRN
  Filled 2012-03-20: qty 5

## 2012-03-20 MED ORDER — SODIUM CHLORIDE 0.9 % IJ SOLN
10.0000 mL | INTRAMUSCULAR | Status: DC | PRN
  Filled 2012-03-20: qty 10

## 2012-03-20 MED ORDER — HEPARIN SOD (PORK) LOCK FLUSH 100 UNIT/ML IV SOLN
250.0000 [IU] | Freq: Once | INTRAVENOUS | Status: DC | PRN
  Filled 2012-03-20: qty 5

## 2012-03-20 MED ORDER — SODIUM CHLORIDE 0.9 % IJ SOLN
3.0000 mL | Freq: Once | INTRAMUSCULAR | Status: DC | PRN
  Filled 2012-03-20: qty 10

## 2012-03-20 MED ORDER — FULVESTRANT 250 MG/5ML IM SOLN
500.0000 mg | INTRAMUSCULAR | Status: DC
  Administered 2012-03-20: 500 mg via INTRAMUSCULAR
  Filled 2012-03-20: qty 10

## 2012-03-20 NOTE — Progress Notes (Signed)
Faslodex 500 mg sub-Q in divided doses to rt and lt ventogluteal muscles.  Tolerated well.

## 2012-03-24 ENCOUNTER — Other Ambulatory Visit (HOSPITAL_COMMUNITY): Payer: Self-pay

## 2012-03-24 DIAGNOSIS — C7951 Secondary malignant neoplasm of bone: Secondary | ICD-10-CM

## 2012-03-24 DIAGNOSIS — C50919 Malignant neoplasm of unspecified site of unspecified female breast: Secondary | ICD-10-CM

## 2012-03-25 ENCOUNTER — Other Ambulatory Visit (HOSPITAL_COMMUNITY): Payer: Self-pay | Admitting: Oncology

## 2012-03-25 DIAGNOSIS — C50919 Malignant neoplasm of unspecified site of unspecified female breast: Secondary | ICD-10-CM

## 2012-03-25 DIAGNOSIS — C7951 Secondary malignant neoplasm of bone: Secondary | ICD-10-CM

## 2012-03-25 MED ORDER — HYDROCODONE-ACETAMINOPHEN 5-500 MG PO TABS: 1.0000 | ORAL_TABLET | ORAL | Status: DC | PRN

## 2012-03-26 ENCOUNTER — Other Ambulatory Visit: Payer: Self-pay | Admitting: Obstetrics & Gynecology

## 2012-03-26 ENCOUNTER — Other Ambulatory Visit (HOSPITAL_COMMUNITY)
Admission: RE | Admit: 2012-03-26 | Discharge: 2012-03-26 | Disposition: A | Discharge status: Home / Self Care | Payer: Medicare Other | Source: Ambulatory Visit | Attending: Obstetrics & Gynecology | Admitting: Obstetrics & Gynecology

## 2012-03-26 DIAGNOSIS — Z01419 Encounter for gynecological examination (general) (routine) without abnormal findings: Secondary | ICD-10-CM | POA: Insufficient documentation

## 2012-03-31 ENCOUNTER — Other Ambulatory Visit (HOSPITAL_COMMUNITY): Payer: Self-pay | Admitting: Oncology

## 2012-03-31 DIAGNOSIS — C50919 Malignant neoplasm of unspecified site of unspecified female breast: Secondary | ICD-10-CM

## 2012-03-31 MED ORDER — VENLAFAXINE HCL 75 MG PO TABS: 75.0000 mg | ORAL_TABLET | Freq: Every day | ORAL | Status: DC

## 2012-04-01 ENCOUNTER — Ambulatory Visit (HOSPITAL_COMMUNITY): Payer: Medicare Other

## 2012-04-01 ENCOUNTER — Other Ambulatory Visit (HOSPITAL_COMMUNITY): Payer: Medicare Other

## 2012-04-06 ENCOUNTER — Encounter (HOSPITAL_COMMUNITY): Payer: Medicare Other | Attending: Oncology

## 2012-04-06 ENCOUNTER — Encounter (HOSPITAL_COMMUNITY): Payer: Medicare Other

## 2012-04-06 VITALS — BP 118/66 | HR 16 | Temp 99.4°F

## 2012-04-06 DIAGNOSIS — C7951 Secondary malignant neoplasm of bone: Secondary | ICD-10-CM

## 2012-04-06 DIAGNOSIS — C50919 Malignant neoplasm of unspecified site of unspecified female breast: Secondary | ICD-10-CM

## 2012-04-06 DIAGNOSIS — C801 Malignant (primary) neoplasm, unspecified: Secondary | ICD-10-CM | POA: Insufficient documentation

## 2012-04-06 DIAGNOSIS — C50519 Malignant neoplasm of lower-outer quadrant of unspecified female breast: Secondary | ICD-10-CM

## 2012-04-06 DIAGNOSIS — C7952 Secondary malignant neoplasm of bone marrow: Secondary | ICD-10-CM | POA: Insufficient documentation

## 2012-04-06 LAB — COMPREHENSIVE METABOLIC PANEL
ALT: 12 U/L (ref 0–35)
AST: 22 U/L (ref 0–37)
Albumin: 3.1 g/dL — ABNORMAL LOW (ref 3.5–5.2)
Alkaline Phosphatase: 99 U/L (ref 39–117)
BUN: 13 mg/dL (ref 6–23)
Chloride: 109 mEq/L (ref 96–112)
Potassium: 3.8 mEq/L (ref 3.5–5.1)
Sodium: 139 mEq/L (ref 135–145)
Total Bilirubin: 0.2 mg/dL — ABNORMAL LOW (ref 0.3–1.2)
Total Protein: 6.1 g/dL (ref 6.0–8.3)

## 2012-04-06 MED ORDER — SODIUM CHLORIDE 0.9 % IJ SOLN
10.0000 mL | INTRAMUSCULAR | Status: DC | PRN
  Administered 2012-04-06: 10 mL via INTRAVENOUS
  Filled 2012-04-06: qty 10

## 2012-04-06 MED ORDER — HEPARIN SOD (PORK) LOCK FLUSH 100 UNIT/ML IV SOLN
INTRAVENOUS | Status: AC
  Filled 2012-04-06: qty 5

## 2012-04-06 MED ORDER — SODIUM CHLORIDE 0.9 % IJ SOLN
INTRAMUSCULAR | Status: AC
  Filled 2012-04-06: qty 10

## 2012-04-06 MED ORDER — SODIUM CHLORIDE 0.9 % IV SOLN
Freq: Once | INTRAVENOUS | Status: AC
  Administered 2012-04-06: 14:00:00 via INTRAVENOUS

## 2012-04-06 MED ORDER — ZOLEDRONIC ACID 4 MG/5ML IV CONC
4.0000 mg | Freq: Once | INTRAVENOUS | Status: AC
  Administered 2012-04-06: 4 mg via INTRAVENOUS
  Filled 2012-04-06: qty 5

## 2012-04-06 NOTE — Progress Notes (Signed)
Tolerated zometa well. 

## 2012-04-10 ENCOUNTER — Encounter (HOSPITAL_COMMUNITY): Payer: Self-pay | Admitting: Oncology

## 2012-04-10 ENCOUNTER — Encounter (HOSPITAL_BASED_OUTPATIENT_CLINIC_OR_DEPARTMENT_OTHER): Payer: Medicare Other | Admitting: Oncology

## 2012-04-10 VITALS — BP 138/82 | HR 111 | Temp 99.0°F | Wt 220.9 lb

## 2012-04-10 DIAGNOSIS — C7952 Secondary malignant neoplasm of bone marrow: Secondary | ICD-10-CM

## 2012-04-10 DIAGNOSIS — C50519 Malignant neoplasm of lower-outer quadrant of unspecified female breast: Secondary | ICD-10-CM

## 2012-04-10 DIAGNOSIS — C7951 Secondary malignant neoplasm of bone: Secondary | ICD-10-CM

## 2012-04-10 DIAGNOSIS — M549 Dorsalgia, unspecified: Secondary | ICD-10-CM

## 2012-04-10 DIAGNOSIS — G609 Hereditary and idiopathic neuropathy, unspecified: Secondary | ICD-10-CM

## 2012-04-10 NOTE — Patient Instructions (Signed)
Kristen Parker  [id number] Dec 05, 1950 Dr. Glenford Peers   [facility]  Discharge Instructions  RECOMMENDATIONS MADE BY THE CONSULTANT AND ANY TEST RESULTS WILL BE SENT TO YOUR REFERRING DOCTOR.   EXAM FINDINGS BY MD TODAY AND SIGNS AND SYMPTOMS TO REPORT TO CLINIC OR PRIMARY MD: You look good today.  You can take your pain medication every 4 hours as needed.  We will not make any changes at the present.  We will continue the faslodex.  Your tumor marker came down a little.  MEDICATIONS PRESCRIBED: none   INSTRUCTIONS GIVEN AND DISCUSSED: Other :  Report increased pain, new lumps, bone pain or shortness of breath.  SPECIAL INSTRUCTIONS/FOLLOW-UP: Lab work Needed as scheduled and Return to Clinic as scheduled.   I acknowledge that I have been informed and understand all the instructions given to me and received a copy. I do not have any more questions at this time, but understand that I may call the Specialty Clinic at [facility] at [phone] during business hours should I have any further questions or need assistance in obtaining follow-up care.    __________________________________________  _____________  __________ Signature of Patient or Authorized Representative            Date                   Time    __________________________________________ Nurse's Signature

## 2012-04-10 NOTE — Progress Notes (Signed)
This office note has been dictated.

## 2012-04-10 NOTE — Progress Notes (Signed)
CC:   [name], M.D. [name], M.D. Payton Doughty, M.D. [name], M.D., Ph.D. Lindaann Slough, M.D.  DIAGNOSES: 1. Metastatic lobular carcinoma of the right breast status post     mastectomy for a 4.8 cm primary with 26-29 positive nodes with     widespread bony metastases.  She was started on Femara initially     with a great response, then tamoxifen with a mild response, then     progression, now on Faslodex and she has had 4 doses and her cancer     marker may be starting to come down.  Dr. Mitzi Hansen has treated her     with a radiation therapy regimen to T6 and then she also had right     chest and axillary area radiation to help control local regional     disease.  Her date of surgery originally was 08/11/2008.  I saw her     for the first time on 08/10/2008. 2. Obesity. 3. Diabetes mellitus. 4. Chronic constipation. 5. Overactive bladder, seeing Dr. Brunilda Payor. 6. Mild peripheral neuropathy grade 1. 7. Degenerative joint disk disease of the low back causing hip     discomfort bilaterally which is not new or different. 8. Lymphedema of the right arm since her surgery and radiation therapy     using a pump and sleeve. 9. Paravertebral muscle spasms in the past.  Mitsuye has several reasons for back pain but I do not think she is worse based upon what she stated today.  What is encouraging is that her cancer marker has dropped from 104 to 90 since we have given her the fourth dose of the Faslodex so I am hoping this is evidence for a delayed but good response.  Her labs have shown her sugar is still too high, 238 the other day.  I think it was nonfasting of course but she needs to keep working on that.  Other than that on exam she has a little aching over the ribs especially upper ribs around 2, 3 and 4 bilaterally.  I cannot feel any nodularity or irregularity to the ribs and she has no point tenderness.  The right chest wall is clear.  Her Port-A-Cath is intact  but did not give blood the last time.  She has no nodes anywhere.  She has no hepatosplenomegaly.  Lungs are clear.  She has no edema except for the right arm.  So I think she is doing quite well.  Her heart shows also regular rhythm and rate without murmur, rub or gallop.  We are going to continue supporting her bones.  Will check her CA27.29 every time she gets Faslodex.  She is due for that in a couple of weeks.  We will see her back after a PET scan in June.  Her son is getting married on June 15, and I will not have to give her chemotherapy I suspect any time soon.  That way she will be able to keep her hair for the wedding which is very important for her.  Reassured her that chemotherapy is not in her immediate future for right now.  We may have to consider it if she progresses on Casodex but right now we are not there.   ______________________________ Ladona Horns. Mariel Sleet, MD ESN/MEDQ  D:  04/10/2012  T:  04/10/2012  Job:  [id number]

## 2012-04-17 ENCOUNTER — Encounter (HOSPITAL_BASED_OUTPATIENT_CLINIC_OR_DEPARTMENT_OTHER): Payer: Medicare Other

## 2012-04-17 VITALS — BP 126/54 | HR 16 | Temp 98.4°F | Wt 221.8 lb

## 2012-04-17 DIAGNOSIS — C50519 Malignant neoplasm of lower-outer quadrant of unspecified female breast: Secondary | ICD-10-CM

## 2012-04-17 DIAGNOSIS — C50919 Malignant neoplasm of unspecified site of unspecified female breast: Secondary | ICD-10-CM

## 2012-04-17 DIAGNOSIS — C7952 Secondary malignant neoplasm of bone marrow: Secondary | ICD-10-CM

## 2012-04-17 DIAGNOSIS — C7951 Secondary malignant neoplasm of bone: Secondary | ICD-10-CM

## 2012-04-17 DIAGNOSIS — Z5111 Encounter for antineoplastic chemotherapy: Secondary | ICD-10-CM

## 2012-04-17 LAB — CANCER ANTIGEN 27.29: CA 27.29: 104 U/mL — ABNORMAL HIGH (ref 0–39)

## 2012-04-17 MED ORDER — FULVESTRANT 250 MG/5ML IM SOLN
500.0000 mg | INTRAMUSCULAR | Status: DC
  Administered 2012-04-17: 500 mg via INTRAMUSCULAR
  Filled 2012-04-17: qty 10

## 2012-04-17 NOTE — Progress Notes (Signed)
Kristen Parker presents today for injection per MD orders. Faslodex 500mg  administered IM in right and left gluteal in divided doses. Administration without incident. Patient tolerated well.

## 2012-04-30 ENCOUNTER — Ambulatory Visit (HOSPITAL_COMMUNITY): Payer: Medicare Other

## 2012-05-01 ENCOUNTER — Other Ambulatory Visit (HOSPITAL_COMMUNITY): Payer: Self-pay | Admitting: Oncology

## 2012-05-01 DIAGNOSIS — C7951 Secondary malignant neoplasm of bone: Secondary | ICD-10-CM

## 2012-05-01 DIAGNOSIS — C50919 Malignant neoplasm of unspecified site of unspecified female breast: Secondary | ICD-10-CM

## 2012-05-01 MED ORDER — HYDROCODONE-ACETAMINOPHEN 5-500 MG PO TABS: 1.0000 | ORAL_TABLET | ORAL | Status: DC | PRN

## 2012-05-08 ENCOUNTER — Encounter (INDEPENDENT_AMBULATORY_CARE_PROVIDER_SITE_OTHER): Payer: Self-pay | Admitting: Surgery

## 2012-05-08 ENCOUNTER — Encounter (HOSPITAL_COMMUNITY): Payer: Medicare Other | Attending: Oncology

## 2012-05-08 ENCOUNTER — Ambulatory Visit (INDEPENDENT_AMBULATORY_CARE_PROVIDER_SITE_OTHER): Payer: Medicare Other | Admitting: Surgery

## 2012-05-08 ENCOUNTER — Other Ambulatory Visit (HOSPITAL_COMMUNITY): Payer: Medicare Other

## 2012-05-08 VITALS — BP 133/78 | HR 96 | Temp 97.0°F

## 2012-05-08 VITALS — BP 150/86 | HR 99 | Temp 98.0°F | Resp 16 | Ht 65.0 in | Wt 222.6 lb

## 2012-05-08 DIAGNOSIS — C50919 Malignant neoplasm of unspecified site of unspecified female breast: Secondary | ICD-10-CM

## 2012-05-08 DIAGNOSIS — C7951 Secondary malignant neoplasm of bone: Secondary | ICD-10-CM | POA: Insufficient documentation

## 2012-05-08 DIAGNOSIS — M7989 Other specified soft tissue disorders: Secondary | ICD-10-CM | POA: Insufficient documentation

## 2012-05-08 DIAGNOSIS — C50519 Malignant neoplasm of lower-outer quadrant of unspecified female breast: Secondary | ICD-10-CM

## 2012-05-08 DIAGNOSIS — C7952 Secondary malignant neoplasm of bone marrow: Secondary | ICD-10-CM

## 2012-05-08 DIAGNOSIS — C801 Malignant (primary) neoplasm, unspecified: Secondary | ICD-10-CM | POA: Insufficient documentation

## 2012-05-08 LAB — COMPREHENSIVE METABOLIC PANEL
ALT: 11 U/L (ref 0–35)
AST: 21 U/L (ref 0–37)
Alkaline Phosphatase: 103 U/L (ref 39–117)
CO2: 21 mEq/L (ref 19–32)
Calcium: 9.4 mg/dL (ref 8.4–10.5)
GFR calc non Af Amer: >90 mL/min (ref >90)
Potassium: 3.7 mEq/L (ref 3.5–5.1)
Sodium: 138 mEq/L (ref 135–145)
Total Protein: 6.7 g/dL (ref 6.0–8.3)

## 2012-05-08 MED ORDER — SODIUM CHLORIDE 0.9 % IJ SOLN
10.0000 mL | INTRAMUSCULAR | Status: DC | PRN
  Administered 2012-05-08: 10 mL
  Filled 2012-05-08: qty 10

## 2012-05-08 MED ORDER — ZOLEDRONIC ACID 4 MG/5ML IV CONC
4.0000 mg | Freq: Once | INTRAVENOUS | Status: AC
  Administered 2012-05-08: 4 mg via INTRAVENOUS
  Filled 2012-05-08: qty 5

## 2012-05-08 MED ORDER — SODIUM CHLORIDE 0.9 % IV SOLN
Freq: Once | INTRAVENOUS | Status: AC
  Administered 2012-05-08: 250 mL via INTRAVENOUS

## 2012-05-08 MED ORDER — HEPARIN SOD (PORK) LOCK FLUSH 100 UNIT/ML IV SOLN
INTRAVENOUS | Status: AC
  Filled 2012-05-08: qty 5

## 2012-05-08 MED ORDER — HEPARIN SOD (PORK) LOCK FLUSH 100 UNIT/ML IV SOLN
500.0000 [IU] | Freq: Once | INTRAVENOUS | Status: AC | PRN
  Administered 2012-05-08: 500 [IU]
  Filled 2012-05-08: qty 5

## 2012-05-08 MED ORDER — SODIUM CHLORIDE 0.9 % IJ SOLN
INTRAMUSCULAR | Status: AC
  Filled 2012-05-08: qty 10

## 2012-05-08 NOTE — Progress Notes (Signed)
NAME: [name]       DOB: 1951/01/21           DATE: 05/08/2012       MRN: [id number]   Kristen Parker is a 61 y.o.Marland Kitchenfemale who presents for routine followup of her Stage IV ILC right breast diagnosed in 2009 and treated with MRM. She has no problems or concerns on either side.She has ongoing issues with bony mets and may be starting chemo soon. Her port has not been working well with problems aspirating.  PFSH: She has had no significant changes since the last visit here.  ROS: There have been no significant changes since the last visit here  EXAM:  VS: BP 150/86  Pulse 99  Temp(Src) 98 F (36.7 C) (Temporal)  Resp 16  Ht 5\' 5"  (1.651 m)  Wt 222 lb 9.6 oz (100.971 kg)  BMI 37.04 kg/m2   General: The patient is alert, oriented, generally healty appearing, NAD. Mood and affect are normal.  Breasts:  Right is s/p MRM. No evidence of recurrence. Left is normal  Lymphatics: She has no axillary or supraclavicular adenopathy on either side.  Extremities: Full ROM of the surgical side. She has RUE lymphedema and is wearing her sleeve  Data Reviewed: Notes from oncologist and last PET scan  Impression: Stable with stage IV persistent breast cancer, although increasing tumor markers may indicate need for chemo  Plan: Will continue to follow up on an annual basis here.

## 2012-05-08 NOTE — Progress Notes (Signed)
Kristen Parker tolerated infusion well and without incident; verbalizes understanding for follow-up.  No distress noted at time of discharge and patient was discharged home with her husband.

## 2012-05-08 NOTE — Patient Instructions (Signed)
See [place] again in a year or sooner if needed

## 2012-05-14 ENCOUNTER — Encounter (HOSPITAL_BASED_OUTPATIENT_CLINIC_OR_DEPARTMENT_OTHER): Payer: Medicare Other

## 2012-05-14 VITALS — BP 129/77 | HR 107 | Temp 99.2°F

## 2012-05-14 DIAGNOSIS — C7951 Secondary malignant neoplasm of bone: Secondary | ICD-10-CM

## 2012-05-14 DIAGNOSIS — Z5111 Encounter for antineoplastic chemotherapy: Secondary | ICD-10-CM

## 2012-05-14 DIAGNOSIS — C50519 Malignant neoplasm of lower-outer quadrant of unspecified female breast: Secondary | ICD-10-CM

## 2012-05-14 DIAGNOSIS — C50919 Malignant neoplasm of unspecified site of unspecified female breast: Secondary | ICD-10-CM

## 2012-05-14 MED ORDER — FULVESTRANT 250 MG/5ML IM SOLN
500.0000 mg | INTRAMUSCULAR | Status: DC
  Administered 2012-05-14: 500 mg via INTRAMUSCULAR
  Filled 2012-05-14: qty 10

## 2012-05-14 NOTE — Progress Notes (Signed)
Kristen Parker presents today for injection per the provider's orders.  Faslodex administered administration without incident; see MAR for injection details.  Patient tolerated procedure well and without incident.  No questions or complaints noted at this time.

## 2012-05-18 ENCOUNTER — Encounter (HOSPITAL_COMMUNITY)
Admission: RE | Admit: 2012-05-18 | Discharge: 2012-05-18 | Disposition: A | Discharge status: Home / Self Care | Payer: Medicare Other | Source: Ambulatory Visit | Attending: Oncology | Admitting: Oncology

## 2012-05-18 DIAGNOSIS — C50919 Malignant neoplasm of unspecified site of unspecified female breast: Secondary | ICD-10-CM | POA: Insufficient documentation

## 2012-05-18 DIAGNOSIS — C7951 Secondary malignant neoplasm of bone: Secondary | ICD-10-CM | POA: Insufficient documentation

## 2012-05-18 DIAGNOSIS — C7952 Secondary malignant neoplasm of bone marrow: Secondary | ICD-10-CM | POA: Insufficient documentation

## 2012-05-18 DIAGNOSIS — Z923 Personal history of irradiation: Secondary | ICD-10-CM | POA: Insufficient documentation

## 2012-05-18 LAB — GLUCOSE, CAPILLARY: Glucose-Capillary: 114 mg/dL — ABNORMAL HIGH (ref 70–99)

## 2012-05-18 MED ORDER — FLUDEOXYGLUCOSE F - 18 (FDG) INJECTION
18.1000 | Freq: Once | INTRAVENOUS | Status: AC | PRN
  Administered 2012-05-18: 18.1 via INTRAVENOUS

## 2012-05-19 ENCOUNTER — Ambulatory Visit (HOSPITAL_COMMUNITY): Payer: Medicare Other

## 2012-05-19 ENCOUNTER — Other Ambulatory Visit (HOSPITAL_COMMUNITY): Payer: Medicare Other

## 2012-05-20 ENCOUNTER — Telehealth (HOSPITAL_COMMUNITY): Payer: Self-pay

## 2012-05-20 NOTE — Telephone Encounter (Signed)
Call from [facility] requesting Dr. Mariel Sleet call her with the results of her PET scan that was done on Monday. Can be reached @ [phone].

## 2012-05-21 ENCOUNTER — Telehealth (INDEPENDENT_AMBULATORY_CARE_PROVIDER_SITE_OTHER): Payer: Self-pay | Admitting: General Surgery

## 2012-05-21 NOTE — Telephone Encounter (Signed)
Called patient and left message on machine for her to call me back.

## 2012-05-21 NOTE — Telephone Encounter (Signed)
[name] is able to be used, but very frustrating and does not work well. Per the patient Dr Mariel Sleet said to replace it. I called patient and she is aware Dr Jamey Ripa is not readily available to do this and he suggested IR in [place] since this could be done quickly there and it is near her home. She will talk with Dr Mariel Sleet on Monday about this since she has an appt and will call us if she needs [place].

## 2012-05-21 NOTE — Telephone Encounter (Signed)
Message copied by Liliana Cline on Thu May 21, 2012  4:48 PM ------      Message from: [name]      Created: Thu May 21, 2012  4:04 PM       I can't d anytime soon - but would do if needed. However, she lives in [place] - don't know if IR will do it there for her so she does not have to come here.       ----- Message -----         From: Liliana Cline, CMA         Sent: 05/21/2012   9:51 AM           To: [name], MD            This was sent to me, will you do a revision?      Have not gotten a message from the cancer center this is not working, just the patient.            [name]            ----- Message -----         From: Zacarias Pontes         Sent: 05/21/2012   9:43 AM           To: Liliana Cline, CMA            Pt was here to weeks ago for br reck...she wants to have a new PAC put in for treatment...does she need another apt first or can she be scheduled for sx   Please  Call her after 12 noon today if you can [phone]

## 2012-05-25 ENCOUNTER — Encounter (HOSPITAL_BASED_OUTPATIENT_CLINIC_OR_DEPARTMENT_OTHER): Payer: Medicare Other | Admitting: Oncology

## 2012-05-25 ENCOUNTER — Encounter (HOSPITAL_COMMUNITY): Payer: Self-pay | Admitting: Oncology

## 2012-05-25 VITALS — BP 133/79 | HR 106 | Temp 97.8°F | Wt 224.2 lb

## 2012-05-25 DIAGNOSIS — C50919 Malignant neoplasm of unspecified site of unspecified female breast: Secondary | ICD-10-CM

## 2012-05-25 DIAGNOSIS — C773 Secondary and unspecified malignant neoplasm of axilla and upper limb lymph nodes: Secondary | ICD-10-CM

## 2012-05-25 DIAGNOSIS — C50519 Malignant neoplasm of lower-outer quadrant of unspecified female breast: Secondary | ICD-10-CM

## 2012-05-25 DIAGNOSIS — C7951 Secondary malignant neoplasm of bone: Secondary | ICD-10-CM

## 2012-05-25 MED ORDER — LORAZEPAM 1 MG PO TABS: 1.0000 mg | ORAL_TABLET | ORAL | Status: AC | PRN

## 2012-05-25 MED ORDER — ONDANSETRON HCL 8 MG PO TABS: ORAL_TABLET | ORAL | Status: AC

## 2012-05-25 MED ORDER — PROCHLORPERAZINE 25 MG RE SUPP: 25.0000 mg | Freq: Four times a day (QID) | RECTAL | Status: AC | PRN

## 2012-05-25 NOTE — Progress Notes (Signed)
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 She responded to for marrow initially with a great response then tamoxifen with a mild response and on Faslodex has essentially had no response. We are now embarking on a chemotherapy regimen consisting of docetaxel 75 mg per meter squared every 3 weeks and after 3 cycles or 4 we'll reevaluate her. She will need a PET scan and her CA 27.29 level.  We spent 30+ minutes gone over her scans answering questions counseling her on therapy. This included her cell her husband and her youngest son. She is well aware of the fact that this is not curable and that chemotherapy is not always the best as far as responsiveness of lobular carcinomas. We will however try chemotherapy and see if we get response.

## 2012-05-25 NOTE — Patient Instructions (Signed)
[facility] Kristen Parker  [id number] [birth year] Dr. Glenford Peers     CHEMOTHERAPY INSTRUCTIONS  Taxotere - bone marrow suppression (lowers white blood cells (fight infection), lowers red blood cells (make up your blood), lowers platelets (help blood to clot). This chemo can cause fluid retention. You will be responsible for taking a steroid called Dexamethasone at home prior to and after Taxotere. This steroid will keep you from having fluid retention. Take it whether you think you need it or not. Can cause hair loss, skin/nail changes (darkening of the nail beds, pain where the nail bed meets the skin, loosening of the nail beds, dry skin, palms of hands and soles of feet may darken or get sensitive, nausea/vomiting, paresthesia (numbness or tingling) in extremities - we need to know if this develops, mucositis (inflammation of any mucosal membrane - the mouth, throat), mouth sores, neurotoxicity (loss of memory, headaches, trouble sleeping, etc.), can also cause excessive tear production. Please let us know if any side effect develops.   Neulasta - (not chemo) but given 20-24 hours after chemo to boost your body's white blood cells. White blood cells are what protect your body from infection. Chemo kills cancer cells and healthy cells. And so your white blood cells get destroyed in the process. This is why you need the Neulasta injection. The most common side effect reported with this injection is bone pain. Some patients have it and some do not have it at all. If you develop bone pain, it can occur as soon as a few hours after the injection to several days after the injection. And the bone pain can last several days. Please start taking Aleve or Ibuprofen as prescribed on the bottle for the bone pain. This is the drug of choice. There is no other drug that will work as good as the Aleve or Ibuprofen. Please take this medication with food if you have to take. Also,  we need to know if you develop bone pain and what the severity of it is.    POTENTIAL SIDE EFFECTS OF TREATMENT: Increased Susceptibility to Infection, Vomiting, Constipation, Hair Thinning, Changes in Character of Skin and Nails (brittleness, dryness,etc.), Pigment Changes (darkening of nail beds, palms of hands, soles of feet, etc.), Bone Marrow Suppression, Abdominal Cramping, Urinary Frequency and Blood in Urine, Hair Loss   SELF IMAGE NEEDS AND REFERRALS MADE: Obtain hair accessories as soon as possible (wigs, scarves, turbans,caps,etc.) and Referral to Look Good, Feel Better consultant   EDUCATIONAL MATERIALS GIVEN AND REVIEWED: Chemotherapy and You   SELF CARE ACTIVITIES WHILE ON CHEMOTHERAPY: Increase your fluid intake 48 hours prior to treatment and drink at least 2 quarts (64 oz) per day after treatment., No alcohol intake., No aspirin or other medications unless approved by your oncologist., Eat foods that are light and easy to digest., Eat foods at cold or room temperature., No fried, fatty, or spicy foods immediately before or after treatment., Have teeth cleaned professionally before starting treatment. Keep dentures and partial plates clean., Use soft toothbrush and do not use mouthwashes that contain alcohol. Biotene is a good mouthwash that is available at most pharmacies or may be ordered by calling [phone]., Use warm salt water gargles (1 teaspoon salt per 1 quart warm water) before and after meals and at bedtime. Or you may rinse with 2 tablespoons of three -percent hydrogen peroxide mixed in eight ounces of water., Always use sunscreen with SPF (Sun Protection Factor) of 30  or higher., Use your nausea medication as directed to prevent nausea., Use your stool softener or laxative as directed to prevent constipation. and Use your anti-diarrheal medication as directed to stop diarrhea.   Please wash your hands for at least 30 seconds using warm soapy water. Handwashing is  the #1 way to prevent the spread of germs. Stay away from sick people or people who are getting over a cold. If you develop respiratory systems such as green/yellow mucus production or productive cough or persistent cough let us know and we will see if you need an antibiotic. It is a good idea to keep a pair of gloves on when going into grocery stores/[employer] to decrease your risk of coming into contact with germs on the carts, etc. Carry alcohol hand gel with you at all times and use it frequently if out in public. All foods need to be cooked thoroughly. No raw foods. No medium or undercooked meats, eggs. If your food is cooked medium well, it does not need to be hot pink or saturated with bloody liquid at all. Vegetables and fruits need to be washed/rinsed under the faucet with a dish detergent before being consumed. You can eat raw fruits and vegetables unless we tell you otherwise but it would be best if you cooked them or bought frozen. Do not eat off of salad bars or hot bars unless you really trust the cleanliness of the restaurant. If you need dental work, please let Dr. Mariel Sleet know before you go for your appointment so that we can coordinate the best possible time for you in regards to your chemo regimen. You need to also let your dentist know that you are actively taking chemo. We may need to do labs prior to your dental appointment. We also want your bowels moving at least every other day. If this is not happening, we need to know so that we can get you on a bowel regimen to help you go.      MEDICATIONS:  Dexamethasone 4 mg tablet. The day before, day of, and day after chemo take 2 tablets twice a day. This is to decrease your risk of having fluid retention from the Taxotere.  Zofran 8mg  tablet. Starting the day after chemo, take 1 tablet twice a day for 2 days. Then may take twice a day if needed for nausea/vomiting.  Ativan 1 mg tablet. May take 1 tablet every 4 hours if needed for  nausea/vomiting. Do not take this at the same time as your Clonazepam as this will increase drowsiness!!!! If you do not think you need the whole tablet may cut the Ativan in half. The Ativan tablet is tiny. Also, do not drive while taking this medication.  Compazine 25mg  suppository. Insert 1 rectally every 12 hours if needed for nausea/vomiting.   EMLA cream refills called in.   SYMPTOMS TO REPORT AS SOON AS POSSIBLE AFTER TREATMENT:  FEVER GREATER THAN 100.5 F  CHILLS WITH OR WITHOUT FEVER  NAUSEA AND VOMITING THAT IS NOT CONTROLLED WITH YOUR NAUSEA MEDICATION  UNUSUAL SHORTNESS OF BREATH  UNUSUAL BRUISING OR BLEEDING  TENDERNESS IN MOUTH AND THROAT WITH OR WITHOUT PRESENCE OF ULCERS  URINARY PROBLEMS  BOWEL PROBLEMS  UNUSUAL RASH    Wear comfortable clothing and clothing appropriate for easy access to any Portacath or PICC line. Let us know if there is anything that we can do to make your therapy better!      I have been informed and understand all  of the instructions given to me and have received a copy. I have been instructed to call the clinic [phone] or my family physician as soon as possible for continued medical care, if indicated. I do not have any more questions at this time but understand that I may call the Cancer Center or the Patient Navigator at [phone] during office hours should I have questions or need assistance in obtaining follow-up care.      _________________________________________      _______________     __________ Signature of Patient or Authorized Representative        Date                            Time      _________________________________________ Nurse's Signature

## 2012-05-25 NOTE — Patient Instructions (Signed)
[facility]  Discharge Instructions Kristen Parker  [id number] [birth year] Dr. Glenford Peers  RECOMMENDATIONS MADE BY THE CONSULTANT AND ANY TEST RESULTS WILL BE SENT TO YOUR REFERRING DOCTOR.   EXAM FINDINGS BY MD TODAY AND SIGNS AND SYMPTOMS TO REPORT TO CLINIC OR PRIMARY MD:  We will stop faslodex and start taxotere on Wednesday 07/27/2012.  MEDICATIONS PRESCRIBED:  Decadron 4 mg tablets --take 2 by mouth twice a day the day before, day of, and day after chemo.. You will do this each cycle. Follow label directions  INSTRUCTIONS GIVEN AND DISCUSSED:  Medication Instruction Sheets Taxotere- Taxotere - bone marrow suppression (lowers white blood cells (fight infection), lowers red blood cells (make up your blood), lowers platelets (help blood to clot). This chemo can cause fluid retention. You will be responsible for taking a steroid called Dexamethasone at home prior to and after Taxotere. This steroid will keep you from having fluid retention. Take it whether you think you need it or not. Can cause hair loss, skin/nail changes (darkening of the nail beds, pain where the nail bed meets the skin, loosening of the nail beds, dry skin, palms of hands and soles of feet may darken or get sensitive, nausea/vomiting, paresthesia (numbness or tingling) in extremities - we need to know if this develops, mucositis (inflammation of any mucosal membrane - the mouth, throat), mouth sores, neurotoxicity (loss of memory, headaches, trouble sleeping, etc.), can also cause excessive tear production. Please let us know if any side effect develops.   SPECIAL INSTRUCTIONS/FOLLOW-UP: We will also be calling in some other nausea meds for you to take after chemo and Rolly Salter will go over those with you.   I acknowledge that I have been informed and understand all the instructions given to me and received a copy. I do not have any more questions at this time, but understand that I may call  the Specialty Clinic at [facility] at [phone] during business hours should I have any further questions or need assistance in obtaining follow-up care.    __________________________________________  _____________  __________ Signature of Patient or Authorized Representative            Date                   Time    __________________________________________ Nurse's Signature

## 2012-05-26 ENCOUNTER — Telehealth (HOSPITAL_COMMUNITY): Payer: Self-pay | Admitting: Oncology

## 2012-05-26 NOTE — Telephone Encounter (Signed)
Per [facility] cpt code [place] Berkley Harvey is not needed for pts chemo regimen  April Manson Database administrator Medical Oncology [phone]

## 2012-05-27 ENCOUNTER — Encounter (HOSPITAL_BASED_OUTPATIENT_CLINIC_OR_DEPARTMENT_OTHER): Payer: Medicare Other

## 2012-05-27 VITALS — BP 120/104 | HR 108 | Temp 99.7°F | Wt 221.0 lb

## 2012-05-27 DIAGNOSIS — C50519 Malignant neoplasm of lower-outer quadrant of unspecified female breast: Secondary | ICD-10-CM

## 2012-05-27 DIAGNOSIS — C7951 Secondary malignant neoplasm of bone: Secondary | ICD-10-CM

## 2012-05-27 DIAGNOSIS — C50919 Malignant neoplasm of unspecified site of unspecified female breast: Secondary | ICD-10-CM

## 2012-05-27 DIAGNOSIS — Z5111 Encounter for antineoplastic chemotherapy: Secondary | ICD-10-CM

## 2012-05-27 DIAGNOSIS — C773 Secondary and unspecified malignant neoplasm of axilla and upper limb lymph nodes: Secondary | ICD-10-CM

## 2012-05-27 MED ORDER — HEPARIN SOD (PORK) LOCK FLUSH 100 UNIT/ML IV SOLN
INTRAVENOUS | Status: AC
  Filled 2012-05-27: qty 5

## 2012-05-27 MED ORDER — DEXAMETHASONE SODIUM PHOSPHATE 10 MG/ML IJ SOLN: 10.0000 mg | Freq: Once | INTRAMUSCULAR | Status: DC

## 2012-05-27 MED ORDER — DOCETAXEL CHEMO INJECTION 160 MG/16ML
75.0000 mg/m2 | Freq: Once | INTRAVENOUS | Status: AC
  Administered 2012-05-27: 160 mg via INTRAVENOUS
  Filled 2012-05-27: qty 16

## 2012-05-27 MED ORDER — SODIUM CHLORIDE 0.9 % IV SOLN
Freq: Once | INTRAVENOUS | Status: AC
  Administered 2012-05-27: 8 mg via INTRAVENOUS
  Filled 2012-05-27: qty 4

## 2012-05-27 MED ORDER — SODIUM CHLORIDE 0.9 % IJ SOLN
10.0000 mL | INTRAMUSCULAR | Status: DC | PRN
  Administered 2012-05-27: 10 mL
  Filled 2012-05-27: qty 10

## 2012-05-27 MED ORDER — SODIUM CHLORIDE 0.9 % IV SOLN: 8.0000 mg | Freq: Once | INTRAVENOUS | Status: DC

## 2012-05-27 MED ORDER — SODIUM CHLORIDE 0.9 % IV SOLN
Freq: Once | INTRAVENOUS | Status: AC
  Administered 2012-05-27: 09:00:00 via INTRAVENOUS

## 2012-05-27 MED ORDER — HEPARIN SOD (PORK) LOCK FLUSH 100 UNIT/ML IV SOLN
500.0000 [IU] | Freq: Once | INTRAVENOUS | Status: AC | PRN
  Administered 2012-05-27: 500 [IU]
  Filled 2012-05-27: qty 5

## 2012-05-27 MED ORDER — SODIUM CHLORIDE 0.9 % IJ SOLN
INTRAMUSCULAR | Status: AC
  Filled 2012-05-27: qty 10

## 2012-05-27 NOTE — Progress Notes (Signed)
taxotere begun at 63 ml/hr per protocol for five minutes, then ten more minutes. Rate increased to 125 ml/hr for 15 mins. Then to 188 ml/hr for 15 mins, ending with250 ml /hr til complete.

## 2012-05-27 NOTE — Progress Notes (Signed)
Tolerated chemo well. Spouse here with pt today.

## 2012-05-27 NOTE — Progress Notes (Signed)
Chemo teaching done and consent signed for Taxotere.

## 2012-05-28 ENCOUNTER — Encounter (HOSPITAL_BASED_OUTPATIENT_CLINIC_OR_DEPARTMENT_OTHER): Payer: Medicare Other

## 2012-05-28 VITALS — BP 136/65 | HR 118 | Temp 98.7°F

## 2012-05-28 DIAGNOSIS — Z5189 Encounter for other specified aftercare: Secondary | ICD-10-CM

## 2012-05-28 DIAGNOSIS — C773 Secondary and unspecified malignant neoplasm of axilla and upper limb lymph nodes: Secondary | ICD-10-CM

## 2012-05-28 DIAGNOSIS — C50919 Malignant neoplasm of unspecified site of unspecified female breast: Secondary | ICD-10-CM

## 2012-05-28 DIAGNOSIS — C50519 Malignant neoplasm of lower-outer quadrant of unspecified female breast: Secondary | ICD-10-CM

## 2012-05-28 DIAGNOSIS — C7952 Secondary malignant neoplasm of bone marrow: Secondary | ICD-10-CM

## 2012-05-28 MED ORDER — PEGFILGRASTIM INJECTION 6 MG/0.6ML
SUBCUTANEOUS | Status: AC
  Filled 2012-05-28: qty 0.6

## 2012-05-28 MED ORDER — PEGFILGRASTIM INJECTION 6 MG/0.6ML
6.0000 mg | Freq: Once | SUBCUTANEOUS | Status: AC
  Administered 2012-05-28: 6 mg via SUBCUTANEOUS

## 2012-05-28 NOTE — Progress Notes (Signed)
Kristen Parker presents today for injection per MD orders. Neulasta 6mg administered SQ in right Abdomen. Administration without incident. Patient tolerated well.  

## 2012-06-01 ENCOUNTER — Other Ambulatory Visit (HOSPITAL_COMMUNITY): Payer: Self-pay | Admitting: Oncology

## 2012-06-01 DIAGNOSIS — C7951 Secondary malignant neoplasm of bone: Secondary | ICD-10-CM

## 2012-06-01 DIAGNOSIS — C50919 Malignant neoplasm of unspecified site of unspecified female breast: Secondary | ICD-10-CM

## 2012-06-01 MED ORDER — FIRST-DUKES MOUTHWASH MT SUSP: 5.0000 mL | Freq: Four times a day (QID) | OROMUCOSAL | Status: DC

## 2012-06-02 ENCOUNTER — Other Ambulatory Visit (HOSPITAL_COMMUNITY): Payer: Self-pay | Admitting: Oncology

## 2012-06-02 DIAGNOSIS — C7951 Secondary malignant neoplasm of bone: Secondary | ICD-10-CM

## 2012-06-02 DIAGNOSIS — C50919 Malignant neoplasm of unspecified site of unspecified female breast: Secondary | ICD-10-CM

## 2012-06-02 MED ORDER — HYDROCODONE-ACETAMINOPHEN 5-500 MG PO TABS: 1.0000 | ORAL_TABLET | ORAL | Status: DC | PRN

## 2012-06-03 ENCOUNTER — Encounter (HOSPITAL_COMMUNITY): Payer: Medicare Other | Attending: Oncology | Admitting: Oncology

## 2012-06-03 VITALS — BP 150/78 | HR 128 | Temp 97.7°F

## 2012-06-03 DIAGNOSIS — C801 Malignant (primary) neoplasm, unspecified: Secondary | ICD-10-CM | POA: Insufficient documentation

## 2012-06-03 DIAGNOSIS — R509 Fever, unspecified: Secondary | ICD-10-CM | POA: Insufficient documentation

## 2012-06-03 DIAGNOSIS — B3731 Acute candidiasis of vulva and vagina: Secondary | ICD-10-CM | POA: Insufficient documentation

## 2012-06-03 DIAGNOSIS — L298 Other pruritus: Secondary | ICD-10-CM

## 2012-06-03 DIAGNOSIS — C7951 Secondary malignant neoplasm of bone: Secondary | ICD-10-CM | POA: Insufficient documentation

## 2012-06-03 DIAGNOSIS — B373 Candidiasis of vulva and vagina: Secondary | ICD-10-CM

## 2012-06-03 DIAGNOSIS — C50919 Malignant neoplasm of unspecified site of unspecified female breast: Secondary | ICD-10-CM | POA: Insufficient documentation

## 2012-06-03 DIAGNOSIS — N390 Urinary tract infection, site not specified: Secondary | ICD-10-CM | POA: Insufficient documentation

## 2012-06-03 MED ORDER — CLOTRIMAZOLE-BETAMETHASONE 1-0.05 % EX CREA: TOPICAL_CREAM | Freq: Two times a day (BID) | CUTANEOUS | Status: AC

## 2012-06-03 MED ORDER — FLUCONAZOLE 100 MG PO TABS: 100.0000 mg | ORAL_TABLET | Freq: Every day | ORAL | Status: AC

## 2012-06-03 NOTE — Progress Notes (Signed)
PRN visit c/o rash on neck and irritation to perineum.

## 2012-06-03 NOTE — Progress Notes (Signed)
Kristen Parker is seen as a work-in today.  She recently started Taxotere chemotherapy and her 1st cycle was on 05/27/2012.  She reports a right frontal side of neck rash that is pruritic.  Physical exam reveals a erythematous telangectasic rash that is blanchable.  No warmth appreciated.    She also reports vaginal pain with urination.  She denies any fevers, chills, low back pain, and suprapubic pain.  She denies any hematuria.  She reports that the labias burn with urination.  She does have an overactive bladder that leads to urine leaking.  She has tried "fanny cream" without improvement.  Physical exam reveals white cream laden labias with two open ulcerations B/L on the medial side of labias on the interior folds.  No discharge appreciated.  I prescribed Diflucan 100 mg PO x 7 days and Lotrisone cream to be applied BID to affected area.  If this does not resolve in 7 days, she is to follow-up with urologist.   Patient and plan discussed with Dr. Glenford Parker and he is in agreement with the aforementioned.  Patient will return as scheduled.   Kristen Parker

## 2012-06-03 NOTE — Patient Instructions (Signed)
[facility]  Discharge Instructions  RECOMMENDATIONS MADE BY THE CONSULTANT AND ANY TEST RESULTS WILL BE SENT TO YOUR REFERRING DOCTOR.   RX given for Diflucan by mouth and Lotrisone cream to affected area. If no improvement after 1 week you will need to see a urologist.   I acknowledge that I have been informed and understand all the instructions given to me and received a copy. I do not have any more questions at this time, but understand that I may call the Specialty Clinic at [facility] at [phone] during business hours should I have any further questions or need assistance in obtaining follow-up care.    __________________________________________  _____________  __________ Signature of Patient or Authorized Representative            Date                   Time    __________________________________________ Nurse's Signature

## 2012-06-08 ENCOUNTER — Ambulatory Visit (HOSPITAL_COMMUNITY): Payer: Medicare Other

## 2012-06-11 ENCOUNTER — Encounter (HOSPITAL_BASED_OUTPATIENT_CLINIC_OR_DEPARTMENT_OTHER): Payer: Medicare Other

## 2012-06-11 VITALS — BP 124/38 | HR 109 | Temp 98.3°F

## 2012-06-11 DIAGNOSIS — Z452 Encounter for adjustment and management of vascular access device: Secondary | ICD-10-CM

## 2012-06-11 DIAGNOSIS — C50519 Malignant neoplasm of lower-outer quadrant of unspecified female breast: Secondary | ICD-10-CM

## 2012-06-11 DIAGNOSIS — C50919 Malignant neoplasm of unspecified site of unspecified female breast: Secondary | ICD-10-CM

## 2012-06-11 DIAGNOSIS — C7952 Secondary malignant neoplasm of bone marrow: Secondary | ICD-10-CM

## 2012-06-11 DIAGNOSIS — C7951 Secondary malignant neoplasm of bone: Secondary | ICD-10-CM

## 2012-06-11 LAB — COMPREHENSIVE METABOLIC PANEL
Albumin: 3.5 g/dL (ref 3.5–5.2)
Alkaline Phosphatase: 116 U/L (ref 39–117)
BUN: 21 mg/dL (ref 6–23)
Chloride: 103 mEq/L (ref 96–112)
Glucose, Bld: 109 mg/dL — ABNORMAL HIGH (ref 70–99)
Potassium: 3.6 mEq/L (ref 3.5–5.1)
Total Bilirubin: 0.2 mg/dL — ABNORMAL LOW (ref 0.3–1.2)

## 2012-06-11 MED ORDER — SODIUM CHLORIDE 0.9 % IV SOLN
Freq: Once | INTRAVENOUS | Status: AC
  Administered 2012-06-11: 14:00:00 via INTRAVENOUS

## 2012-06-11 MED ORDER — ALTEPLASE 2 MG IJ SOLR
2.0000 mg | Freq: Once | INTRAMUSCULAR | Status: AC | PRN
  Administered 2012-06-11: 2 mg
  Filled 2012-06-11: qty 2

## 2012-06-11 MED ORDER — SODIUM CHLORIDE 0.9 % IV SOLN
4.0000 mg | Freq: Once | INTRAVENOUS | Status: AC
  Administered 2012-06-11: 4 mg via INTRAVENOUS
  Filled 2012-06-11: qty 5

## 2012-06-11 MED ORDER — ALTEPLASE 2 MG IJ SOLR
INTRAMUSCULAR | Status: AC
  Filled 2012-06-11: qty 2

## 2012-06-12 ENCOUNTER — Encounter (HOSPITAL_COMMUNITY): Payer: Medicare Other

## 2012-06-12 ENCOUNTER — Encounter (HOSPITAL_BASED_OUTPATIENT_CLINIC_OR_DEPARTMENT_OTHER): Payer: Medicare Other

## 2012-06-12 DIAGNOSIS — C50919 Malignant neoplasm of unspecified site of unspecified female breast: Secondary | ICD-10-CM

## 2012-06-12 MED ORDER — SODIUM CHLORIDE 0.9 % IJ SOLN
10.0000 mL | Freq: Once | INTRAMUSCULAR | Status: AC
  Administered 2012-06-12: 10 mL via INTRAVENOUS
  Filled 2012-06-12: qty 10

## 2012-06-12 MED ORDER — HEPARIN SOD (PORK) LOCK FLUSH 100 UNIT/ML IV SOLN
INTRAVENOUS | Status: AC
  Filled 2012-06-12: qty 5

## 2012-06-12 MED ORDER — HEPARIN SOD (PORK) LOCK FLUSH 100 UNIT/ML IV SOLN
500.0000 [IU] | Freq: Once | INTRAVENOUS | Status: AC
  Administered 2012-06-12: 500 [IU] via INTRAVENOUS
  Filled 2012-06-12: qty 5

## 2012-06-12 MED ORDER — SODIUM CHLORIDE 0.9 % IJ SOLN
INTRAMUSCULAR | Status: AC
  Filled 2012-06-12: qty 10

## 2012-06-12 NOTE — Progress Notes (Signed)
Withdrew alteplase and 7ml blood from port. Blood return still slightly sluggish. Flushed port per protocol. Tolerated well.

## 2012-06-14 ENCOUNTER — Emergency Department (HOSPITAL_COMMUNITY): Payer: Medicare Other

## 2012-06-14 ENCOUNTER — Emergency Department (HOSPITAL_COMMUNITY)
Admission: EM | Admit: 2012-06-14 | Discharge: 2012-06-14 | Disposition: A | Discharge status: Home / Self Care | Payer: Medicare Other | Attending: Emergency Medicine | Admitting: Emergency Medicine

## 2012-06-14 ENCOUNTER — Other Ambulatory Visit: Payer: Self-pay

## 2012-06-14 ENCOUNTER — Encounter (HOSPITAL_COMMUNITY): Payer: Self-pay | Admitting: Emergency Medicine

## 2012-06-14 DIAGNOSIS — Z853 Personal history of malignant neoplasm of breast: Secondary | ICD-10-CM | POA: Insufficient documentation

## 2012-06-14 DIAGNOSIS — E119 Type 2 diabetes mellitus without complications: Secondary | ICD-10-CM | POA: Insufficient documentation

## 2012-06-14 DIAGNOSIS — R0602 Shortness of breath: Secondary | ICD-10-CM | POA: Insufficient documentation

## 2012-06-14 DIAGNOSIS — E079 Disorder of thyroid, unspecified: Secondary | ICD-10-CM | POA: Insufficient documentation

## 2012-06-14 DIAGNOSIS — I10 Essential (primary) hypertension: Secondary | ICD-10-CM | POA: Insufficient documentation

## 2012-06-14 DIAGNOSIS — R110 Nausea: Secondary | ICD-10-CM | POA: Insufficient documentation

## 2012-06-14 DIAGNOSIS — Z79899 Other long term (current) drug therapy: Secondary | ICD-10-CM | POA: Insufficient documentation

## 2012-06-14 DIAGNOSIS — R079 Chest pain, unspecified: Secondary | ICD-10-CM

## 2012-06-14 DIAGNOSIS — E0500 Thyrotoxicosis with diffuse goiter without thyrotoxic crisis or storm: Secondary | ICD-10-CM | POA: Insufficient documentation

## 2012-06-14 LAB — COMPREHENSIVE METABOLIC PANEL
BUN: 21 mg/dL (ref 6–23)
CO2: 22 mEq/L (ref 19–32)
Calcium: 9.2 mg/dL (ref 8.4–10.5)
Creatinine, Ser: 0.97 mg/dL (ref 0.50–1.10)
GFR calc Af Amer: 73 mL/min — ABNORMAL LOW (ref >90)
GFR calc non Af Amer: 63 mL/min — ABNORMAL LOW (ref >90)
Glucose, Bld: 165 mg/dL — ABNORMAL HIGH (ref 70–99)

## 2012-06-14 LAB — CBC
HCT: 32.6 % — ABNORMAL LOW (ref 36.0–46.0)
Hemoglobin: 10.9 g/dL — ABNORMAL LOW (ref 12.0–15.0)
MCH: 31.4 pg (ref 26.0–34.0)
MCV: 93.9 fL (ref 78.0–100.0)
RBC: 3.47 MIL/uL — ABNORMAL LOW (ref 3.87–5.11)

## 2012-06-14 LAB — POCT I-STAT TROPONIN I: Troponin i, poc: 0 ng/mL (ref 0.00–0.08)

## 2012-06-14 LAB — D-DIMER, QUANTITATIVE: D-Dimer, Quant: 0.9 ug/mL-FEU — ABNORMAL HIGH (ref 0.00–0.48)

## 2012-06-14 MED ORDER — MORPHINE SULFATE 4 MG/ML IJ SOLN
4.0000 mg | Freq: Once | INTRAMUSCULAR | Status: AC
  Administered 2012-06-14: 4 mg via INTRAVENOUS
  Filled 2012-06-14: qty 1

## 2012-06-14 MED ORDER — ONDANSETRON HCL 4 MG/2ML IJ SOLN: 4.0000 mg | Freq: Once | INTRAMUSCULAR | Status: DC

## 2012-06-14 MED ORDER — IOHEXOL 350 MG/ML SOLN
100.0000 mL | Freq: Once | INTRAVENOUS | Status: AC | PRN
  Administered 2012-06-14: 100 mL via INTRAVENOUS

## 2012-06-14 MED ORDER — HYDROMORPHONE HCL PF 1 MG/ML IJ SOLN: 1.0000 mg | Freq: Once | INTRAMUSCULAR | Status: DC

## 2012-06-14 MED ORDER — HYDROMORPHONE HCL PF 1 MG/ML IJ SOLN
1.0000 mg | Freq: Once | INTRAMUSCULAR | Status: AC
  Administered 2012-06-14: 1 mg via INTRAVENOUS
  Filled 2012-06-14: qty 1

## 2012-06-14 MED ORDER — OXYCODONE-ACETAMINOPHEN 5-325 MG PO TABS: 1.0000 | ORAL_TABLET | Freq: Four times a day (QID) | ORAL | Status: AC | PRN

## 2012-06-14 NOTE — ED Provider Notes (Signed)
History  This chart was scribed for Benny Lennert, MD by Ladona Ridgel Day. This patient was seen in room APA18/APA18 and the patient's care was started at 1819.  CSN: [id number]  Arrival date & time 06/14/12  4098   First MD Initiated Contact with Patient 06/14/12 [phone]      Chief Complaint  Patient presents with  . Chest Pain    Patient is a 61 y.o. female presenting with chest pain. The history is provided by the patient. No language interpreter was used.  Chest Pain The chest pain began 3 - 5 hours ago. Duration of episode(s) is 3 hours. Chest pain occurs constantly. The chest pain is unchanged. The quality of the pain is described as aching. Primary symptoms include shortness of breath (Mild SOB) and nausea. Pertinent negatives for primary symptoms include no fever, no fatigue, no cough, no abdominal pain, no vomiting and no dizziness.  Pertinent negatives for associated symptoms include no diaphoresis, no numbness and no weakness. She tried nothing for the symptoms. Risk factors include obesity.  Her past medical history is significant for hypertension.    Kristen Parker is a 61 y.o. female who presents to the Emergency Department complaining of intermittent right sided chest pain which began this morning and lasting for 3 hours.  She states modifying factors as deep breathing aggravates her pain symptoms. She also states SOB and nausea as associated symptoms. She denies any fever, emesis, and congestion/cough at this time. She is currently being treated for stage 4 right sided breast CA by Dr. Pamala Duffel.   Past Medical History  Diagnosis Date  . Diabetes mellitus   . Graves disease   . Cancer     breast  . Breast CA 05/22/2011  . Thyroid disease     Graves disease/iodine ablation 16 yrs ago  . Hypertension   . Radiation   . Bone metastases     T-spine  . Bone metastases 06/20/2011    Past Surgical History  Procedure Date  . Breast biopsy     left  . Axillary node dissection       biopsy  . Breast biopsy     right  . Eye surgery     laser retinal tears  . Acdf   . Cervical disc surgery     ant. fusion with plate and screws  . Cholecystectomy   . Cesarean section   . Tubal ligation   . Reproductive     Removal of rt reproductive system  . Dilatation and currettage   . Mastectomy     rt. radical  29 lymph nodes  . Portacath placement 2009  . Appendectomy   . Partial hysterectomy     Family History  Problem Relation Age of Onset  . Cancer Father     Lung, Prostate  . COPD Father   . Heart disease Father     Congestive heart failure  . Cancer Mother     Cervical  . Cancer Paternal Aunt     Breast  . Cancer Paternal Aunt     Ovarian    History  Substance Use Topics  . Smoking status: Former Smoker -- 0.5 packs/day for 30 years    Types: Cigarettes    Quit date: 08/10/2008  . Smokeless tobacco: Never Used  . Alcohol Use: No    OB History    Grav Para Term Preterm Abortions TAB SAB Ect Mult Living   2 2 2  2      Review of Systems  Constitutional: Negative for fever, chills, diaphoresis and fatigue.  HENT: Negative for congestion, rhinorrhea and sneezing.   Eyes: Negative.   Respiratory: Positive for shortness of breath (Mild SOB). Negative for cough and chest tightness.   Cardiovascular: Positive for chest pain (Right sided chest pain. ). Negative for leg swelling.  Gastrointestinal: Positive for nausea. Negative for vomiting, abdominal pain, diarrhea and blood in stool.  Genitourinary: Negative for frequency, hematuria, flank pain and difficulty urinating.  Musculoskeletal: Negative for back pain and arthralgias.  Skin: Negative for rash.  Neurological: Negative for dizziness, speech difficulty, weakness, numbness and headaches.  All other systems reviewed and are negative.    Allergies  Betadine; Ansaid; Erythromycin; and Tape  Home Medications   Current Outpatient Rx  Name Route Sig Dispense Refill  . ACETAMINOPHEN  500 MG PO TABS Oral Take 500 mg by mouth every 6 (six) hours as needed.    Marland Kitchen LAXATIVE PO Oral Take by mouth daily.      Marland Kitchen CALCIUM PO Oral Take 1,000 mg by mouth daily.     Marland Kitchen VITAMIN D PO Oral Take 1,000 Units by mouth daily.      Marland Kitchen CLOBETASOL PROP EMOLLIENT BASE EX Apply externally Apply topically as needed.      Marland Kitchen CLONAZEPAM 0.5 MG PO TABS  at bedtime.     Marland Kitchen CLOTRIMAZOLE-BETAMETHASONE 1-0.05 % EX CREA Topical Apply topically 2 (two) times daily. 30 g 0  . DEXAMETHASONE 4 MG PO TABS Oral Take by mouth. Take 2 tablets twice a day the day before, day of, and day after taxotere.    Marland Kitchen DIAZEPAM 5 MG PO TABS Oral Take 1 tablet (5 mg total) by mouth every 8 (eight) hours as needed for anxiety or sleep. 30 tablet 3  . FIRST-DUKES MOUTHWASH MT SUSP Mouth/Throat Use as directed 5 mLs in the mouth or throat 4 (four) times daily. 300 mL 1  . FESOTERODINE FUMARATE ER 8 MG PO TB24 Oral Take 8 mg by mouth daily.    . FULVESTRANT 250 MG/5ML IM SOLN Intramuscular Inject 250 mg into the muscle every 30 (thirty) days. One injection each buttock over 1-2 minutes. Warm prior to use.    Marland Kitchen HYDROCODONE-ACETAMINOPHEN 5-500 MG PO TABS Oral Take 1 tablet by mouth every 4 (four) hours as needed for pain. 75 tablet 1  . LEVOTHYROXINE SODIUM 125 MCG PO TABS Oral Take 125 mcg by mouth daily.      Marland Kitchen LISINOPRIL 10 MG PO TABS Oral Take 10 mg by mouth daily.      Marland Kitchen LORAZEPAM 1 MG PO TABS Oral Take 1 tablet (1 mg total) by mouth every 4 (four) hours as needed (Nausea or vomiting). 30 tablet 2  . MEGESTROL ACETATE 40 MG PO TABS Oral Take 40 mg by mouth daily.    Marland Kitchen METFORMIN HCL PO Oral Take 500 mg by mouth daily.      Marland Kitchen MIRABEGRON ER 25 MG PO TB24 Oral Take 25 mg by mouth daily.    Marland Kitchen ONDANSETRON HCL 8 MG PO TABS  Take 1 tablet two times a day for 2 days starting the day after chemo, then take two times a day as needed for nausea or vomiting. 30 tablet 2  . ONETOUCH ULTRA BLUE VI STRP      . POTASSIUM CHLORIDE CRYS ER 20 MEQ PO TBCR  Oral Take 20 mEq by mouth daily.    Marland Kitchen PROCHLORPERAZINE 25 MG  RE SUPP Rectal Place 1 suppository (25 mg total) rectally every 6 (six) hours as needed for nausea. 12 suppository 2  . TRIAMTERENE-HCTZ 37.5-25 MG PO CAPS Oral Take 1 capsule by mouth 3 (three) times a week. Taking Mondays, Wednesdays and Fridays    . VENLAFAXINE HCL 75 MG PO TABS Oral Take 1 tablet (75 mg total) by mouth at bedtime. 30 tablet 2  . ZOMETA IV Intravenous Inject into the vein every 30 (thirty) days.        Triage Vitals: BP 137/62  Pulse 109  Temp 98.4 F (36.9 C) (Oral)  Resp 18  Ht 5\' 5"  (1.651 m)  Wt 214 lb (97.07 kg)  BMI 35.61 kg/m2  SpO2 96%  Physical Exam  Constitutional: She is oriented to person, place, and time. She appears well-developed and well-nourished.  HENT:  Head: Normocephalic and atraumatic.  Eyes: Conjunctivae and EOM are normal. No scleral icterus.  Neck: Neck supple. No thyromegaly present.  Cardiovascular: Normal rate and regular rhythm.  Exam reveals no gallop and no friction rub.   No murmur heard. Pulmonary/Chest: No stridor. She has no wheezes. She has no rales. She exhibits no tenderness.       Right anterior chest tenderness. And healed scar from right sided mastectomy.   Abdominal: Soft. She exhibits no distension. There is no tenderness. There is no rebound.  Musculoskeletal: Normal range of motion. She exhibits no edema.  Lymphadenopathy:    She has no cervical adenopathy.  Neurological: She is alert and oriented to person, place, and time. Coordination normal.  Skin: Skin is warm and dry. No rash noted. No erythema.       Healed scar from right sided mastectomy.    Psychiatric: She has a normal mood and affect. Her behavior is normal.    ED Course  Procedures (including critical care time) DIAGNOSTIC STUDIES: Oxygen Saturation is 96% on room air, adequate by my interpretation.    COORDINATION OF CARE: At 849 AM Discussed treatment plan with patient which includes  heart workup, blood work, D-dimer, and CXR . Patient agrees.    Labs Reviewed  CBC  COMPREHENSIVE METABOLIC PANEL   No results found.   No diagnosis found.   Date: 06/14/2012  Rate: 112  Rhythm: sinus tach  QRS Axis: normal  Intervals: normal  ST/T Wave abnormalities: nonspecific ST changes  Conduction Disutrbances:none  Narrative Interpretation:   Old EKG Reviewed: none available    MDM        Benny Lennert, MD 06/14/12 1209

## 2012-06-14 NOTE — ED Notes (Signed)
Patient woke with right side chest pain this morning that radiated to back. Patient states "I thought it was indigestion so I drank some gingerale and burped to see if I felt better but it actually made it worse." Patient being treated for breast cancer on right side.

## 2012-06-15 ENCOUNTER — Telehealth (HOSPITAL_COMMUNITY): Payer: Self-pay

## 2012-06-15 NOTE — Telephone Encounter (Signed)
Call from [place] - Awakened yesterday about 7 am with sharp stabbing chest pain and shortness of breath.  Went to ED, CT Angio and blood work was done because MD thought she might have PE.  Scan was negative and it took injections of  Morhpine and Dilaudid before she became comfortable.  She was discharged home with Hydrocodone.  Had to get up once during the night with chest discomfort but took pain pill and pain was relieved.  Just wanted to let Dr. Mariel Sleet know and wanted to know if there was anything else she needed to do.

## 2012-06-15 NOTE — Telephone Encounter (Signed)
Discussed temp of 102.2 with Dr. Mariel Sleet.  Kristen Parker denies any facial pain, yellow or green mucus and no cough.  Per instructions of MD to take tylenol as needed for fever and drink plenty of fluids.  To call clinic in the am with update.

## 2012-06-17 ENCOUNTER — Encounter (HOSPITAL_COMMUNITY): Payer: Medicare Other

## 2012-06-17 ENCOUNTER — Inpatient Hospital Stay (HOSPITAL_COMMUNITY): Payer: Medicare Other

## 2012-06-18 ENCOUNTER — Ambulatory Visit (HOSPITAL_COMMUNITY): Payer: Medicare Other

## 2012-06-25 ENCOUNTER — Encounter (HOSPITAL_BASED_OUTPATIENT_CLINIC_OR_DEPARTMENT_OTHER): Payer: Medicare Other

## 2012-06-25 ENCOUNTER — Ambulatory Visit (HOSPITAL_COMMUNITY)
Admission: RE | Admit: 2012-06-25 | Discharge: 2012-06-25 | Disposition: A | Discharge status: Home / Self Care | Payer: Medicare Other | Source: Ambulatory Visit | Attending: Oncology | Admitting: Oncology

## 2012-06-25 ENCOUNTER — Other Ambulatory Visit (HOSPITAL_COMMUNITY): Payer: Self-pay | Admitting: Oncology

## 2012-06-25 VITALS — BP 136/78 | HR 114 | Temp 99.1°F

## 2012-06-25 DIAGNOSIS — R509 Fever, unspecified: Secondary | ICD-10-CM

## 2012-06-25 DIAGNOSIS — C7951 Secondary malignant neoplasm of bone: Secondary | ICD-10-CM

## 2012-06-25 DIAGNOSIS — C50519 Malignant neoplasm of lower-outer quadrant of unspecified female breast: Secondary | ICD-10-CM

## 2012-06-25 DIAGNOSIS — N390 Urinary tract infection, site not specified: Secondary | ICD-10-CM

## 2012-06-25 DIAGNOSIS — C50919 Malignant neoplasm of unspecified site of unspecified female breast: Secondary | ICD-10-CM | POA: Insufficient documentation

## 2012-06-25 DIAGNOSIS — R05 Cough: Secondary | ICD-10-CM | POA: Insufficient documentation

## 2012-06-25 DIAGNOSIS — R059 Cough, unspecified: Secondary | ICD-10-CM | POA: Insufficient documentation

## 2012-06-25 DIAGNOSIS — Z452 Encounter for adjustment and management of vascular access device: Secondary | ICD-10-CM

## 2012-06-25 LAB — URINE MICROSCOPIC-ADD ON

## 2012-06-25 LAB — URINALYSIS, ROUTINE W REFLEX MICROSCOPIC
Glucose, UA: NEGATIVE mg/dL
Hgb urine dipstick: NEGATIVE
Protein, ur: NEGATIVE mg/dL
pH: 6 (ref 5.0–8.0)

## 2012-06-25 LAB — COMPREHENSIVE METABOLIC PANEL
ALT: 25 U/L (ref 0–35)
Calcium: 10.2 mg/dL (ref 8.4–10.5)
Creatinine, Ser: 0.84 mg/dL (ref 0.50–1.10)
GFR calc Af Amer: 86 mL/min — ABNORMAL LOW (ref >90)
GFR calc non Af Amer: 75 mL/min — ABNORMAL LOW (ref >90)
Glucose, Bld: 267 mg/dL — ABNORMAL HIGH (ref 70–99)
Sodium: 130 mEq/L — ABNORMAL LOW (ref 135–145)
Total Protein: 7.2 g/dL (ref 6.0–8.3)

## 2012-06-25 LAB — CBC WITH DIFFERENTIAL/PLATELET
Eosinophils Absolute: 0 10*3/uL (ref 0.0–0.7)
Eosinophils Relative: 0 % (ref 0–5)
HCT: 28.9 % — ABNORMAL LOW (ref 36.0–46.0)
Lymphs Abs: 1.6 10*3/uL (ref 0.7–4.0)
MCH: 31 pg (ref 26.0–34.0)
MCV: 95.4 fL (ref 78.0–100.0)
Monocytes Absolute: 1.1 10*3/uL — ABNORMAL HIGH (ref 0.1–1.0)
Platelets: 587 10*3/uL — ABNORMAL HIGH (ref 150–400)
RBC: 3.03 MIL/uL — ABNORMAL LOW (ref 3.87–5.11)
RDW: 16 % — ABNORMAL HIGH (ref 11.5–15.5)

## 2012-06-25 MED ORDER — HEPARIN SOD (PORK) LOCK FLUSH 100 UNIT/ML IV SOLN
INTRAVENOUS | Status: AC
  Filled 2012-06-25: qty 5

## 2012-06-25 MED ORDER — HEPARIN SOD (PORK) LOCK FLUSH 100 UNIT/ML IV SOLN
500.0000 [IU] | Freq: Once | INTRAVENOUS | Status: AC | PRN
  Administered 2012-06-25: 500 [IU]
  Filled 2012-06-25: qty 5

## 2012-06-25 MED ORDER — SODIUM CHLORIDE 0.9 % IJ SOLN
10.0000 mL | INTRAMUSCULAR | Status: DC | PRN
  Filled 2012-06-25: qty 10

## 2012-06-25 MED ORDER — SODIUM CHLORIDE 0.9 % IV SOLN
Freq: Once | INTRAVENOUS | Status: AC
  Administered 2012-06-25: 500 mL via INTRAVENOUS

## 2012-06-25 MED ORDER — SULFAMETHOXAZOLE-TRIMETHOPRIM 800-160 MG PO TABS: 1.0000 | ORAL_TABLET | Freq: Two times a day (BID) | ORAL | Status: AC

## 2012-06-25 NOTE — Progress Notes (Signed)
Kristen Parker reports generalized fatigue and persistent low grade fevers x approx. 2 weeks - today she reports general malaise persists and ShOB w/ activity.  Patient denies abdominal pain, denies n/v/d, reports urinary frequency, reports mild non-productive cough.  [name], PA-C advised of pt's symptoms today and of today's lab work - per [name], PA-C, patient's chemo is being held x 1 more week and cxr, u/a performed today.  Kristen Parker verbalizes understanding for plan of care.

## 2012-06-25 NOTE — Progress Notes (Signed)
Patient not feeling well.  Low grade fever of over 99 F.  Leukocytosis appreciated.  Will perform UA and Chest X-ray.  Defer treatment by 1 week.  Denies any sputum production.  May be viral infection (this seems to be going around). No antibiotics at this time, unless chest X-ray reveals an indication.   Shar Paez

## 2012-06-25 NOTE — Addendum Note (Signed)
Addended by: Oda Kilts on: 06/25/2012 01:07 PM   Modules accepted: Orders

## 2012-06-26 ENCOUNTER — Ambulatory Visit (HOSPITAL_COMMUNITY): Payer: Medicare Other

## 2012-06-26 ENCOUNTER — Encounter (HOSPITAL_BASED_OUTPATIENT_CLINIC_OR_DEPARTMENT_OTHER): Payer: Medicare Other | Admitting: Oncology

## 2012-06-26 VITALS — BP 127/71 | HR 107 | Temp 98.9°F | Ht 65.0 in | Wt 218.0 lb

## 2012-06-26 DIAGNOSIS — C7951 Secondary malignant neoplasm of bone: Secondary | ICD-10-CM

## 2012-06-26 DIAGNOSIS — C50519 Malignant neoplasm of lower-outer quadrant of unspecified female breast: Secondary | ICD-10-CM

## 2012-06-26 DIAGNOSIS — C773 Secondary and unspecified malignant neoplasm of axilla and upper limb lymph nodes: Secondary | ICD-10-CM

## 2012-06-26 DIAGNOSIS — C50919 Malignant neoplasm of unspecified site of unspecified female breast: Secondary | ICD-10-CM

## 2012-06-26 DIAGNOSIS — E119 Type 2 diabetes mellitus without complications: Secondary | ICD-10-CM

## 2012-06-26 NOTE — Progress Notes (Signed)
Problem #1 metastatic breast cancer lobular type to multiple bones and the lymph nodes of the right axilla status post mastectomy with axillary dissection revealing 26 of 29 positive lymph nodes at presentation in September 2009. She was treated with letrozole with a great response initially then tamoxifen with a mild response followed by Faslodex with no real improvement. She is now status post one chemotherapy treatment with docetaxel 75 mg per meter squared and her cancer marker has dropped very very nicely now.  Problem #2 recent virus infection with severe chest pain with workup in the emergency room showing no evidence for pneumonia or pulmonary embolus. We suspect the virus cause severe muscle and chest wall pain.  Problem #3 diabetes mellitus with worsening during this therapy because of the dexamethasone premedication. During her 3 days of dexamethasone she will increase her metformin going forward to 2 twice a day. She may need one twice a day for several other days but that will be determined based upon her sugars.  She looks great today and feels so much better. All the viral symptomatology has passed.. We did defer her chemotherapy this week due to the viral illness. Therefore we will resume therapy next week especially since her cancer marker has dropped from 139 to 100 after one cycle of chemotherapy. We are all delighted with this response and hopefully will continue. We will do her PET scan after 3 cycles of therapy for confirmation of response. She asked a question as to how many cycles of chemotherapy we could potentially give her and I think it depends on how she tolerates things but I believe the number of cycles is between 6 and 10 cycles.

## 2012-06-26 NOTE — Patient Instructions (Addendum)
Kristen Parker  [id number] 20-Aug-1951 Dr. Glenford Peers [facility]  Discharge Instructions  RECOMMENDATIONS MADE BY THE CONSULTANT AND ANY TEST RESULTS WILL BE SENT TO YOUR REFERRING DOCTOR.   EXAM FINDINGS BY MD TODAY AND SIGNS AND SYMPTOMS TO REPORT TO CLINIC OR PRIMARY MD: You look good today.  Will resume chemotherapy next week.  Increase your metformin to 2 pills twice daily on the days that you take decadron.  Check your blood sugar a couple of times in the afternoon to see what your levels are.  MEDICATIONS PRESCRIBED: none   INSTRUCTIONS GIVEN AND DISCUSSED: Other : Report fevers, chills, uncontrolled nausea, vomiting, pain, etc.  SPECIAL INSTRUCTIONS/FOLLOW-UP: Xray Studies Needed after cycle 3 of chemotherapy and Return to Clinic after scans to see MD.   I acknowledge that I have been informed and understand all the instructions given to me and received a copy. I do not have any more questions at this time, but understand that I may call the Specialty Clinic at [facility] at [phone] during business hours should I have any further questions or need assistance in obtaining follow-up care.    __________________________________________  _____________  __________ Signature of Patient or Authorized Representative            Date                   Time    __________________________________________ Nurse's Signature

## 2012-06-27 LAB — URINE CULTURE

## 2012-06-30 ENCOUNTER — Other Ambulatory Visit (HOSPITAL_COMMUNITY): Payer: Self-pay | Admitting: Oncology

## 2012-06-30 DIAGNOSIS — C50919 Malignant neoplasm of unspecified site of unspecified female breast: Secondary | ICD-10-CM

## 2012-06-30 MED ORDER — VENLAFAXINE HCL 75 MG PO TABS: 75.0000 mg | ORAL_TABLET | Freq: Every day | ORAL | Status: DC

## 2012-07-01 ENCOUNTER — Encounter (HOSPITAL_BASED_OUTPATIENT_CLINIC_OR_DEPARTMENT_OTHER): Payer: Medicare Other

## 2012-07-01 ENCOUNTER — Other Ambulatory Visit (HOSPITAL_COMMUNITY): Payer: Self-pay | Admitting: Oncology

## 2012-07-01 VITALS — BP 132/78 | HR 123 | Temp 98.5°F | Ht 65.0 in | Wt 218.0 lb

## 2012-07-01 DIAGNOSIS — Z5111 Encounter for antineoplastic chemotherapy: Secondary | ICD-10-CM

## 2012-07-01 DIAGNOSIS — C7951 Secondary malignant neoplasm of bone: Secondary | ICD-10-CM

## 2012-07-01 DIAGNOSIS — C50919 Malignant neoplasm of unspecified site of unspecified female breast: Secondary | ICD-10-CM

## 2012-07-01 DIAGNOSIS — C50519 Malignant neoplasm of lower-outer quadrant of unspecified female breast: Secondary | ICD-10-CM

## 2012-07-01 DIAGNOSIS — C7952 Secondary malignant neoplasm of bone marrow: Secondary | ICD-10-CM

## 2012-07-01 DIAGNOSIS — C773 Secondary and unspecified malignant neoplasm of axilla and upper limb lymph nodes: Secondary | ICD-10-CM

## 2012-07-01 LAB — CBC
HCT: 28.4 % — ABNORMAL LOW (ref 36.0–46.0)
Hemoglobin: 9.1 g/dL — ABNORMAL LOW (ref 12.0–15.0)
MCH: 30.5 pg (ref 26.0–34.0)
MCHC: 32 g/dL (ref 30.0–36.0)
MCV: 95.3 fL (ref 78.0–100.0)

## 2012-07-01 LAB — COMPREHENSIVE METABOLIC PANEL
Albumin: 3 g/dL — ABNORMAL LOW (ref 3.5–5.2)
BUN: 28 mg/dL — ABNORMAL HIGH (ref 6–23)
Calcium: 9.4 mg/dL (ref 8.4–10.5)
Creatinine, Ser: 1.05 mg/dL (ref 0.50–1.10)
GFR calc Af Amer: 66 mL/min — ABNORMAL LOW (ref >90)
Glucose, Bld: 244 mg/dL — ABNORMAL HIGH (ref 70–99)
Potassium: 4.5 mEq/L (ref 3.5–5.1)
Total Protein: 7 g/dL (ref 6.0–8.3)

## 2012-07-01 LAB — DIFFERENTIAL
Basophils Relative: 0 % (ref 0–1)
Eosinophils Absolute: 0 10*3/uL (ref 0.0–0.7)
Eosinophils Relative: 0 % (ref 0–5)
Monocytes Absolute: 0.5 10*3/uL (ref 0.1–1.0)
Monocytes Relative: 3 % (ref 3–12)

## 2012-07-01 MED ORDER — HEPARIN SOD (PORK) LOCK FLUSH 100 UNIT/ML IV SOLN
500.0000 [IU] | Freq: Once | INTRAVENOUS | Status: AC | PRN
  Administered 2012-07-01: 500 [IU]
  Filled 2012-07-01: qty 5

## 2012-07-01 MED ORDER — HEPARIN SOD (PORK) LOCK FLUSH 100 UNIT/ML IV SOLN
INTRAVENOUS | Status: AC
  Filled 2012-07-01: qty 5

## 2012-07-01 MED ORDER — DOCETAXEL CHEMO INJECTION 160 MG/16ML
75.0000 mg/m2 | Freq: Once | INTRAVENOUS | Status: AC
  Administered 2012-07-01: 160 mg via INTRAVENOUS
  Filled 2012-07-01: qty 16

## 2012-07-01 MED ORDER — DEXAMETHASONE SODIUM PHOSPHATE 10 MG/ML IJ SOLN: 10.0000 mg | Freq: Once | INTRAMUSCULAR | Status: DC

## 2012-07-01 MED ORDER — SODIUM CHLORIDE 0.9 % IV SOLN
Freq: Once | INTRAVENOUS | Status: AC
  Administered 2012-07-01: 8 mg via INTRAVENOUS
  Filled 2012-07-01: qty 4

## 2012-07-01 MED ORDER — SODIUM CHLORIDE 0.9 % IJ SOLN
10.0000 mL | INTRAMUSCULAR | Status: DC | PRN
  Administered 2012-07-01: 10 mL
  Filled 2012-07-01: qty 10

## 2012-07-01 MED ORDER — SODIUM CHLORIDE 0.9 % IV SOLN
Freq: Once | INTRAVENOUS | Status: AC
  Administered 2012-07-01: 10:00:00 via INTRAVENOUS

## 2012-07-01 MED ORDER — SODIUM CHLORIDE 0.9 % IV SOLN: 8.0000 mg | Freq: Once | INTRAVENOUS | Status: DC

## 2012-07-01 NOTE — Progress Notes (Signed)
Tolerated well

## 2012-07-02 ENCOUNTER — Encounter (HOSPITAL_COMMUNITY): Payer: Medicare Other | Attending: Oncology

## 2012-07-02 DIAGNOSIS — C50519 Malignant neoplasm of lower-outer quadrant of unspecified female breast: Secondary | ICD-10-CM

## 2012-07-02 DIAGNOSIS — C50919 Malignant neoplasm of unspecified site of unspecified female breast: Secondary | ICD-10-CM | POA: Insufficient documentation

## 2012-07-02 DIAGNOSIS — C773 Secondary and unspecified malignant neoplasm of axilla and upper limb lymph nodes: Secondary | ICD-10-CM

## 2012-07-02 DIAGNOSIS — C7952 Secondary malignant neoplasm of bone marrow: Secondary | ICD-10-CM | POA: Insufficient documentation

## 2012-07-02 DIAGNOSIS — C7951 Secondary malignant neoplasm of bone: Secondary | ICD-10-CM | POA: Insufficient documentation

## 2012-07-02 DIAGNOSIS — C801 Malignant (primary) neoplasm, unspecified: Secondary | ICD-10-CM | POA: Insufficient documentation

## 2012-07-02 DIAGNOSIS — N390 Urinary tract infection, site not specified: Secondary | ICD-10-CM | POA: Insufficient documentation

## 2012-07-02 MED ORDER — PEGFILGRASTIM INJECTION 6 MG/0.6ML
6.0000 mg | Freq: Once | SUBCUTANEOUS | Status: AC
  Administered 2012-07-02: 6 mg via SUBCUTANEOUS

## 2012-07-02 MED ORDER — PEGFILGRASTIM INJECTION 6 MG/0.6ML
SUBCUTANEOUS | Status: AC
  Filled 2012-07-02: qty 0.6

## 2012-07-02 NOTE — Progress Notes (Signed)
Kristen Parker presents today for injection per the provider's orders.  Neulatsta administered administration without incident; see MAR for injection details.  Patient tolerated procedure well and without incident.  No questions or complaints noted at this time.

## 2012-07-03 ENCOUNTER — Ambulatory Visit (HOSPITAL_COMMUNITY): Payer: Medicare Other

## 2012-07-08 ENCOUNTER — Inpatient Hospital Stay (HOSPITAL_COMMUNITY): Payer: Medicare Other

## 2012-07-08 ENCOUNTER — Encounter (HOSPITAL_BASED_OUTPATIENT_CLINIC_OR_DEPARTMENT_OTHER): Payer: Medicare Other

## 2012-07-08 VITALS — BP 135/76 | HR 115 | Temp 98.7°F | Resp 18

## 2012-07-08 DIAGNOSIS — C50519 Malignant neoplasm of lower-outer quadrant of unspecified female breast: Secondary | ICD-10-CM

## 2012-07-08 DIAGNOSIS — C7951 Secondary malignant neoplasm of bone: Secondary | ICD-10-CM

## 2012-07-08 DIAGNOSIS — C773 Secondary and unspecified malignant neoplasm of axilla and upper limb lymph nodes: Secondary | ICD-10-CM

## 2012-07-08 DIAGNOSIS — C50919 Malignant neoplasm of unspecified site of unspecified female breast: Secondary | ICD-10-CM

## 2012-07-08 DIAGNOSIS — N390 Urinary tract infection, site not specified: Secondary | ICD-10-CM

## 2012-07-08 LAB — URINALYSIS, ROUTINE W REFLEX MICROSCOPIC
Leukocytes, UA: NEGATIVE
Urobilinogen, UA: 0.2 mg/dL (ref 0.0–1.0)

## 2012-07-08 LAB — URINE MICROSCOPIC-ADD ON

## 2012-07-08 MED ORDER — SODIUM CHLORIDE 0.9 % IJ SOLN
INTRAMUSCULAR | Status: AC
  Filled 2012-07-08: qty 10

## 2012-07-08 MED ORDER — SODIUM CHLORIDE 0.9 % IV SOLN
Freq: Once | INTRAVENOUS | Status: AC
  Administered 2012-07-08: 12:00:00 via INTRAVENOUS

## 2012-07-08 MED ORDER — HEPARIN SOD (PORK) LOCK FLUSH 100 UNIT/ML IV SOLN
500.0000 [IU] | Freq: Once | INTRAVENOUS | Status: AC | PRN
  Administered 2012-07-08: 500 [IU]
  Filled 2012-07-08: qty 5

## 2012-07-08 MED ORDER — HEPARIN SOD (PORK) LOCK FLUSH 100 UNIT/ML IV SOLN
INTRAVENOUS | Status: AC
  Filled 2012-07-08: qty 5

## 2012-07-08 MED ORDER — ZOLEDRONIC ACID 4 MG/5ML IV CONC
4.0000 mg | Freq: Once | INTRAVENOUS | Status: AC
  Administered 2012-07-08: 4 mg via INTRAVENOUS
  Filled 2012-07-08: qty 5

## 2012-07-08 NOTE — Progress Notes (Signed)
Tolerated zometa infusion well. 

## 2012-07-09 ENCOUNTER — Ambulatory Visit (HOSPITAL_COMMUNITY): Payer: Medicare Other

## 2012-07-15 ENCOUNTER — Inpatient Hospital Stay (HOSPITAL_COMMUNITY): Payer: Medicare Other

## 2012-07-16 ENCOUNTER — Ambulatory Visit (HOSPITAL_COMMUNITY): Payer: Medicare Other

## 2012-07-22 ENCOUNTER — Other Ambulatory Visit (HOSPITAL_COMMUNITY): Payer: Self-pay | Admitting: Oncology

## 2012-07-22 ENCOUNTER — Encounter (HOSPITAL_BASED_OUTPATIENT_CLINIC_OR_DEPARTMENT_OTHER): Payer: Medicare Other

## 2012-07-22 VITALS — BP 145/71 | HR 122 | Temp 98.3°F | Resp 18 | Wt 217.4 lb

## 2012-07-22 DIAGNOSIS — C7951 Secondary malignant neoplasm of bone: Secondary | ICD-10-CM

## 2012-07-22 DIAGNOSIS — C50919 Malignant neoplasm of unspecified site of unspecified female breast: Secondary | ICD-10-CM

## 2012-07-22 DIAGNOSIS — C50519 Malignant neoplasm of lower-outer quadrant of unspecified female breast: Secondary | ICD-10-CM

## 2012-07-22 DIAGNOSIS — C773 Secondary and unspecified malignant neoplasm of axilla and upper limb lymph nodes: Secondary | ICD-10-CM

## 2012-07-22 DIAGNOSIS — Z5111 Encounter for antineoplastic chemotherapy: Secondary | ICD-10-CM

## 2012-07-22 LAB — CBC WITH DIFFERENTIAL/PLATELET
Basophils Absolute: 0 10*3/uL (ref 0.0–0.1)
Basophils Relative: 0 % (ref 0–1)
MCHC: 33.4 g/dL (ref 30.0–36.0)
Monocytes Absolute: 0.7 10*3/uL (ref 0.1–1.0)
Neutro Abs: 15.1 10*3/uL — ABNORMAL HIGH (ref 1.7–7.7)
Neutrophils Relative %: 89 % — ABNORMAL HIGH (ref 43–77)
Platelets: 304 10*3/uL (ref 150–400)
RDW: 19.1 % — ABNORMAL HIGH (ref 11.5–15.5)
WBC: 17 10*3/uL — ABNORMAL HIGH (ref 4.0–10.5)

## 2012-07-22 LAB — COMPREHENSIVE METABOLIC PANEL
ALT: 11 U/L (ref 0–35)
Alkaline Phosphatase: 132 U/L — ABNORMAL HIGH (ref 39–117)
BUN: 22 mg/dL (ref 6–23)
CO2: 16 mEq/L — ABNORMAL LOW (ref 19–32)
GFR calc Af Amer: 83 mL/min — ABNORMAL LOW (ref >90)
GFR calc non Af Amer: 72 mL/min — ABNORMAL LOW (ref >90)
Glucose, Bld: 221 mg/dL — ABNORMAL HIGH (ref 70–99)
Potassium: 4.1 mEq/L (ref 3.5–5.1)
Sodium: 135 mEq/L (ref 135–145)
Total Bilirubin: 0.3 mg/dL (ref 0.3–1.2)

## 2012-07-22 MED ORDER — DEXAMETHASONE SODIUM PHOSPHATE 10 MG/ML IJ SOLN: 10.0000 mg | Freq: Once | INTRAMUSCULAR | Status: DC

## 2012-07-22 MED ORDER — DOCETAXEL CHEMO INJECTION 160 MG/16ML
75.0000 mg/m2 | Freq: Once | INTRAVENOUS | Status: AC
  Administered 2012-07-22: 160 mg via INTRAVENOUS
  Filled 2012-07-22: qty 16

## 2012-07-22 MED ORDER — SODIUM CHLORIDE 0.9 % IV SOLN: 8.0000 mg | Freq: Once | INTRAVENOUS | Status: DC

## 2012-07-22 MED ORDER — HEPARIN SOD (PORK) LOCK FLUSH 100 UNIT/ML IV SOLN
500.0000 [IU] | Freq: Once | INTRAVENOUS | Status: AC | PRN
  Administered 2012-07-22: 500 [IU]
  Filled 2012-07-22: qty 5

## 2012-07-22 MED ORDER — SODIUM CHLORIDE 0.9 % IJ SOLN
10.0000 mL | INTRAMUSCULAR | Status: DC | PRN
  Administered 2012-07-22: 10 mL
  Filled 2012-07-22: qty 10

## 2012-07-22 MED ORDER — HEPARIN SOD (PORK) LOCK FLUSH 100 UNIT/ML IV SOLN
INTRAVENOUS | Status: AC
  Filled 2012-07-22: qty 5

## 2012-07-22 MED ORDER — SODIUM CHLORIDE 0.9 % IV SOLN
Freq: Once | INTRAVENOUS | Status: AC
  Administered 2012-07-22: 8 mg via INTRAVENOUS
  Filled 2012-07-22: qty 4

## 2012-07-22 MED ORDER — SODIUM CHLORIDE 0.9 % IV SOLN
Freq: Once | INTRAVENOUS | Status: AC
  Administered 2012-07-22: 10:00:00 via INTRAVENOUS

## 2012-07-22 MED ORDER — SODIUM CHLORIDE 0.9 % IJ SOLN
INTRAMUSCULAR | Status: AC
  Filled 2012-07-22: qty 10

## 2012-07-22 NOTE — Progress Notes (Signed)
Tolerated chemo well. 

## 2012-07-23 ENCOUNTER — Encounter (HOSPITAL_BASED_OUTPATIENT_CLINIC_OR_DEPARTMENT_OTHER): Payer: Medicare Other

## 2012-07-23 ENCOUNTER — Other Ambulatory Visit (HOSPITAL_COMMUNITY): Payer: Medicare Other

## 2012-07-23 VITALS — BP 146/91 | HR 132 | Temp 98.4°F | Resp 20

## 2012-07-23 DIAGNOSIS — C50519 Malignant neoplasm of lower-outer quadrant of unspecified female breast: Secondary | ICD-10-CM

## 2012-07-23 DIAGNOSIS — C7952 Secondary malignant neoplasm of bone marrow: Secondary | ICD-10-CM

## 2012-07-23 DIAGNOSIS — C773 Secondary and unspecified malignant neoplasm of axilla and upper limb lymph nodes: Secondary | ICD-10-CM

## 2012-07-23 DIAGNOSIS — C50919 Malignant neoplasm of unspecified site of unspecified female breast: Secondary | ICD-10-CM

## 2012-07-23 MED ORDER — PEGFILGRASTIM INJECTION 6 MG/0.6ML
SUBCUTANEOUS | Status: AC
  Filled 2012-07-23: qty 0.6

## 2012-07-23 MED ORDER — PEGFILGRASTIM INJECTION 6 MG/0.6ML
6.0000 mg | Freq: Once | SUBCUTANEOUS | Status: AC
  Administered 2012-07-23: 6 mg via SUBCUTANEOUS

## 2012-07-23 NOTE — Progress Notes (Signed)
Kristen Parker presents today for injection per MD orders. Neulasta 6mg administered SQ in right Abdomen. Administration without incident. Patient tolerated well.  

## 2012-07-29 ENCOUNTER — Inpatient Hospital Stay (HOSPITAL_COMMUNITY): Payer: Medicare Other

## 2012-07-30 ENCOUNTER — Ambulatory Visit (HOSPITAL_COMMUNITY): Payer: Medicare Other

## 2012-07-30 ENCOUNTER — Encounter (HOSPITAL_COMMUNITY)
Admission: RE | Admit: 2012-07-30 | Discharge: 2012-07-30 | Disposition: A | Discharge status: Home / Self Care | Payer: Medicare Other | Source: Ambulatory Visit | Attending: Oncology | Admitting: Oncology

## 2012-07-30 DIAGNOSIS — Z9089 Acquired absence of other organs: Secondary | ICD-10-CM | POA: Insufficient documentation

## 2012-07-30 DIAGNOSIS — C7951 Secondary malignant neoplasm of bone: Secondary | ICD-10-CM | POA: Insufficient documentation

## 2012-07-30 DIAGNOSIS — Z9010 Acquired absence of unspecified breast and nipple: Secondary | ICD-10-CM | POA: Insufficient documentation

## 2012-07-30 DIAGNOSIS — C50919 Malignant neoplasm of unspecified site of unspecified female breast: Secondary | ICD-10-CM | POA: Insufficient documentation

## 2012-07-30 DIAGNOSIS — Q762 Congenital spondylolisthesis: Secondary | ICD-10-CM | POA: Insufficient documentation

## 2012-07-30 LAB — GLUCOSE, CAPILLARY: Glucose-Capillary: 114 mg/dL — ABNORMAL HIGH (ref 70–99)

## 2012-07-30 MED ORDER — FLUDEOXYGLUCOSE F - 18 (FDG) INJECTION
17.2000 | Freq: Once | INTRAVENOUS | Status: AC | PRN
  Administered 2012-07-30: 17.2 via INTRAVENOUS

## 2012-07-31 ENCOUNTER — Encounter (HOSPITAL_BASED_OUTPATIENT_CLINIC_OR_DEPARTMENT_OTHER): Payer: Medicare Other | Admitting: Oncology

## 2012-07-31 VITALS — BP 138/79 | HR 112 | Temp 98.3°F | Resp 20 | Wt 220.0 lb

## 2012-07-31 DIAGNOSIS — C50919 Malignant neoplasm of unspecified site of unspecified female breast: Secondary | ICD-10-CM

## 2012-07-31 DIAGNOSIS — C50519 Malignant neoplasm of lower-outer quadrant of unspecified female breast: Secondary | ICD-10-CM

## 2012-07-31 DIAGNOSIS — C773 Secondary and unspecified malignant neoplasm of axilla and upper limb lymph nodes: Secondary | ICD-10-CM

## 2012-07-31 DIAGNOSIS — C7951 Secondary malignant neoplasm of bone: Secondary | ICD-10-CM

## 2012-07-31 DIAGNOSIS — C7952 Secondary malignant neoplasm of bone marrow: Secondary | ICD-10-CM

## 2012-07-31 MED ORDER — HYDROCODONE-ACETAMINOPHEN 5-500 MG PO TABS: 1.0000 | ORAL_TABLET | ORAL | Status: DC | PRN

## 2012-07-31 NOTE — Progress Notes (Signed)
Problem #1 metastatic breast cancer, lobular type with multiple bones and lymph nodes involved in the right axilla status post mastectomy with axillary dissection revealing 26 of 29 positive lymph nodes in September 2009. She was treated initially with letrozole with a great response, then tamoxifen with a mild response, followed by Faslodex with no real improvement. She is now on docetaxel every 3 weeks. She had her PET scan the other day weeks hopefully indicates improvement in her bone metastases. It was felt by the radiologist that the mild changes in these bone metastases reflected bone healing rather than bone worsening.  She really does look better in my opinion since starting chemotherapy. Her cancer marker however went down and then came back to so we will have to see what that means is we go forward with 3 more cycles of therapy and then a repeat PET scan.  She is having bone pain from the Neulasta but it is tolerable and only last 2 days. Otherwise she has no positive review of systems findings that are new or different.  Will see her back in about 6 weeks to make sure she is doing well we will then do the PET scan after 3 cycles which will be November

## 2012-07-31 NOTE — Patient Instructions (Addendum)
Kristen Parker  DOB November 24, 1951 CSN [id number]  MRN [id number] Dr. Glenford Peers   [facility]  Discharge Instructions  RECOMMENDATIONS MADE BY THE CONSULTANT AND ANY TEST RESULTS WILL BE SENT TO YOUR REFERRING DOCTOR.   EXAM FINDINGS BY MD TODAY AND SIGNS AND SYMPTOMS TO REPORT TO CLINIC OR PRIMARY MD: Your PET scan was good even though your tumor marker has gone up some.  MEDICATIONS PRESCRIBED: refill for Hydrocodone   INSTRUCTIONS GIVEN AND DISCUSSED: Other :  Report any new lumps, bone pain or shortness of breath.  SPECIAL INSTRUCTIONS/FOLLOW-UP: Xray Studies Needed PET scan in November and Return to Clinic as scheduled for chemotherapy an in 6 weeks for follow-up.   I acknowledge that I have been informed and understand all the instructions given to me and received a copy. I do not have any more questions at this time, but understand that I may call the Specialty Clinic at [facility] at [phone] during business hours should I have any further questions or need assistance in obtaining follow-up care.    __________________________________________  _____________  __________ Signature of Patient or Authorized Representative            Date                   Time    __________________________________________ Nurse's Signature

## 2012-08-04 ENCOUNTER — Other Ambulatory Visit (HOSPITAL_COMMUNITY): Payer: Self-pay | Admitting: Oncology

## 2012-08-04 DIAGNOSIS — C7951 Secondary malignant neoplasm of bone: Secondary | ICD-10-CM

## 2012-08-04 MED ORDER — TRIAMTERENE-HCTZ 37.5-25 MG PO CAPS: 1.0000 | ORAL_CAPSULE | ORAL | Status: DC

## 2012-08-05 ENCOUNTER — Encounter (HOSPITAL_COMMUNITY): Payer: Medicare Other

## 2012-08-05 ENCOUNTER — Inpatient Hospital Stay (HOSPITAL_COMMUNITY): Payer: Medicare Other

## 2012-08-06 ENCOUNTER — Ambulatory Visit (HOSPITAL_COMMUNITY): Payer: Medicare Other

## 2012-08-11 ENCOUNTER — Other Ambulatory Visit (HOSPITAL_COMMUNITY): Payer: Self-pay | Admitting: Oncology

## 2012-08-11 DIAGNOSIS — C50919 Malignant neoplasm of unspecified site of unspecified female breast: Secondary | ICD-10-CM

## 2012-08-11 MED ORDER — MEGESTROL ACETATE 40 MG PO TABS: 40.0000 mg | ORAL_TABLET | Freq: Every day | ORAL | Status: DC

## 2012-08-12 ENCOUNTER — Encounter (HOSPITAL_COMMUNITY): Payer: Medicare Other

## 2012-08-12 ENCOUNTER — Encounter (HOSPITAL_COMMUNITY): Payer: Medicare Other | Attending: Oncology

## 2012-08-12 DIAGNOSIS — C50519 Malignant neoplasm of lower-outer quadrant of unspecified female breast: Secondary | ICD-10-CM

## 2012-08-12 DIAGNOSIS — C801 Malignant (primary) neoplasm, unspecified: Secondary | ICD-10-CM | POA: Insufficient documentation

## 2012-08-12 DIAGNOSIS — C7951 Secondary malignant neoplasm of bone: Secondary | ICD-10-CM

## 2012-08-12 DIAGNOSIS — C7952 Secondary malignant neoplasm of bone marrow: Secondary | ICD-10-CM

## 2012-08-12 DIAGNOSIS — C50919 Malignant neoplasm of unspecified site of unspecified female breast: Secondary | ICD-10-CM

## 2012-08-12 DIAGNOSIS — Z5111 Encounter for antineoplastic chemotherapy: Secondary | ICD-10-CM

## 2012-08-12 DIAGNOSIS — C773 Secondary and unspecified malignant neoplasm of axilla and upper limb lymph nodes: Secondary | ICD-10-CM

## 2012-08-12 LAB — COMPREHENSIVE METABOLIC PANEL
Alkaline Phosphatase: 106 U/L (ref 39–117)
BUN: 24 mg/dL — ABNORMAL HIGH (ref 6–23)
Creatinine, Ser: 0.83 mg/dL (ref 0.50–1.10)
GFR calc Af Amer: 88 mL/min — ABNORMAL LOW (ref >90)
Glucose, Bld: 222 mg/dL — ABNORMAL HIGH (ref 70–99)
Potassium: 4.2 mEq/L (ref 3.5–5.1)
Total Bilirubin: 0.3 mg/dL (ref 0.3–1.2)
Total Protein: 6.5 g/dL (ref 6.0–8.3)

## 2012-08-12 LAB — CBC WITH DIFFERENTIAL/PLATELET
Basophils Absolute: 0 10*3/uL (ref 0.0–0.1)
Eosinophils Absolute: 0 10*3/uL (ref 0.0–0.7)
Eosinophils Relative: 0 % (ref 0–5)
MCH: 32.9 pg (ref 26.0–34.0)
MCV: 100 fL (ref 78.0–100.0)
Neutrophils Relative %: 91 % — ABNORMAL HIGH (ref 43–77)
Platelets: 291 10*3/uL (ref 150–400)
RDW: 20 % — ABNORMAL HIGH (ref 11.5–15.5)
WBC: 16.4 10*3/uL — ABNORMAL HIGH (ref 4.0–10.5)

## 2012-08-12 MED ORDER — HEPARIN SOD (PORK) LOCK FLUSH 100 UNIT/ML IV SOLN
INTRAVENOUS | Status: AC
  Filled 2012-08-12: qty 5

## 2012-08-12 MED ORDER — SODIUM CHLORIDE 0.9 % IV SOLN
4.0000 mg | Freq: Once | INTRAVENOUS | Status: AC
  Administered 2012-08-12: 4 mg via INTRAVENOUS
  Filled 2012-08-12: qty 5

## 2012-08-12 MED ORDER — HEPARIN SOD (PORK) LOCK FLUSH 100 UNIT/ML IV SOLN
500.0000 [IU] | Freq: Once | INTRAVENOUS | Status: DC | PRN
  Filled 2012-08-12: qty 5

## 2012-08-12 MED ORDER — SODIUM CHLORIDE 0.9 % IV SOLN
Freq: Once | INTRAVENOUS | Status: AC
  Administered 2012-08-12: 11:00:00 via INTRAVENOUS

## 2012-08-12 MED ORDER — DOCETAXEL CHEMO INJECTION 160 MG/16ML
75.0000 mg/m2 | Freq: Once | INTRAVENOUS | Status: AC
  Administered 2012-08-12: 160 mg via INTRAVENOUS
  Filled 2012-08-12: qty 16

## 2012-08-12 MED ORDER — DEXAMETHASONE SODIUM PHOSPHATE 10 MG/ML IJ SOLN: 10.0000 mg | Freq: Once | INTRAMUSCULAR | Status: DC

## 2012-08-12 MED ORDER — SODIUM CHLORIDE 0.9 % IV SOLN: 8.0000 mg | Freq: Once | INTRAVENOUS | Status: DC

## 2012-08-12 MED ORDER — SODIUM CHLORIDE 0.9 % IV SOLN
Freq: Once | INTRAVENOUS | Status: AC
  Administered 2012-08-12: 8 mg via INTRAVENOUS
  Filled 2012-08-12: qty 4

## 2012-08-12 MED ORDER — SODIUM CHLORIDE 0.9 % IJ SOLN
10.0000 mL | INTRAMUSCULAR | Status: DC | PRN
  Administered 2012-08-12: 10 mL
  Filled 2012-08-12: qty 10

## 2012-08-12 NOTE — Progress Notes (Signed)
Pt tolerated chemo and zometa without problems.

## 2012-08-12 NOTE — Progress Notes (Signed)
Tolerated well

## 2012-08-13 ENCOUNTER — Encounter (HOSPITAL_BASED_OUTPATIENT_CLINIC_OR_DEPARTMENT_OTHER): Payer: Medicare Other

## 2012-08-13 VITALS — BP 121/78 | HR 116 | Temp 99.2°F | Resp 18

## 2012-08-13 DIAGNOSIS — C773 Secondary and unspecified malignant neoplasm of axilla and upper limb lymph nodes: Secondary | ICD-10-CM

## 2012-08-13 DIAGNOSIS — C7951 Secondary malignant neoplasm of bone: Secondary | ICD-10-CM

## 2012-08-13 DIAGNOSIS — C50919 Malignant neoplasm of unspecified site of unspecified female breast: Secondary | ICD-10-CM

## 2012-08-13 DIAGNOSIS — C50519 Malignant neoplasm of lower-outer quadrant of unspecified female breast: Secondary | ICD-10-CM

## 2012-08-13 MED ORDER — PEGFILGRASTIM INJECTION 6 MG/0.6ML
6.0000 mg | Freq: Once | SUBCUTANEOUS | Status: AC
  Administered 2012-08-13: 6 mg via SUBCUTANEOUS

## 2012-08-13 MED ORDER — PEGFILGRASTIM INJECTION 6 MG/0.6ML
SUBCUTANEOUS | Status: AC
  Filled 2012-08-13: qty 0.6

## 2012-08-13 NOTE — Progress Notes (Signed)
Kristen Parker presents today for injection per MD orders. Neulasta 6mg administered SQ in left Abdomen. Administration without incident. Patient tolerated well.  

## 2012-09-01 ENCOUNTER — Other Ambulatory Visit (HOSPITAL_COMMUNITY): Payer: Self-pay | Admitting: Oncology

## 2012-09-01 DIAGNOSIS — C50919 Malignant neoplasm of unspecified site of unspecified female breast: Secondary | ICD-10-CM

## 2012-09-01 DIAGNOSIS — K219 Gastro-esophageal reflux disease without esophagitis: Secondary | ICD-10-CM

## 2012-09-01 MED ORDER — CLONAZEPAM 0.5 MG PO TABS: ORAL_TABLET | ORAL | Status: DC

## 2012-09-01 MED ORDER — OMEPRAZOLE 20 MG PO CPDR: 20.0000 mg | DELAYED_RELEASE_CAPSULE | Freq: Every day | ORAL | Status: DC

## 2012-09-02 ENCOUNTER — Encounter (HOSPITAL_COMMUNITY): Payer: Medicare Other | Attending: Oncology

## 2012-09-02 VITALS — BP 144/79 | HR 134 | Temp 99.3°F | Resp 18 | Wt 220.8 lb

## 2012-09-02 DIAGNOSIS — C7952 Secondary malignant neoplasm of bone marrow: Secondary | ICD-10-CM | POA: Insufficient documentation

## 2012-09-02 DIAGNOSIS — C800 Disseminated malignant neoplasm, unspecified: Secondary | ICD-10-CM | POA: Insufficient documentation

## 2012-09-02 DIAGNOSIS — C801 Malignant (primary) neoplasm, unspecified: Secondary | ICD-10-CM | POA: Insufficient documentation

## 2012-09-02 DIAGNOSIS — C50519 Malignant neoplasm of lower-outer quadrant of unspecified female breast: Secondary | ICD-10-CM

## 2012-09-02 DIAGNOSIS — C7951 Secondary malignant neoplasm of bone: Secondary | ICD-10-CM | POA: Insufficient documentation

## 2012-09-02 DIAGNOSIS — Z5111 Encounter for antineoplastic chemotherapy: Secondary | ICD-10-CM

## 2012-09-02 DIAGNOSIS — C773 Secondary and unspecified malignant neoplasm of axilla and upper limb lymph nodes: Secondary | ICD-10-CM

## 2012-09-02 DIAGNOSIS — C50919 Malignant neoplasm of unspecified site of unspecified female breast: Secondary | ICD-10-CM | POA: Insufficient documentation

## 2012-09-02 LAB — COMPREHENSIVE METABOLIC PANEL
ALT: 10 U/L (ref 0–35)
AST: 13 U/L (ref 0–37)
CO2: 18 mEq/L — ABNORMAL LOW (ref 19–32)
Calcium: 10.3 mg/dL (ref 8.4–10.5)
Creatinine, Ser: 0.83 mg/dL (ref 0.50–1.10)
GFR calc Af Amer: 88 mL/min — ABNORMAL LOW (ref >90)
GFR calc non Af Amer: 76 mL/min — ABNORMAL LOW (ref >90)
Sodium: 134 mEq/L — ABNORMAL LOW (ref 135–145)
Total Protein: 6.7 g/dL (ref 6.0–8.3)

## 2012-09-02 LAB — CBC WITH DIFFERENTIAL/PLATELET
Basophils Absolute: 0 10*3/uL (ref 0.0–0.1)
Basophils Relative: 0 % (ref 0–1)
Eosinophils Absolute: 0 10*3/uL (ref 0.0–0.7)
Eosinophils Relative: 0 % (ref 0–5)
HCT: 29.9 % — ABNORMAL LOW (ref 36.0–46.0)
MCH: 32 pg (ref 26.0–34.0)
MCHC: 32.1 g/dL (ref 30.0–36.0)
MCV: 99.7 fL (ref 78.0–100.0)
Monocytes Absolute: 0.8 10*3/uL (ref 0.1–1.0)
Platelets: 376 10*3/uL (ref 150–400)
RDW: 18.4 % — ABNORMAL HIGH (ref 11.5–15.5)
WBC: 17 10*3/uL — ABNORMAL HIGH (ref 4.0–10.5)

## 2012-09-02 MED ORDER — DEXAMETHASONE SODIUM PHOSPHATE 10 MG/ML IJ SOLN: 10.0000 mg | Freq: Once | INTRAMUSCULAR | Status: DC

## 2012-09-02 MED ORDER — DOCETAXEL CHEMO INJECTION 160 MG/16ML
75.0000 mg/m2 | Freq: Once | INTRAVENOUS | Status: AC
  Administered 2012-09-02: 160 mg via INTRAVENOUS
  Filled 2012-09-02: qty 16

## 2012-09-02 MED ORDER — SODIUM CHLORIDE 0.9 % IV SOLN: 8.0000 mg | Freq: Once | INTRAVENOUS | Status: DC

## 2012-09-02 MED ORDER — SODIUM CHLORIDE 0.9 % IJ SOLN
10.0000 mL | INTRAMUSCULAR | Status: DC | PRN
  Administered 2012-09-02: 10 mL
  Filled 2012-09-02: qty 10

## 2012-09-02 MED ORDER — HEPARIN SOD (PORK) LOCK FLUSH 100 UNIT/ML IV SOLN
500.0000 [IU] | Freq: Once | INTRAVENOUS | Status: AC | PRN
  Administered 2012-09-02: 500 [IU]
  Filled 2012-09-02: qty 5

## 2012-09-02 MED ORDER — SODIUM CHLORIDE 0.9 % IV SOLN
Freq: Once | INTRAVENOUS | Status: AC
  Administered 2012-09-02: 10:00:00 via INTRAVENOUS

## 2012-09-02 MED ORDER — HEPARIN SOD (PORK) LOCK FLUSH 100 UNIT/ML IV SOLN
INTRAVENOUS | Status: AC
  Filled 2012-09-02: qty 5

## 2012-09-02 MED ORDER — SODIUM CHLORIDE 0.9 % IV SOLN
Freq: Once | INTRAVENOUS | Status: AC
  Administered 2012-09-02: 8 mg via INTRAVENOUS
  Filled 2012-09-02: qty 4

## 2012-09-02 NOTE — Progress Notes (Signed)
Tolerated chemo well. 

## 2012-09-03 ENCOUNTER — Encounter (HOSPITAL_BASED_OUTPATIENT_CLINIC_OR_DEPARTMENT_OTHER): Payer: Medicare Other

## 2012-09-03 VITALS — BP 133/77 | HR 120 | Temp 99.2°F

## 2012-09-03 DIAGNOSIS — C50519 Malignant neoplasm of lower-outer quadrant of unspecified female breast: Secondary | ICD-10-CM

## 2012-09-03 DIAGNOSIS — C7951 Secondary malignant neoplasm of bone: Secondary | ICD-10-CM

## 2012-09-03 DIAGNOSIS — C50919 Malignant neoplasm of unspecified site of unspecified female breast: Secondary | ICD-10-CM

## 2012-09-03 DIAGNOSIS — C773 Secondary and unspecified malignant neoplasm of axilla and upper limb lymph nodes: Secondary | ICD-10-CM

## 2012-09-03 MED ORDER — PEGFILGRASTIM INJECTION 6 MG/0.6ML
SUBCUTANEOUS | Status: AC
  Filled 2012-09-03: qty 0.6

## 2012-09-03 MED ORDER — PEGFILGRASTIM INJECTION 6 MG/0.6ML
6.0000 mg | Freq: Once | SUBCUTANEOUS | Status: AC
  Administered 2012-09-03: 6 mg via SUBCUTANEOUS

## 2012-09-03 NOTE — Progress Notes (Signed)
Kristen Parker presents today for injection per MD orders. Neulasta 6mg  administered SQ in left Abdomen. Administration without incident. Patient tolerated well.  Pt instructed to come to ER for sudden SOB, chest pain.   Pt's heart rate is increased at 120 resting. Regular rhythm. Respirations 20-24. Pt to return next Wednesday for Zometa.

## 2012-09-04 ENCOUNTER — Ambulatory Visit (HOSPITAL_COMMUNITY): Payer: Medicare Other

## 2012-09-09 ENCOUNTER — Encounter (HOSPITAL_BASED_OUTPATIENT_CLINIC_OR_DEPARTMENT_OTHER): Payer: Medicare Other

## 2012-09-09 VITALS — BP 132/64 | HR 118 | Temp 98.3°F | Resp 18

## 2012-09-09 DIAGNOSIS — C50519 Malignant neoplasm of lower-outer quadrant of unspecified female breast: Secondary | ICD-10-CM

## 2012-09-09 DIAGNOSIS — C50919 Malignant neoplasm of unspecified site of unspecified female breast: Secondary | ICD-10-CM

## 2012-09-09 DIAGNOSIS — C7951 Secondary malignant neoplasm of bone: Secondary | ICD-10-CM

## 2012-09-09 LAB — HEMOGLOBIN AND HEMATOCRIT, BLOOD
HCT: 32.2 % — ABNORMAL LOW (ref 36.0–46.0)
Hemoglobin: 10.4 g/dL — ABNORMAL LOW (ref 12.0–15.0)

## 2012-09-09 MED ORDER — HEPARIN SOD (PORK) LOCK FLUSH 100 UNIT/ML IV SOLN
INTRAVENOUS | Status: AC
  Filled 2012-09-09: qty 5

## 2012-09-09 MED ORDER — ZOLEDRONIC ACID 4 MG/5ML IV CONC
4.0000 mg | Freq: Once | INTRAVENOUS | Status: AC
  Administered 2012-09-09: 4 mg via INTRAVENOUS
  Filled 2012-09-09: qty 5

## 2012-09-09 MED ORDER — SODIUM CHLORIDE 0.9 % IJ SOLN
10.0000 mL | INTRAMUSCULAR | Status: DC | PRN
  Administered 2012-09-09: 10 mL
  Filled 2012-09-09: qty 10

## 2012-09-09 MED ORDER — HEPARIN SOD (PORK) LOCK FLUSH 100 UNIT/ML IV SOLN
500.0000 [IU] | Freq: Once | INTRAVENOUS | Status: AC | PRN
  Administered 2012-09-09: 500 [IU]
  Filled 2012-09-09: qty 5

## 2012-09-09 MED ORDER — SODIUM CHLORIDE 0.9 % IV SOLN
Freq: Once | INTRAVENOUS | Status: AC
  Administered 2012-09-09: 14:00:00 via INTRAVENOUS

## 2012-09-09 NOTE — Progress Notes (Signed)
Tolerated Zometa well.  Somewhat SOB on arrival to unit.  Stated that Tomasita Morrow, RN told her to get her hgb checked today while here for zometa. States she has been tired since ast chemo.  Spouse agreed that she had not been herself.

## 2012-09-11 ENCOUNTER — Ambulatory Visit (HOSPITAL_COMMUNITY): Payer: Medicare Other

## 2012-09-14 ENCOUNTER — Encounter (HOSPITAL_BASED_OUTPATIENT_CLINIC_OR_DEPARTMENT_OTHER): Payer: Medicare Other | Admitting: Oncology

## 2012-09-14 ENCOUNTER — Encounter (HOSPITAL_COMMUNITY): Payer: Self-pay | Admitting: Oncology

## 2012-09-14 VITALS — BP 134/60 | HR 116 | Temp 98.3°F | Resp 18 | Wt 220.2 lb

## 2012-09-14 DIAGNOSIS — C7952 Secondary malignant neoplasm of bone marrow: Secondary | ICD-10-CM

## 2012-09-14 DIAGNOSIS — C50919 Malignant neoplasm of unspecified site of unspecified female breast: Secondary | ICD-10-CM

## 2012-09-14 DIAGNOSIS — C50519 Malignant neoplasm of lower-outer quadrant of unspecified female breast: Secondary | ICD-10-CM

## 2012-09-14 DIAGNOSIS — C778 Secondary and unspecified malignant neoplasm of lymph nodes of multiple regions: Secondary | ICD-10-CM

## 2012-09-14 NOTE — Progress Notes (Signed)
Diagnoses #1 metastatic breast cancer, lobular type with multiple bones and lymph nodes involved. The lymph nodes within the right axilla status post mastectomy and her axillary dissection revealed 26 of 29 positive nodes in September 2009. We treated her initially with letrozole with a great response, then tamoxifen with a mild response, followed by Faslodex with no improvement. She is now and docetaxel every 21 days and she has had 5 doses thus far. She will have one more does note a PET scan and then we can give her break in therapy hopefully. Her cancer marker has declined very nicely. She is feeling very weak and tired. His aching in her muscles of her legs some numbness in her fingers and toes and drying of her eyes. She is using a moisturizing eyedrop every 3 hours.  She looks good they'll appetite is too good she has not lost any weight. She has less bone pain she thinks. She has not reduce her pain medication I think because of fear and anxiety over being in pain.  Her chest wall the right remains clear vital signs are stable left breast is negative. Port-A-Cath is intact. Lungs are clear. She has no lymphadenopathy. Heart shows a regular rhythm and rate without murmur rub or gallop. Abdomen is obese without obvious organomegaly. She has decreased bowel sounds. She has no leg edema of the right arm is swollen and she has a compression sleeve and I wanted double check with physical therapy make sure that nothing else it we need to do. I've given her prescription for a replacement compression sleeve  We'll see her right after the PET scan and then hopefully give her a break. We will then check her cancer marker monthly with her Zometa. She asked how long she can be off therapy and that will be as long as we can possibly give her without to significant a progressive state.

## 2012-09-14 NOTE — Patient Instructions (Addendum)
[facility]  Discharge Instructions  RECOMMENDATIONS MADE BY THE CONSULTANT AND ANY TEST RESULTS WILL BE SENT TO YOUR REFERRING DOCTOR.   EXAM FINDINGS BY MD TODAY AND SIGNS AND SYMPTOMS TO REPORT TO CLINIC OR PRIMARY MD: Exam and discussion by MD.  Leg discomfort is due to the Taxotere.  Fatigue is due to the Taxotere.  You can take your pain pill every 6 hours instead of every 4 hours if you want.  After cycle 6 we will check a PET scan.  Prescription for Compression sleeve given.    MEDICATIONS PRESCRIBED: none   INSTRUCTIONS GIVEN AND DISCUSSED: Other :  Report uncontrolled pain, increased shortness of breath or other problems. PT consult follow-up on Wednesday @ 1pm.  SPECIAL INSTRUCTIONS/FOLLOW-UP: Return to Clinic as scheduled.   I acknowledge that I have been informed and understand all the instructions given to me and received a copy. I do not have any more questions at this time, but understand that I may call the Specialty Clinic at [facility] at [phone] during business hours should I have any further questions or need assistance in obtaining follow-up care.    __________________________________________  _____________  __________ Signature of Patient or Authorized Representative            Date                   Time    __________________________________________ Nurse's Signature

## 2012-09-16 ENCOUNTER — Ambulatory Visit (HOSPITAL_COMMUNITY)
Admission: RE | Admit: 2012-09-16 | Discharge: 2012-09-16 | Disposition: A | Discharge status: Home / Self Care | Payer: Medicare Other | Source: Ambulatory Visit | Attending: Oncology | Admitting: Oncology

## 2012-09-16 DIAGNOSIS — I972 Postmastectomy lymphedema syndrome: Secondary | ICD-10-CM | POA: Insufficient documentation

## 2012-09-16 DIAGNOSIS — IMO0001 Reserved for inherently not codable concepts without codable children: Secondary | ICD-10-CM | POA: Insufficient documentation

## 2012-09-16 DIAGNOSIS — M7989 Other specified soft tissue disorders: Secondary | ICD-10-CM | POA: Insufficient documentation

## 2012-09-16 DIAGNOSIS — M79609 Pain in unspecified limb: Secondary | ICD-10-CM | POA: Insufficient documentation

## 2012-09-16 NOTE — Evaluation (Cosign Needed)
Physical Therapy Evaluation  Patient Details  Name: Kristen Parker MRN: [id number] Date of Birth: Jul 21, 1951  Today's Date: 10/02/2012 Time:1300  - 1344    Visit#:  1 of   12 Re-eval: 10/16/12 Assessment Diagnosis: R lymphedema Prior Therapy: lymph massage and compression wrapping.  Authorization: medicare  Authorization Visit#:  1 of   10  Past Medical History:  Past Medical History  Diagnosis Date  . Diabetes mellitus   . Graves disease   . Cancer     breast  . Breast CA 05/22/2011  . Thyroid disease     Graves disease/iodine ablation 16 yrs ago  . Hypertension   . Radiation   . Bone metastases     T-spine  . Bone metastases 06/20/2011   Past Surgical History:  Past Surgical History  Procedure Date  . Breast biopsy     left  . Axillary node dissection     biopsy  . Breast biopsy     right  . Eye surgery     laser retinal tears  . Acdf   . Cervical disc surgery     ant. fusion with plate and screws  . Cholecystectomy   . Cesarean section   . Tubal ligation   . Reproductive     Removal of rt reproductive system  . Dilatation and currettage   . Mastectomy     rt. radical  29 lymph nodes  . Portacath placement 2009  . Appendectomy   . Partial hysterectomy     Subjective Symptoms/Limitations Symptoms: Ms. Ledin is well known to this clinic.  She was diagnoses with R breast cancer with 26/29 positive nodes in September 2009.  The patient has had difficulty with lymphedema for several years.  She currently uses a compression garment as well as a home jobst pump  and is knowledgable with self massage.  Despite doing all of the above Ms. Porzio noted approximately two months ago that she was having a significant amount of tightness and discomfort in her left UE.  She has been referred to therapy to get her swelling and discomfort  under control. Pain Assessment Currently in Pain?: Yes Pain Score:   3 Pain Location: Arm Pain Orientation: Left Pain Type:  Acute pain Pain Radiating Towards: mid arm to wrist.   Cognition/Observation Observation/Other Assessments Observations: Pt R UE is significantly swollen Other Assessments: volumes L 3013.92; R 3849.30   Exercise/Treatments    Manual Therapy Manual Therapy: Edema management Edema Management: lymph massage to R UE to decrease swelling.  Physical Therapy Assessment and Plan PT Assessment and Plan Clinical Impression Statement: Pt with significant lymphedema in R UE who will benefit from manual lymph massage and compression wrapping to decrease swelling and discomfort. Rehab Potential: Good PT Frequency: Min 3X/week PT Duration: 4 weeks PT Treatment/Interventions: Patient/family education;Manual techniques PT Plan: manual lymph massage and compression wrapping to decrease swelling.    Goals  STG:  2 weeks 1)  Pt to verbalize I in HEP 2) Volume reduced by 50%  LTG: 4 weeks 1)  Pt volume reduced by 80%  2)  Pt to verbalize understanding of the maintenance phase of treatment. 3) Pt to have received full length sleeve.  Problem List Patient Active Problem List  Diagnosis  . ACHILLES TENDON TEAR  . Breast CA  . Bone metastases  . UTI (lower urinary tract infection)   Please note; evaluation reopened to resend to MD who did not receive first note.    GP  [id number] CJ  [id number] CI  Andre Swander,CINDY 10/02/2012, 9:35 AM  Physician Documentation Your signature is required to indicate approval of the treatment plan as stated above.  Please sign and either send electronically or make a copy of this report for your files and return this physician signed original.   Please mark one 1.__approve of plan  2. ___approve of plan with the following conditions.   ______________________________                                                          _____________________ Physician Signature                                                                                                              Date

## 2012-09-17 ENCOUNTER — Ambulatory Visit (HOSPITAL_COMMUNITY)
Admission: RE | Admit: 2012-09-17 | Discharge: 2012-09-17 | Disposition: A | Discharge status: Home / Self Care | Payer: Medicare Other | Source: Ambulatory Visit | Attending: Oncology | Admitting: Oncology

## 2012-09-17 NOTE — Progress Notes (Signed)
Physical Therapy Treatment Patient Details  Name: Kristen Parker MRN: [id number] Date of Birth: [birth year]  Today's Date: 09/17/2012 Time: 1020-1100 PT Time Calculation (min): 40 min Visit#: 2  of 12   Re-eval: 10/16/12 Authorization: medicare  Authorization Visit#: 2  of 10  Charges:  Manual 38'   Subjective: Symptoms/Limitations Symptoms: Pt. states her Right arm is very tight and uncomfortable.  States she still has a week of chemotherapy and will not be able to come while she's doing it.   Pain Assessment Currently in Pain?: Yes Pain Score:   2 Pain Location: Arm Pain Orientation: Right   Objective: Manual Therapy Manual Therapy: Edema management Edema Management: MLD for R UE routing fluid to L axillary and R inguinal nodes.  Physical Therapy Assessment and Plan PT Assessment and Plan Clinical Impression Statement: Pt. verbalized relief following MLD to R UE.  Noted thickness/heaviness of tissue in R UE and lateral trunk area.   Rehab Potential: Good PT Frequency: Min 3X/week PT Duration: 4 weeks PT Treatment/Interventions: Patient/family education;Manual techniques PT Plan: Continue MLD; pt. to get fitted tomorrow for new compression garment; will have 1 visit next week then skip 1 week due to chemotherapy.     Problem List Patient Active Problem List  Diagnosis  . ACHILLES TENDON TEAR  . Breast CA  . Bone metastases  . UTI (lower urinary tract infection)    PT - End of Session Activity Tolerance: Patient tolerated treatment well General Behavior During Session: [facility] for tasks performed Cognition: [facility] for tasks performed PT Plan of Care PT Home Exercise Plan: Pt verbalizes knowledge of self massage.   [name], PTA/CLT 09/17/2012, 12:03 PM

## 2012-09-18 ENCOUNTER — Ambulatory Visit (HOSPITAL_COMMUNITY): Payer: Medicare Other | Admitting: Oncology

## 2012-09-22 ENCOUNTER — Ambulatory Visit (HOSPITAL_COMMUNITY)
Admission: RE | Admit: 2012-09-22 | Discharge: 2012-09-22 | Disposition: A | Discharge status: Home / Self Care | Payer: Medicare Other | Source: Ambulatory Visit | Attending: Family Medicine | Admitting: Family Medicine

## 2012-09-22 NOTE — Progress Notes (Signed)
Physical Therapy Treatment Patient Details  Name: Kristen Parker MRN: [id number] Date of Birth: 19-Sep-1951  Today's Date: 09/22/2012 Time: 4098-1191 PT Time Calculation (min): 32 min Visit#: 3  of 12   Re-eval: 10/16/12 Authorization: medicare  Authorization Visit#: 3  of 10   Charges:  Manual 30'  Subjective: Symptoms/Limitations Symptoms: Pt. reports she begins chemotherapy tomorrow; will not resume her MLD until after her 10 days of chemo.  Pt. fitted for new R UE sleeve yesterday and will be getting it next week as well. Pain Assessment Currently in Pain?: Yes Pain Score:   2 Pain Location: Arm Pain Orientation: Right   Objective: Manual Therapy Manual Therapy: Edema management Edema Management: MLD for R UE routing fluid to L axillary and R inguinal nodes.  Physical Therapy Assessment and Plan PT Assessment and Plan Clinical Impression Statement: Pt. with good results following last session, however heaviness returned again today.  Explained to pt. new garment will help keep the fluid from returning. Rehab Potential: Good PT Frequency: Min 3X/week PT Duration: 4 weeks PT Treatment/Interventions: Patient/family education;Manual techniques PT Plan: Continue MLD; resume MLD after chemo treatment finished.     Problem List Patient Active Problem List  Diagnosis  . ACHILLES TENDON TEAR  . Breast CA  . Bone metastases  . UTI (lower urinary tract infection)    PT - End of Session Activity Tolerance: Patient tolerated treatment well General Behavior During Session: [facility] for tasks performed Cognition: [facility] for tasks performed PT Plan of Care PT Home Exercise Plan: Pt verbalizes knowledge of self massage.    [name], PTA/CLT 09/22/2012, 6:12 PM

## 2012-09-23 ENCOUNTER — Encounter (HOSPITAL_BASED_OUTPATIENT_CLINIC_OR_DEPARTMENT_OTHER): Payer: Medicare Other

## 2012-09-23 ENCOUNTER — Ambulatory Visit (HOSPITAL_COMMUNITY): Payer: Medicare Other | Admitting: Oncology

## 2012-09-23 VITALS — BP 143/84 | HR 123 | Temp 98.0°F | Resp 20 | Wt 227.2 lb

## 2012-09-23 DIAGNOSIS — Z5111 Encounter for antineoplastic chemotherapy: Secondary | ICD-10-CM

## 2012-09-23 DIAGNOSIS — C50919 Malignant neoplasm of unspecified site of unspecified female breast: Secondary | ICD-10-CM

## 2012-09-23 DIAGNOSIS — C7952 Secondary malignant neoplasm of bone marrow: Secondary | ICD-10-CM

## 2012-09-23 DIAGNOSIS — C773 Secondary and unspecified malignant neoplasm of axilla and upper limb lymph nodes: Secondary | ICD-10-CM

## 2012-09-23 DIAGNOSIS — C50519 Malignant neoplasm of lower-outer quadrant of unspecified female breast: Secondary | ICD-10-CM

## 2012-09-23 LAB — COMPREHENSIVE METABOLIC PANEL
ALT: 9 U/L (ref 0–35)
AST: 15 U/L (ref 0–37)
Albumin: 3.6 g/dL (ref 3.5–5.2)
Alkaline Phosphatase: 83 U/L (ref 39–117)
Calcium: 9.9 mg/dL (ref 8.4–10.5)
Potassium: 4.3 mEq/L (ref 3.5–5.1)
Sodium: 139 mEq/L (ref 135–145)
Total Protein: 6.5 g/dL (ref 6.0–8.3)

## 2012-09-23 LAB — CBC WITH DIFFERENTIAL/PLATELET
Basophils Absolute: 0 10*3/uL (ref 0.0–0.1)
Eosinophils Absolute: 0 10*3/uL (ref 0.0–0.7)
Eosinophils Relative: 0 % (ref 0–5)
Lymphocytes Relative: 5 % — ABNORMAL LOW (ref 12–46)
MCH: 33 pg (ref 26.0–34.0)
MCV: 102.8 fL — ABNORMAL HIGH (ref 78.0–100.0)
Neutrophils Relative %: 91 % — ABNORMAL HIGH (ref 43–77)
Platelets: 293 10*3/uL (ref 150–400)
RBC: 2.82 MIL/uL — ABNORMAL LOW (ref 3.87–5.11)
RDW: 17.8 % — ABNORMAL HIGH (ref 11.5–15.5)
WBC: 16.8 10*3/uL — ABNORMAL HIGH (ref 4.0–10.5)

## 2012-09-23 MED ORDER — HEPARIN SOD (PORK) LOCK FLUSH 100 UNIT/ML IV SOLN
INTRAVENOUS | Status: AC
  Filled 2012-09-23: qty 5

## 2012-09-23 MED ORDER — SODIUM CHLORIDE 0.9 % IV SOLN
Freq: Once | INTRAVENOUS | Status: AC
  Administered 2012-09-23: 8 mg via INTRAVENOUS
  Filled 2012-09-23: qty 4

## 2012-09-23 MED ORDER — SODIUM CHLORIDE 0.9 % IJ SOLN
INTRAMUSCULAR | Status: AC
  Filled 2012-09-23: qty 10

## 2012-09-23 MED ORDER — DOCETAXEL CHEMO INJECTION 160 MG/16ML
75.0000 mg/m2 | Freq: Once | INTRAVENOUS | Status: AC
  Administered 2012-09-23: 160 mg via INTRAVENOUS
  Filled 2012-09-23: qty 16

## 2012-09-23 MED ORDER — DEXAMETHASONE SODIUM PHOSPHATE 10 MG/ML IJ SOLN: 10.0000 mg | Freq: Once | INTRAMUSCULAR | Status: DC

## 2012-09-23 MED ORDER — SODIUM CHLORIDE 0.9 % IV SOLN: 8.0000 mg | Freq: Once | INTRAVENOUS | Status: DC

## 2012-09-23 MED ORDER — SODIUM CHLORIDE 0.9 % IV SOLN
Freq: Once | INTRAVENOUS | Status: AC
  Administered 2012-09-23: 11:00:00 via INTRAVENOUS

## 2012-09-23 MED ORDER — SODIUM CHLORIDE 0.9 % IJ SOLN
10.0000 mL | INTRAMUSCULAR | Status: DC | PRN
  Administered 2012-09-23: 10 mL
  Filled 2012-09-23: qty 10

## 2012-09-23 MED ORDER — HEPARIN SOD (PORK) LOCK FLUSH 100 UNIT/ML IV SOLN
500.0000 [IU] | Freq: Once | INTRAVENOUS | Status: AC | PRN
  Administered 2012-09-23: 500 [IU]
  Filled 2012-09-23: qty 5

## 2012-09-23 NOTE — Progress Notes (Signed)
Tolerated chemo well. 

## 2012-09-24 ENCOUNTER — Encounter (HOSPITAL_BASED_OUTPATIENT_CLINIC_OR_DEPARTMENT_OTHER): Payer: Medicare Other

## 2012-09-24 VITALS — BP 117/72 | HR 130 | Temp 98.0°F | Resp 22

## 2012-09-24 DIAGNOSIS — C50919 Malignant neoplasm of unspecified site of unspecified female breast: Secondary | ICD-10-CM

## 2012-09-24 DIAGNOSIS — C773 Secondary and unspecified malignant neoplasm of axilla and upper limb lymph nodes: Secondary | ICD-10-CM

## 2012-09-24 DIAGNOSIS — C50519 Malignant neoplasm of lower-outer quadrant of unspecified female breast: Secondary | ICD-10-CM

## 2012-09-24 DIAGNOSIS — C7951 Secondary malignant neoplasm of bone: Secondary | ICD-10-CM

## 2012-09-24 MED ORDER — PEGFILGRASTIM INJECTION 6 MG/0.6ML
6.0000 mg | Freq: Once | SUBCUTANEOUS | Status: AC
  Administered 2012-09-24: 6 mg via SUBCUTANEOUS

## 2012-09-24 MED ORDER — PEGFILGRASTIM INJECTION 6 MG/0.6ML
SUBCUTANEOUS | Status: AC
  Filled 2012-09-24: qty 0.6

## 2012-09-24 NOTE — Progress Notes (Signed)
Kristen Parker presents today for injection per MD orders. Neulasta 6mg administered SQ in right Abdomen. Administration without incident. Patient tolerated well.  

## 2012-09-29 ENCOUNTER — Other Ambulatory Visit (HOSPITAL_COMMUNITY): Payer: Self-pay | Admitting: Oncology

## 2012-09-29 DIAGNOSIS — C50919 Malignant neoplasm of unspecified site of unspecified female breast: Secondary | ICD-10-CM

## 2012-09-29 MED ORDER — FIRST-MARYS MOUTHWASH MT SUSP: 5.0000 mL | Freq: Four times a day (QID) | OROMUCOSAL | Status: DC | PRN

## 2012-10-05 ENCOUNTER — Ambulatory Visit (HOSPITAL_COMMUNITY)
Admission: RE | Admit: 2012-10-05 | Discharge: 2012-10-05 | Disposition: A | Discharge status: Home / Self Care | Payer: Medicare Other | Source: Ambulatory Visit | Attending: Oncology | Admitting: Oncology

## 2012-10-05 ENCOUNTER — Encounter (HOSPITAL_COMMUNITY): Admission: RE | Admit: 2012-10-05 | Payer: Medicare Other | Source: Ambulatory Visit

## 2012-10-05 DIAGNOSIS — M79609 Pain in unspecified limb: Secondary | ICD-10-CM | POA: Insufficient documentation

## 2012-10-05 DIAGNOSIS — M7989 Other specified soft tissue disorders: Secondary | ICD-10-CM | POA: Insufficient documentation

## 2012-10-05 DIAGNOSIS — IMO0001 Reserved for inherently not codable concepts without codable children: Secondary | ICD-10-CM | POA: Insufficient documentation

## 2012-10-05 DIAGNOSIS — I972 Postmastectomy lymphedema syndrome: Secondary | ICD-10-CM | POA: Insufficient documentation

## 2012-10-05 NOTE — Progress Notes (Signed)
Physical Therapy Treatment Patient Details  Name: Kristen Parker MRN: [id number] Date of Birth: 13-May-1951  Today's Date: 10/05/2012 Time: 1520-1600 PT Time Calculation (min): 40 min Visit#: 4  of 12   Re-eval: 10/16/12 Charges:  Manual 38' Authorization: medicare  Authorization Visit#: 4  of 10    Subjective: Symptoms/Limitations Symptoms: Pt. states she goes to get her PET scan tomorrow after her therapy session and hopes she is done with chemotherapy.  States she can tell her arm is more swollen since she has not gotten therapy X 1 week. Pain Assessment Currently in Pain?: Yes Pain Score:   1 Pain Location: Arm Pain Orientation: Right  Objective: Manual Therapy Manual Therapy: Edema management Edema Management: MLD for R UE routing fluid to L axillary and R inguinal nodes  Physical Therapy Assessment and Plan PT Assessment and Plan Clinical Impression Statement: Pt. with increased density in R LE today and thorax.  To get new garment tomorrow; compliant with using compression pump at home as needed and using garment. Rehab Potential: Good PT Frequency: Min 3X/week PT Duration: 4 weeks PT Treatment/Interventions: Patient/family education;Manual techniques PT Plan: Continue MLD; measure at end of this week (X3 more visits).     Problem List Patient Active Problem List  Diagnosis  . ACHILLES TENDON TEAR  . Breast CA  . Bone metastases  . UTI (lower urinary tract infection)    PT - End of Session Activity Tolerance: Patient tolerated treatment well General Behavior During Session: [facility] for tasks performed Cognition: [facility] for tasks performed PT Plan of Care PT Home Exercise Plan: Pt verbalizes knowledge of self massage.   [name], PTA/CLT 10/05/2012, 5:23 PM

## 2012-10-06 ENCOUNTER — Ambulatory Visit (HOSPITAL_COMMUNITY)
Admission: RE | Admit: 2012-10-06 | Discharge: 2012-10-06 | Disposition: A | Discharge status: Home / Self Care | Payer: Medicare Other | Source: Ambulatory Visit | Attending: Oncology | Admitting: Oncology

## 2012-10-06 ENCOUNTER — Encounter (HOSPITAL_COMMUNITY)
Admission: RE | Admit: 2012-10-06 | Discharge: 2012-10-06 | Disposition: A | Discharge status: Home / Self Care | Payer: Medicare Other | Source: Ambulatory Visit | Attending: Oncology | Admitting: Oncology

## 2012-10-06 DIAGNOSIS — C50919 Malignant neoplasm of unspecified site of unspecified female breast: Secondary | ICD-10-CM

## 2012-10-06 DIAGNOSIS — Z9221 Personal history of antineoplastic chemotherapy: Secondary | ICD-10-CM | POA: Insufficient documentation

## 2012-10-06 DIAGNOSIS — C7952 Secondary malignant neoplasm of bone marrow: Secondary | ICD-10-CM | POA: Insufficient documentation

## 2012-10-06 DIAGNOSIS — C7951 Secondary malignant neoplasm of bone: Secondary | ICD-10-CM

## 2012-10-06 MED ORDER — FLUDEOXYGLUCOSE F - 18 (FDG) INJECTION
17.9000 | Freq: Once | INTRAVENOUS | Status: AC | PRN
  Administered 2012-10-06: 17.9 via INTRAVENOUS

## 2012-10-06 NOTE — Progress Notes (Signed)
Physical Therapy Treatment Patient Details  Name: Kristen Parker MRN: [id number] Date of Birth: 10/03/1951  Today's Date: 10/06/2012 Time: 1020-1100 PT Time Calculation (min): 40 min Visit#: 5  of 12   Re-eval: 10/16/12 Authorization: medicare  Authorization Visit#: 5  of 10   Charges:  Manual 38'  Subjective: Symptoms/Limitations Symptoms: Pt. reports her arm felt much better yesterday after therapy.  States she voided frequently and is no longer having that numb feeling in her upper arm.  States she's picking up her new compression sleeve after getting her PET scan today. Pain Assessment Currently in Pain?: No/denies   Objective: Manual Therapy Manual Therapy: Other (comment) Edema Management: MLD for R UE routing fluid to L axillary and R inguinal nodes  Physical Therapy Assessment and Plan PT Assessment and Plan Clinical Impression Statement: Less density in R UE and thorax today.  Overall relief with good movement of fluid out of R UE.  PT Plan: Continue MLD; measure at end of this week (X2 more visits).     Problem List Patient Active Problem List  Diagnosis  . ACHILLES TENDON TEAR  . Breast CA  . Bone metastases  . UTI (lower urinary tract infection)    PT - End of Session Activity Tolerance: Patient tolerated treatment well General Behavior During Session: [facility] for tasks performed Cognition: [facility] for tasks performed   [name], PTA/CLT 10/06/2012, 11:27 AM

## 2012-10-07 ENCOUNTER — Encounter (HOSPITAL_BASED_OUTPATIENT_CLINIC_OR_DEPARTMENT_OTHER): Payer: Medicare Other

## 2012-10-07 ENCOUNTER — Encounter (HOSPITAL_COMMUNITY): Payer: Medicare Other | Attending: Oncology | Admitting: Oncology

## 2012-10-07 ENCOUNTER — Ambulatory Visit (HOSPITAL_COMMUNITY)
Admission: RE | Admit: 2012-10-07 | Discharge: 2012-10-07 | Disposition: A | Discharge status: Home / Self Care | Payer: Medicare Other | Source: Ambulatory Visit | Attending: Family Medicine | Admitting: Family Medicine

## 2012-10-07 VITALS — BP 145/80 | HR 128 | Temp 97.9°F | Resp 20 | Wt 221.3 lb

## 2012-10-07 DIAGNOSIS — G1229 Other motor neuron disease: Secondary | ICD-10-CM

## 2012-10-07 DIAGNOSIS — C773 Secondary and unspecified malignant neoplasm of axilla and upper limb lymph nodes: Secondary | ICD-10-CM

## 2012-10-07 DIAGNOSIS — C7951 Secondary malignant neoplasm of bone: Secondary | ICD-10-CM

## 2012-10-07 DIAGNOSIS — C801 Malignant (primary) neoplasm, unspecified: Secondary | ICD-10-CM | POA: Insufficient documentation

## 2012-10-07 DIAGNOSIS — C50919 Malignant neoplasm of unspecified site of unspecified female breast: Secondary | ICD-10-CM | POA: Insufficient documentation

## 2012-10-07 DIAGNOSIS — C50519 Malignant neoplasm of lower-outer quadrant of unspecified female breast: Secondary | ICD-10-CM

## 2012-10-07 MED ORDER — HEPARIN SOD (PORK) LOCK FLUSH 100 UNIT/ML IV SOLN
INTRAVENOUS | Status: AC
  Filled 2012-10-07: qty 5

## 2012-10-07 MED ORDER — SODIUM CHLORIDE 0.9 % IJ SOLN
10.0000 mL | INTRAMUSCULAR | Status: DC | PRN
  Administered 2012-10-07: 10 mL
  Filled 2012-10-07: qty 10

## 2012-10-07 MED ORDER — SODIUM CHLORIDE 0.9 % IJ SOLN
INTRAMUSCULAR | Status: AC
  Filled 2012-10-07: qty 10

## 2012-10-07 MED ORDER — SODIUM CHLORIDE 0.9 % IV SOLN
Freq: Once | INTRAVENOUS | Status: AC
  Administered 2012-10-07: 14:00:00 via INTRAVENOUS

## 2012-10-07 MED ORDER — HEPARIN SOD (PORK) LOCK FLUSH 100 UNIT/ML IV SOLN
500.0000 [IU] | Freq: Once | INTRAVENOUS | Status: AC | PRN
  Administered 2012-10-07: 500 [IU]
  Filled 2012-10-07: qty 5

## 2012-10-07 MED ORDER — ZOLEDRONIC ACID 4 MG/5ML IV CONC
4.0000 mg | Freq: Once | INTRAVENOUS | Status: AC
  Administered 2012-10-07: 4 mg via INTRAVENOUS
  Filled 2012-10-07: qty 5

## 2012-10-07 NOTE — Progress Notes (Signed)
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 We have treated her with letrozole initially with a great response, and failure. We switched to tamoxifen with only a mild response, followed by no response to Faslodex. She is now status post 6 cycles of docetaxel every 21 days. She is here with her husband who over her PET scan findings which showed tremendous improvement paralleling her drop in CA 27.29. 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 She is tired however and fatigues easily. She would therefore like to take a break in therapy. This is fine with me. We will check her cancer marker in 4 weeks and 8 weeks and I will see her after  the new year. She is delighted with the news. We'll see her as scheduled

## 2012-10-07 NOTE — Patient Instructions (Addendum)
[facility]  Discharge Instructions  RECOMMENDATIONS MADE BY THE CONSULTANT AND ANY TEST RESULTS WILL BE SENT TO YOUR REFERRING DOCTOR.   EXAM FINDINGS BY MD TODAY AND SIGNS AND SYMPTOMS TO REPORT TO CLINIC OR PRIMARY MD: Your PET scan is great!  We will give you a break until after the holidays.  You can get your flu vaccine next week and you can eat from the buffet in 10 days.  MEDICATIONS PRESCRIBED: none   INSTRUCTIONS GIVEN AND DISCUSSED: Other :  Report any new lumps, increased bone pain or shortness of breath.  SPECIAL INSTRUCTIONS/FOLLOW-UP: Return to Clinic:  Every 4 weeks for zometa and blood work and to see MD in January.   I acknowledge that I have been informed and understand all the instructions given to me and received a copy. I do not have any more questions at this time, but understand that I may call the Specialty Clinic at [facility] at [phone] during business hours should I have any further questions or need assistance in obtaining follow-up care.    __________________________________________  _____________  __________ Signature of Patient or Authorized Representative            Date                   Time    __________________________________________ Nurse's Signature

## 2012-10-07 NOTE — Progress Notes (Signed)
Physical Therapy Lymphedema Treatment Patient Details  Name: BAILEI BUIST MRN: [id number] Date of Birth: [birth year]  Today's Date: 10/07/2012 Time: 1110-1150 PT Time Calculation (min): 40 min Visit#: 6  of 12   Re-eval: 10/16/12 Authorization: medicare  Authorization Visit#: 6  of 10   Charges:  Manual 38'  Subjective: Symptoms/Limitations Symptoms: Pt. states she again lost alot of fluid yesterday and her bowels moved good as well.  States an overall improvement in R UE.  Pt. with new compression sleve, however very tight at wrist leaving indentations/redness.    Pt is going to contact facility regarding fit of her garment. Pain Assessment Currently in Pain?: No/denies  Objective: Manual Therapy Manual Therapy: Other (comment) Edema Management: MLD for R UE routing fluid to L axillary and R inguinal nodes   Physical Therapy Assessment and Plan PT Assessment and Plan Clinical Impression Statement: Continued movement of fluid out of R UE.  Overall good response to MLD. PT Plan: Continue MLD; measure next visit.     Problem List Patient Active Problem List  Diagnosis  . ACHILLES TENDON TEAR  . Breast CA  . Bone metastases  . UTI (lower urinary tract infection)    PT - End of Session Activity Tolerance: Patient tolerated treatment well General Behavior During Session: [facility] for tasks performed Cognition: [facility] for tasks performed   [name], PTA/CLT 10/07/2012, 12:03 PM

## 2012-10-07 NOTE — Progress Notes (Signed)
Tolerated well

## 2012-10-08 ENCOUNTER — Ambulatory Visit (HOSPITAL_COMMUNITY)
Admission: RE | Admit: 2012-10-08 | Discharge: 2012-10-08 | Disposition: A | Discharge status: Home / Self Care | Payer: Medicare Other | Source: Ambulatory Visit | Attending: Family Medicine | Admitting: Family Medicine

## 2012-10-08 NOTE — Progress Notes (Signed)
Physical Therapy Treatment Patient Details  Name: BLAYNE GARLICK MRN: [id number] Date of Birth: Aug 29, 1951  Today's Date: 10/08/2012 Time: 1110-1150 PT Time Calculation (min): 40 min Visit#: 7  of 12   Re-eval: 10/16/12 Authorization: medicare  Authorization Visit#: 7  of 10   Charges:  Manual 38'  Subjective: Symptoms/Limitations Symptoms: Pt. states her arm is continuing to feel better.  States she is returning to [facility] today to get her compression sleeve re-ordered due to improper fit. Pain Assessment Currently in Pain?: No/denies   Objective: Manual Therapy Manual Therapy: Other (comment) Edema Management: MLD for R UE routing fluid to L axillary and R inguinal nodes   R UE measurements taken today and compared to 3 weeks prior   Physical Therapy Assessment and Plan PT Assessment and Plan Clinical Impression Statement: R UE re-measured today with overall reduction of 118.11 cc of fluid.  Pt. overall responding well to treatment. PT Plan: Continue MLD; re-measure and G-codes X 3 more visits.     Problem List Patient Active Problem List  Diagnosis  . ACHILLES TENDON TEAR  . Breast CA  . Bone metastases  . UTI (lower urinary tract infection)    PT - End of Session Activity Tolerance: Patient tolerated treatment well General Behavior During Session: [facility] for tasks performed Cognition: [facility] for tasks performed   [name], PTA/CLT 10/08/2012, 12:05 PM

## 2012-10-12 ENCOUNTER — Ambulatory Visit (HOSPITAL_COMMUNITY)
Admission: RE | Admit: 2012-10-12 | Discharge: 2012-10-12 | Disposition: A | Discharge status: Home / Self Care | Payer: Medicare Other | Source: Ambulatory Visit | Attending: Family Medicine | Admitting: Family Medicine

## 2012-10-12 NOTE — Progress Notes (Signed)
Physical Therapy Treatment Patient Details  Name: Kristen Parker MRN: [id number] Date of Birth: 03/08/1951  Today's Date: 10/12/2012 Time: 1526-1610 PT Time Calculation (min): 44 min Visit#: 8  of 12   Re-eval: 10/16/12 Authorization: medicare  Authorization Visit#: 8  of 10   Charges:  Manual 40'  Subjective: Symptoms/Limitations Symptoms: Pt. reports increased thickness/swelling in R forearm today.  Reports some parasthesias around shoulder and underneath UE at proximal end but no pain. Pain Assessment Currently in Pain?: No/denies  Objective: Manual Therapy Manual Therapy: Other (comment) Edema Management: MLD for R UE routing fluid to L axillary and R inguinal nodes   Physical Therapy Assessment and Plan PT Assessment and Plan Clinical Impression Statement: Pt. with noted increased density/pitting edema in R forearm area and up into shoulder.  Pt. re-measured for sleeve and is ordered. PT Plan: Continue MLD; re-measure and G-codes X 2 more visits.     Problem List Patient Active Problem List  Diagnosis  . ACHILLES TENDON TEAR  . Breast CA  . Bone metastases  . UTI (lower urinary tract infection)    PT - End of Session Activity Tolerance: Patient tolerated treatment well General Behavior During Session: [facility] for tasks performed Cognition: [facility] for tasks performed   [name], PTA/CLT 10/12/2012, 4:36 PM

## 2012-10-13 ENCOUNTER — Ambulatory Visit (HOSPITAL_COMMUNITY)
Admission: RE | Admit: 2012-10-13 | Discharge: 2012-10-13 | Disposition: A | Discharge status: Home / Self Care | Payer: Medicare Other | Source: Ambulatory Visit | Attending: Family Medicine | Admitting: Family Medicine

## 2012-10-13 NOTE — Progress Notes (Signed)
Physical Therapy Lymphedema Treatment Patient Details  Name: Kristen Parker MRN: [id number] Date of Birth: 09/23/1951  Today's Date: 10/13/2012 Time: 1030-1115 PT Time Calculation (min): 45 min Visit#: 9  of 12   Re-eval: 10/16/12 Authorization: medicare  Authorization Visit#: 9  of 10   Charges:  Manual 40'  Subjective: Symptoms/Limitations Symptoms: Pt. states she pumped her arm yesterday evening but forgot to put her Reid sleeve on for bed.  Overall decreased congestion in R UE today.  Not hurting but still having the parasthesias.  Objective: Manual Therapy Manual Therapy: Other (comment) Edema Management: MLD for R UE routing fluid to L axillary and R inguinal nodes   Physical Therapy Assessment and Plan PT Assessment and Plan Clinical Impression Statement: Less density/congestion noted in R UE today.  Continues to respond well to MLD. PT Plan: Continue MLD; re-measure and G-codes next visit.     Problem List Patient Active Problem List  Diagnosis  . ACHILLES TENDON TEAR  . Breast CA  . Bone metastases  . UTI (lower urinary tract infection)    PT - End of Session Activity Tolerance: Patient tolerated treatment well General Behavior During Session: [facility] for tasks performed Cognition: [facility] for tasks performed   [name], PTA/CLT 10/13/2012, 11:34 AM

## 2012-10-14 ENCOUNTER — Other Ambulatory Visit (HOSPITAL_COMMUNITY): Payer: Self-pay | Admitting: Oncology

## 2012-10-14 ENCOUNTER — Ambulatory Visit (HOSPITAL_COMMUNITY)
Admission: RE | Admit: 2012-10-14 | Discharge: 2012-10-14 | Disposition: A | Discharge status: Home / Self Care | Payer: Medicare Other | Source: Ambulatory Visit | Attending: Family Medicine | Admitting: Family Medicine

## 2012-10-14 DIAGNOSIS — C50919 Malignant neoplasm of unspecified site of unspecified female breast: Secondary | ICD-10-CM

## 2012-10-14 MED ORDER — MEGESTROL ACETATE 40 MG PO TABS: 40.0000 mg | ORAL_TABLET | Freq: Every day | ORAL | Status: DC

## 2012-10-14 NOTE — Progress Notes (Addendum)
Physical Therapy Lymphedema Re-evaluation  Patient Details  Name: Kristen Parker MRN: [id number] Date of Birth: [birth year]  Today's Date: 10/14/2012 Time: 1540-1620 PT Time Calculation (min): 40 min Visit#: 10  of 12   Re-eval: 10/16/12 Authorization: medicare  Authorization Visit#: 10  of 10   Charges:  Manual 38'  Subjective: Symptoms/Limitations Symptoms: Pt. reports compliance with her HEP and using her pump and compression garment.  States it feels better overall, less heaviness and numbness. Pain Assessment Currently in Pain?: No/denies  Objective: Manual Therapy Manual Therapy: Other (comment) Other Manual Therapy: MLD for R UE routing fluid to L axillary and R inguinal nodes  Measurement of R UE:     Physical Therapy Assessment and Plan PT Assessment and Plan Clinical Impression Statement: Re-measured today. Pt. with less response to MLD this week as compared to previous week. Pt. had a 6.59cc fluid reduction from R UE with an overall 15% reduction in volume since beginning MLD therapy. Pt. met 1/3 STG's and none of her LTG's.  This may be due to pt. undergoing chemotherapy during treatment session.  Pt. is awaiting new compression garment as initial one did not fit properly. Pt. is wearing her old garment until she receives it. PT Plan: Complete last visit then discharge to home maintenance due to lack of response in treatment.    Goals PT Short Term Goals Time to Complete Short Term Goals: 2 weeks PT Short Term Goal 1: Pt pain to be no greater than a 2 PT Short Term Goal 1 - Progress: Met PT Short Term Goal 2: volume to be reduced by 50% PT Short Term Goal 2 - Progress: Not met (15% reduction) PT Short Term Goal 3: Pt to have aquired a long sleeved compression garment. PT Short Term Goal 3 - Progress: Partly met (pt. Re-ordered due to improper fit and has not received yet)  PT Long Term Goals Time to Complete Long Term Goals: 4 weeks PT Long Term Goal 1: Pt  to have no discomfort in her UE PT Long Term Goal 1 - Progress: Partly met  (continues to have heaviness, paresthesias at times) PT Long Term Goal 2: Pt volume to be decreased by 80% PT Long Term Goal 2 - Progress: Not met (15% reduction)  Problem List Patient Active Problem List  Diagnosis  . ACHILLES TENDON TEAR  . Breast CA  . Bone metastases  . UTI (lower urinary tract infection)    PT - End of Session Activity Tolerance: Patient tolerated treatment well General Behavior During Session: [facility] for tasks performed Cognition: [facility] for tasks performed  GP Functional Assessment Tool Used: volumes Functional Limitation: Other PT primary Other PT Primary Current Status (W0981): At least 1 percent but less than 20 percent impaired, limited or restricted Other PT Primary Goal Status (X9147): At least 1 percent but less than 20 percent impaired, limited or restricted Other PT Primary Discharge Status [phone]): At least 1 percent but less than 20 percent impaired, limited or restricted    [name], PTA/CLT 10/14/2012, 5:48 PM  Donnamae Jude PT

## 2012-10-15 ENCOUNTER — Ambulatory Visit (HOSPITAL_COMMUNITY)
Admission: RE | Admit: 2012-10-15 | Discharge: 2012-10-15 | Disposition: A | Discharge status: Home / Self Care | Payer: Medicare Other | Source: Ambulatory Visit | Attending: Family Medicine | Admitting: Family Medicine

## 2012-10-15 NOTE — Progress Notes (Signed)
Physical Therapy Lymphedema Treatment Patient Details  Name: HAIDYN COPLEY MRN: [id number] Date of Birth: 08-Mar-1951  Today's Date: 10/15/2012 Time: 1315-1410 PT Time Calculation (min): 55 min Charges:  Manual 40' Visit#: 11  of 12   Re-eval: 10/16/12 Authorization: medicare   Subjective: Symptoms/Limitations Symptoms: Pt. states her fluid was really moving after yesterday's session.  States she voided alot. Pain Assessment Currently in Pain?: No/denies  Objective: Manual Therapy Manual Therapy: Other (comment) Other Manual Therapy: MLD for R UE routing fluid to L axillary and R inguinal nodes  Physical Therapy Assessment and Plan PT Assessment and Plan Clinical Impression Statement: last treatment today; pt. compliant with wearing sleeve, using compression pump and performing HEP. Please see last treatment note for final volumes and goals met. PT Plan: Discharge to HEP per last session note.     Problem List Patient Active Problem List  Diagnosis  . ACHILLES TENDON TEAR  . Breast CA  . Bone metastases  . UTI (lower urinary tract infection)    PT - End of Session Activity Tolerance: Patient tolerated treatment well General Behavior During Session: [facility] for tasks performed Cognition: [facility] for tasks performed  GP Functional Assessment Tool Used: volumes Functional Limitation: Other PT primary Other PT Primary Current Status (N8295): At least 1 percent but less than 20 percent impaired, limited or restricted Other PT Primary Goal Status (A2130): At least 1 percent but less than 20 percent impaired, limited or restricted Other PT Primary Discharge Status [phone]): At least 1 percent but less than 20 percent impaired, limited or restricted    [name], PTA/CLT 10/15/2012, 2:27 PM

## 2012-10-26 ENCOUNTER — Other Ambulatory Visit (HOSPITAL_COMMUNITY): Payer: Self-pay | Admitting: Oncology

## 2012-10-26 DIAGNOSIS — C50919 Malignant neoplasm of unspecified site of unspecified female breast: Secondary | ICD-10-CM

## 2012-10-26 MED ORDER — VENLAFAXINE HCL 75 MG PO TABS: 75.0000 mg | ORAL_TABLET | Freq: Every day | ORAL | Status: DC

## 2012-11-09 ENCOUNTER — Encounter (HOSPITAL_COMMUNITY): Payer: Medicare Other | Attending: Oncology

## 2012-11-09 VITALS — BP 112/75 | HR 106 | Temp 99.1°F | Resp 16

## 2012-11-09 DIAGNOSIS — C50919 Malignant neoplasm of unspecified site of unspecified female breast: Secondary | ICD-10-CM | POA: Insufficient documentation

## 2012-11-09 DIAGNOSIS — C801 Malignant (primary) neoplasm, unspecified: Secondary | ICD-10-CM | POA: Insufficient documentation

## 2012-11-09 DIAGNOSIS — C7951 Secondary malignant neoplasm of bone: Secondary | ICD-10-CM

## 2012-11-09 DIAGNOSIS — C50519 Malignant neoplasm of lower-outer quadrant of unspecified female breast: Secondary | ICD-10-CM

## 2012-11-09 LAB — CBC WITH DIFFERENTIAL/PLATELET
Basophils Absolute: 0 10*3/uL (ref 0.0–0.1)
HCT: 30.3 % — ABNORMAL LOW (ref 36.0–46.0)
Lymphocytes Relative: 14 % (ref 12–46)
Monocytes Absolute: 1.1 10*3/uL — ABNORMAL HIGH (ref 0.1–1.0)
Neutro Abs: 6.8 10*3/uL (ref 1.7–7.7)
RBC: 3.07 MIL/uL — ABNORMAL LOW (ref 3.87–5.11)
RDW: 14.8 % (ref 11.5–15.5)
WBC: 9.3 10*3/uL (ref 4.0–10.5)

## 2012-11-09 LAB — COMPREHENSIVE METABOLIC PANEL
ALT: 11 U/L (ref 0–35)
AST: 28 U/L (ref 0–37)
CO2: 25 mEq/L (ref 19–32)
Chloride: 105 mEq/L (ref 96–112)
Creatinine, Ser: 0.75 mg/dL (ref 0.50–1.10)
GFR calc non Af Amer: >90 mL/min (ref >90)
Sodium: 137 mEq/L (ref 135–145)
Total Bilirubin: 0.2 mg/dL — ABNORMAL LOW (ref 0.3–1.2)

## 2012-11-09 MED ORDER — ZOLEDRONIC ACID 4 MG/5ML IV CONC
4.0000 mg | Freq: Once | INTRAVENOUS | Status: AC
  Administered 2012-11-09: 4 mg via INTRAVENOUS
  Filled 2012-11-09: qty 5

## 2012-11-09 MED ORDER — SODIUM CHLORIDE 0.9 % IV SOLN
Freq: Once | INTRAVENOUS | Status: AC
  Administered 2012-11-09: 16:00:00 via INTRAVENOUS

## 2012-11-09 MED ORDER — SODIUM CHLORIDE 0.9 % IJ SOLN
INTRAMUSCULAR | Status: AC
  Filled 2012-11-09: qty 10

## 2012-11-09 MED ORDER — HEPARIN SOD (PORK) LOCK FLUSH 100 UNIT/ML IV SOLN
INTRAVENOUS | Status: AC
  Filled 2012-11-09: qty 5

## 2012-11-09 MED ORDER — SODIUM CHLORIDE 0.9 % IJ SOLN
INTRAMUSCULAR | Status: AC
  Filled 2012-11-09: qty 20

## 2012-11-09 MED ORDER — HEPARIN SOD (PORK) LOCK FLUSH 100 UNIT/ML IV SOLN
500.0000 [IU] | Freq: Once | INTRAVENOUS | Status: AC | PRN
  Administered 2012-11-09: 500 [IU]
  Filled 2012-11-09: qty 5

## 2012-11-18 ENCOUNTER — Ambulatory Visit (HOSPITAL_COMMUNITY): Payer: Medicare Other

## 2012-11-30 ENCOUNTER — Telehealth (HOSPITAL_COMMUNITY): Payer: Self-pay | Admitting: *Deleted

## 2012-11-30 ENCOUNTER — Other Ambulatory Visit (HOSPITAL_COMMUNITY): Payer: Self-pay | Admitting: Oncology

## 2012-11-30 DIAGNOSIS — C50919 Malignant neoplasm of unspecified site of unspecified female breast: Secondary | ICD-10-CM

## 2012-11-30 DIAGNOSIS — C7951 Secondary malignant neoplasm of bone: Secondary | ICD-10-CM

## 2012-11-30 MED ORDER — HYDROCODONE-ACETAMINOPHEN 5-325 MG PO TABS: 1.0000 | ORAL_TABLET | ORAL | Status: DC | PRN

## 2012-11-30 NOTE — Telephone Encounter (Signed)
We need to redo her vicodin script. They do not make the 3/500 anymore and we either need to make it for 3/325 or either go to something else for pain.Marland KitchenMarland KitchenMarland Kitchen

## 2012-12-07 ENCOUNTER — Encounter (HOSPITAL_COMMUNITY): Payer: Medicare Other | Attending: Oncology

## 2012-12-07 DIAGNOSIS — C7951 Secondary malignant neoplasm of bone: Secondary | ICD-10-CM | POA: Insufficient documentation

## 2012-12-07 DIAGNOSIS — C50519 Malignant neoplasm of lower-outer quadrant of unspecified female breast: Secondary | ICD-10-CM

## 2012-12-07 DIAGNOSIS — C50919 Malignant neoplasm of unspecified site of unspecified female breast: Secondary | ICD-10-CM | POA: Insufficient documentation

## 2012-12-07 DIAGNOSIS — C801 Malignant (primary) neoplasm, unspecified: Secondary | ICD-10-CM | POA: Insufficient documentation

## 2012-12-07 LAB — CBC WITH DIFFERENTIAL/PLATELET
Basophils Absolute: 0 10*3/uL (ref 0.0–0.1)
Basophils Relative: 1 % (ref 0–1)
Eosinophils Absolute: 0.1 10*3/uL (ref 0.0–0.7)
Eosinophils Relative: 1 % (ref 0–5)
MCH: 30.3 pg (ref 26.0–34.0)
MCHC: 32.2 g/dL (ref 30.0–36.0)
MCV: 93.9 fL (ref 78.0–100.0)
Monocytes Absolute: 0.7 10*3/uL (ref 0.1–1.0)
Platelets: 375 10*3/uL (ref 150–400)
RDW: 15 % (ref 11.5–15.5)
WBC: 8.9 10*3/uL (ref 4.0–10.5)

## 2012-12-07 LAB — COMPREHENSIVE METABOLIC PANEL
ALT: 10 U/L (ref 0–35)
AST: 23 U/L (ref 0–37)
Calcium: 10.3 mg/dL (ref 8.4–10.5)
Creatinine, Ser: 0.8 mg/dL (ref 0.50–1.10)
Sodium: 137 mEq/L (ref 135–145)
Total Protein: 7.5 g/dL (ref 6.0–8.3)

## 2012-12-07 MED ORDER — HEPARIN SOD (PORK) LOCK FLUSH 100 UNIT/ML IV SOLN
INTRAVENOUS | Status: AC
  Filled 2012-12-07: qty 5

## 2012-12-07 MED ORDER — SODIUM CHLORIDE 0.9 % IV SOLN
Freq: Once | INTRAVENOUS | Status: AC
  Administered 2012-12-07: 14:00:00 via INTRAVENOUS

## 2012-12-07 MED ORDER — ZOLEDRONIC ACID 4 MG/5ML IV CONC
4.0000 mg | Freq: Once | INTRAVENOUS | Status: AC
  Administered 2012-12-07: 4 mg via INTRAVENOUS
  Filled 2012-12-07: qty 5

## 2012-12-07 MED ORDER — HEPARIN SOD (PORK) LOCK FLUSH 100 UNIT/ML IV SOLN
500.0000 [IU] | Freq: Once | INTRAVENOUS | Status: AC | PRN
  Administered 2012-12-07: 500 [IU]
  Filled 2012-12-07: qty 5

## 2012-12-08 ENCOUNTER — Encounter (HOSPITAL_BASED_OUTPATIENT_CLINIC_OR_DEPARTMENT_OTHER): Payer: Medicare Other | Admitting: Oncology

## 2012-12-08 VITALS — BP 130/77 | HR 97 | Temp 99.1°F | Resp 18 | Wt 224.8 lb

## 2012-12-08 DIAGNOSIS — C50919 Malignant neoplasm of unspecified site of unspecified female breast: Secondary | ICD-10-CM

## 2012-12-08 DIAGNOSIS — C7951 Secondary malignant neoplasm of bone: Secondary | ICD-10-CM

## 2012-12-08 DIAGNOSIS — Z9010 Acquired absence of unspecified breast and nipple: Secondary | ICD-10-CM

## 2012-12-08 DIAGNOSIS — C50519 Malignant neoplasm of lower-outer quadrant of unspecified female breast: Secondary | ICD-10-CM

## 2012-12-08 NOTE — Progress Notes (Signed)
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 She also had multiple bones involved at that time. We treated her initially with letrozole with a great response, then tamoxifen with a mild response, followed by Faslodex with no response. We then treated her with docetaxel every 3 weeks for 6 doses with a great response but now she is failing. Her cancer marker is gone from 64-103. She has new pain in her hips bilaterally. She has chronic low back pain which is slightly worse but may well be from the disc. It radiates down into her left buttocks and upper thigh. Nevertheless she has more pain in both hips. She has had this pain when she progressed in the past. She had a number of other questions which I have answered hopefully to her satisfaction. I want to document one more cancer marker elevation at this level or greater before initiating chemotherapy. I will initiate Doxil right now. If that fails we can go with Abraxane or go back to docetaxel potentially. We will see what her marker is in 2 weeks before we initiate therapy and I will see her in 6 weeks to see how she is on with therapy.

## 2012-12-08 NOTE — Patient Instructions (Addendum)
[facility] Discharge Instructions  RECOMMENDATIONS MADE BY THE CONSULTANT AND ANY TEST RESULTS WILL BE SENT TO YOUR REFERRING PHYSICIAN.  EXAM FINDINGS BY THE PHYSICIAN TODAY AND SIGNS OR SYMPTOMS TO REPORT TO CLINIC OR PRIMARY PHYSICIAN: Discussion by MD.  Your tumor marker has risen and we need to consider restarting therapy with a new agent.  Want to check your marker in 2 weeks to get a new baseline for possible treatment.  Chemotherapy we would use is Doxil and you would receive it every 28 days.  Will do echocardiogram to make sure your heart function is still ok.  MEDICATIONS PRESCRIBED:  none  INSTRUCTIONS GIVEN AND DISCUSSED: Report any new lumps, bone pain or shortness of breath.  SPECIAL INSTRUCTIONS/FOLLOW-UP: 2 & 4  weeks for blood work, zometa 01/05/13 and to see Dr. Mariel Sleet in 6 weeks.  Thank you for choosing [facility] to provide your oncology and hematology care.  To afford each patient quality time with our providers, please arrive at least 15 minutes before your scheduled appointment time.  With your help, our goal is to use those 15 minutes to complete the necessary work-up to ensure our physicians have the information they need to help with your evaluation and healthcare recommendations.    Effective January 1st, 2014, we ask that you re-schedule your appointment with our physicians should you arrive 10 or more minutes late for your appointment.  We strive to give you quality time with our providers, and arriving late affects you and other patients whose appointments are after yours.    Again, thank you for choosing [facility].  Our hope is that these requests will decrease the amount of time that you wait before being seen by our physicians.       _____________________________________________________________  I acknowledge that I have been informed and understand all the instructions given to me and received a copy. I do  not have anymore questions at this time but understand that I may call the Cancer Center at [facility] at [phone] during business hours should I have any further questions or need assistance in obtaining follow-up care.

## 2012-12-10 ENCOUNTER — Ambulatory Visit (HOSPITAL_COMMUNITY)
Admission: RE | Admit: 2012-12-10 | Discharge: 2012-12-10 | Disposition: A | Discharge status: Home / Self Care | Payer: Medicare Other | Source: Ambulatory Visit | Attending: Oncology | Admitting: Oncology

## 2012-12-10 DIAGNOSIS — C50919 Malignant neoplasm of unspecified site of unspecified female breast: Secondary | ICD-10-CM

## 2012-12-10 DIAGNOSIS — D4989 Neoplasm of unspecified behavior of other specified sites: Secondary | ICD-10-CM

## 2012-12-10 DIAGNOSIS — C7952 Secondary malignant neoplasm of bone marrow: Secondary | ICD-10-CM | POA: Insufficient documentation

## 2012-12-10 DIAGNOSIS — C7951 Secondary malignant neoplasm of bone: Secondary | ICD-10-CM | POA: Insufficient documentation

## 2012-12-10 NOTE — Progress Notes (Signed)
*  PRELIMINARY RESULTS* Echocardiogram 2D Echocardiogram has been performed.  Kristen Parker 12/10/2012, 2:23 PM

## 2012-12-16 ENCOUNTER — Ambulatory Visit (HOSPITAL_COMMUNITY): Payer: Medicare Other

## 2012-12-21 ENCOUNTER — Encounter (HOSPITAL_BASED_OUTPATIENT_CLINIC_OR_DEPARTMENT_OTHER): Payer: Medicare Other

## 2012-12-21 DIAGNOSIS — C50919 Malignant neoplasm of unspecified site of unspecified female breast: Secondary | ICD-10-CM

## 2012-12-21 LAB — CANCER ANTIGEN 27.29: CA 27.29: 118 U/mL — ABNORMAL HIGH (ref 0–39)

## 2012-12-21 NOTE — Progress Notes (Signed)
Labs drawn today for [id number] 

## 2012-12-25 ENCOUNTER — Telehealth (HOSPITAL_COMMUNITY): Payer: Self-pay

## 2012-12-25 NOTE — Telephone Encounter (Signed)
Call from [place] wanting to know if based on her most recent tumor marker does she need to restart chemotherapy and if so what drugs will be prescribed?

## 2012-12-28 ENCOUNTER — Other Ambulatory Visit (HOSPITAL_COMMUNITY): Payer: Self-pay | Admitting: Oncology

## 2012-12-30 ENCOUNTER — Telehealth (HOSPITAL_COMMUNITY): Payer: Self-pay | Admitting: Oncology

## 2012-12-30 NOTE — Telephone Encounter (Signed)
[employer] [phone] CS WAS CLOSED DUE TO INCLEMENT WEATHER. WANTED TO QUESTION IF CPT U9811*[id number] REQUIRE AUTH. WILL CALL BACK

## 2013-01-01 ENCOUNTER — Other Ambulatory Visit (HOSPITAL_COMMUNITY): Payer: Self-pay | Admitting: Oncology

## 2013-01-01 DIAGNOSIS — E876 Hypokalemia: Secondary | ICD-10-CM

## 2013-01-01 MED ORDER — POTASSIUM CHLORIDE CRYS ER 20 MEQ PO TBCR: 20.0000 meq | EXTENDED_RELEASE_TABLET | Freq: Every day | ORAL | Status: DC

## 2013-01-04 ENCOUNTER — Telehealth (HOSPITAL_COMMUNITY): Payer: Self-pay | Admitting: Oncology

## 2013-01-04 NOTE — Patient Instructions (Addendum)
[facility]   CHEMOTHERAPY INSTRUCTIONS  Doxil- myelosuppression (lowers WBCs, RBCs, and platelets), nausea/vomiting, hair loss, mucositis (mouth sores), cardiotoxicity, arrhythmia, hyperuricemia, radiation recall (turn the area red where you previously had radiation), hand foot syndrome (reddened palms of hands/feet, peeling or blisters of hands/feet, painful hands/feet), sensitivity to light, drug may turn urine red or pink. AVOID hot temperatures @ home such as long hot baths/showers, AVOID friction to your hands/feet. Allow plenty of room in your shoes for comfort and so that they do not rub or are constricted.   We are not going to give you Neulasta with this chemotherapy in the beginning. Instead we are going to monitor your CBCs weekly until Cycle II of chemo. We will give you Neulasta if your absolute neutrophil count (a white blood cell) can not return to a normal level for treatment.    POTENTIAL SIDE EFFECTS OF TREATMENT: Increased Susceptibility to Infection, Vomiting, Constipation, Red or Pink Urine (with Adriamycin), Hair Thinning, Changes in Character of Skin and Nails (brittleness, dryness,etc.), Bone Marrow Suppression, Complete Hair Loss, Nausea and Diarrhea   SELF IMAGE NEEDS AND REFERRALS MADE: Obtain hair accessories as soon as possible (wigs, scarves, turbans,caps,etc.)   EDUCATIONAL MATERIALS GIVEN AND REVIEWED: Chemotherapy and You  Specific Instructions Sheets on Doxil   SELF CARE ACTIVITIES WHILE ON CHEMOTHERAPY: Increase your fluid intake 48 hours prior to treatment and drink at least 2 quarts per day after treatment., No alcohol intake., No aspirin or other medications unless approved by your oncologist., Eat foods that are light and easy to digest., Eat foods at cold or room temperature., No fried, fatty, or spicy foods immediately before or after treatment., Have teeth cleaned professionally before starting treatment. Keep  dentures and partial plates clean., Use soft toothbrush and do not use mouthwashes that contain alcohol. Biotene is a good mouthwash that is available at most pharmacies or may be ordered by calling [phone]., Use warm salt water gargles (1 teaspoon salt per 1 quart warm water) before and after meals and at bedtime. Or you may rinse with 2 tablespoons of three -percent hydrogen peroxide mixed in eight ounces of water., Always use sunscreen with SPF (Sun Protection Factor) of 30 or higher., Use your nausea medication as directed to prevent nausea., Use your stool softener or laxative as directed to prevent constipation. and Use your anti-diarrheal medication as directed to stop diarrhea.  Please wash your hands for at least 30 seconds using warm soapy water. Handwashing is the #1 way to prevent the spread of germs. Stay away from sick people or people who are getting over a cold. If you develop respiratory systems such as green/yellow mucus production or productive cough or persistent cough let us know and we will see if you need an antibiotic. It is a good idea to keep a pair of gloves on when going into grocery stores/[employer] to decrease your risk of coming into contact with germs on the carts, etc. Carry alcohol hand gel with you at all times and use it frequently if out in public. All foods need to be cooked thoroughly. No raw foods. No medium or undercooked meats, eggs. If your food is cooked medium well, it does not need to be hot pink or saturated with bloody liquid at all. Vegetables and fruits need to be washed/rinsed under the faucet with a dish detergent before being consumed. You can eat raw fruits and vegetables unless we tell you otherwise but it would be  best if you cooked them or bought frozen. Do not eat off of salad bars or hot bars unless you really trust the cleanliness of the restaurant. If you need dental work, please let Dr. Mariel Sleet know before you go for your appointment so that we  can coordinate the best possible time for you in regards to your chemo regimen. You need to also let your dentist know that you are actively taking chemo. We may need to do labs prior to your dental appointment. We also want your bowels moving at least every other day. If this is not happening, we need to know so that we can get you on a bowel regimen to help you go.     MEDICATIONS: You have been given prescriptions for the following medications:  Dexamethasone 4mg  tablet. Starting the day after chemo, take 2 tabs in the am and 2 tabs in the pm for 2 days. Then Stop. Repeat this with each cycle of chemo. Refer to your calendar.  Zofran/ondansetron 8mg  tablet. Starting the day after chemo, take 1 tab in the am and 1 tab in the pm for 2 days. Then may take 1 tab two times a day IF needed for nausea/vomiting.   Ativan/lorazepam 1mg  tablet. May take 1 tablet every 4 hours IF needed for nausea/vomiting.   Compazine 25mg  suppository. Insert 1 rectally every 6 hours IF needed for nausea/vomiting.   Over-the-Counter Meds:  Colace - this is a stool softener. Take 100mg  capsule 2-6 times a day as needed. If you have to take more than 6 capsules of Colace a day call the Cancer Center.   Senna - this is a mild laxative used to treat mild constipation. May take 2 tabs by mouth daily or up to twice a day as needed for mild constipation.  Milk of Magnesia - this is a laxative used to treat moderate to severe constipation. May take 2-4 tablespoons every 8 hours as needed. May increase to 8 tablespoons x 1 dose and if no bowel movement call the Cancer Center.   Imodium - this is for diarrhea. Take 2 tabs after 1st loose stool and then 1 tab after each loose stool until you go a total of 12 hours without a loose stool. Call Cancer Center if loose stools continue.  SYMPTOMS TO REPORT AS SOON AS POSSIBLE AFTER TREATMENT:  FEVER GREATER THAN 100.5 F  CHILLS WITH OR WITHOUT FEVER  NAUSEA AND VOMITING THAT  IS NOT CONTROLLED WITH YOUR NAUSEA MEDICATION  UNUSUAL SHORTNESS OF BREATH  UNUSUAL BRUISING OR BLEEDING  TENDERNESS IN MOUTH AND THROAT WITH OR WITHOUT PRESENCE OF ULCERS  URINARY PROBLEMS  BOWEL PROBLEMS  UNUSUAL RASH    Wear comfortable clothing and clothing appropriate for easy access to any Portacath or PICC line. Let us know if there is anything that we can do to make your therapy better!      I have been informed and understand all of the instructions given to me and have received a copy. I have been instructed to call the clinic [phone] or my family physician as soon as possible for continued medical care, if indicated. I do not have any more questions at this time but understand that I may call the Cancer Center or the Patient Navigator at [phone] during office hours should I have questions or need assistance in obtaining follow-up care.      _________________________________________      _______________     __________ Signature of Patient  or Authorized Representative        Date                            Time      _________________________________________ [employer]

## 2013-01-04 NOTE — Telephone Encounter (Signed)
[employer] QUESTION IF  CPT [id number] REQUIRE AUTH. PER AKEEM NO AUTH IS REQUIRED REF#[id number]

## 2013-01-05 ENCOUNTER — Encounter (HOSPITAL_COMMUNITY): Payer: Medicare Other

## 2013-01-05 ENCOUNTER — Encounter (HOSPITAL_COMMUNITY): Payer: Medicare Other | Attending: Oncology

## 2013-01-05 ENCOUNTER — Encounter (HOSPITAL_BASED_OUTPATIENT_CLINIC_OR_DEPARTMENT_OTHER): Payer: Medicare Other

## 2013-01-05 VITALS — BP 134/78 | HR 121 | Temp 98.4°F | Resp 18 | Wt 217.6 lb

## 2013-01-05 DIAGNOSIS — C801 Malignant (primary) neoplasm, unspecified: Secondary | ICD-10-CM | POA: Insufficient documentation

## 2013-01-05 DIAGNOSIS — C50919 Malignant neoplasm of unspecified site of unspecified female breast: Secondary | ICD-10-CM | POA: Insufficient documentation

## 2013-01-05 DIAGNOSIS — C7951 Secondary malignant neoplasm of bone: Secondary | ICD-10-CM

## 2013-01-05 DIAGNOSIS — C7952 Secondary malignant neoplasm of bone marrow: Secondary | ICD-10-CM | POA: Insufficient documentation

## 2013-01-05 DIAGNOSIS — Z5111 Encounter for antineoplastic chemotherapy: Secondary | ICD-10-CM

## 2013-01-05 DIAGNOSIS — J209 Acute bronchitis, unspecified: Secondary | ICD-10-CM | POA: Insufficient documentation

## 2013-01-05 DIAGNOSIS — C50519 Malignant neoplasm of lower-outer quadrant of unspecified female breast: Secondary | ICD-10-CM

## 2013-01-05 DIAGNOSIS — D649 Anemia, unspecified: Secondary | ICD-10-CM

## 2013-01-05 LAB — CBC WITH DIFFERENTIAL/PLATELET
Basophils Absolute: 0 10*3/uL (ref 0.0–0.1)
Basophils Relative: 0 % (ref 0–1)
Eosinophils Absolute: 0.1 10*3/uL (ref 0.0–0.7)
Eosinophils Relative: 1 % (ref 0–5)
HCT: 28.7 % — ABNORMAL LOW (ref 36.0–46.0)
Hemoglobin: 9.2 g/dL — ABNORMAL LOW (ref 12.0–15.0)
MCH: 28.8 pg (ref 26.0–34.0)
MCHC: 32.1 g/dL (ref 30.0–36.0)
MCV: 90 fL (ref 78.0–100.0)
Monocytes Absolute: 1 10*3/uL (ref 0.1–1.0)
Monocytes Relative: 9 % (ref 3–12)
RDW: 15.7 % — ABNORMAL HIGH (ref 11.5–15.5)

## 2013-01-05 LAB — FERRITIN: Ferritin: 418 ng/mL — ABNORMAL HIGH (ref 10–291)

## 2013-01-05 LAB — COMPREHENSIVE METABOLIC PANEL
AST: 31 U/L (ref 0–37)
Albumin: 3.2 g/dL — ABNORMAL LOW (ref 3.5–5.2)
BUN: 20 mg/dL (ref 6–23)
Calcium: 9.8 mg/dL (ref 8.4–10.5)
Chloride: 99 mEq/L (ref 96–112)
Creatinine, Ser: 0.93 mg/dL (ref 0.50–1.10)
GFR calc non Af Amer: 65 mL/min — ABNORMAL LOW (ref >90)
Total Bilirubin: 0.2 mg/dL — ABNORMAL LOW (ref 0.3–1.2)

## 2013-01-05 LAB — IRON AND TIBC
Saturation Ratios: 13 % — ABNORMAL LOW (ref 20–55)
TIBC: 262 ug/dL (ref 250–470)
UIBC: 227 ug/dL (ref 125–400)

## 2013-01-05 LAB — CANCER ANTIGEN 27.29: CA 27.29: 135 U/mL — ABNORMAL HIGH (ref 0–39)

## 2013-01-05 MED ORDER — SODIUM CHLORIDE 0.9 % IV SOLN
Freq: Once | INTRAVENOUS | Status: AC
  Administered 2013-01-05: 8 mg via INTRAVENOUS
  Filled 2013-01-05: qty 4

## 2013-01-05 MED ORDER — SODIUM CHLORIDE 0.9 % IJ SOLN
10.0000 mL | INTRAMUSCULAR | Status: DC | PRN
  Administered 2013-01-05: 10 mL
  Filled 2013-01-05: qty 10

## 2013-01-05 MED ORDER — HEPARIN SOD (PORK) LOCK FLUSH 100 UNIT/ML IV SOLN
500.0000 [IU] | Freq: Once | INTRAVENOUS | Status: AC | PRN
  Administered 2013-01-05: 500 [IU]
  Filled 2013-01-05: qty 5

## 2013-01-05 MED ORDER — DEXTROSE 5 % IV SOLN
45.0000 mg/m2 | Freq: Once | INTRAVENOUS | Status: AC
  Administered 2013-01-05: 98 mg via INTRAVENOUS
  Filled 2013-01-05: qty 49

## 2013-01-05 MED ORDER — ZOLEDRONIC ACID 4 MG/5ML IV CONC
4.0000 mg | Freq: Once | INTRAVENOUS | Status: AC
  Administered 2013-01-05: 4 mg via INTRAVENOUS
  Filled 2013-01-05: qty 5

## 2013-01-05 MED ORDER — SODIUM CHLORIDE 0.9 % IV SOLN
Freq: Once | INTRAVENOUS | Status: AC
  Administered 2013-01-05: 09:00:00 via INTRAVENOUS

## 2013-01-05 MED ORDER — HEPARIN SOD (PORK) LOCK FLUSH 100 UNIT/ML IV SOLN
INTRAVENOUS | Status: AC
  Filled 2013-01-05: qty 5

## 2013-01-05 NOTE — Progress Notes (Signed)
Tolerated chemo well today. received zometa as well.

## 2013-01-06 ENCOUNTER — Telehealth (HOSPITAL_COMMUNITY): Payer: Self-pay

## 2013-01-06 NOTE — Telephone Encounter (Signed)
Message copied by Evelena Leyden on Wed Jan 06, 2013 10:06 AM ------      Message from: Mariel Sleet, ERIC S      Created: Wed Jan 06, 2013  9:22 AM       No shortage of iron

## 2013-01-06 NOTE — Telephone Encounter (Signed)
Notified that iron stores are adequate.  Follow-up to chemotherapy - doing well, had some difficult "getting it together this morning, but I've not had any nausea".  Knows to call if she has any problems.  Results of CA 27.29 given to patient at her request.

## 2013-01-12 ENCOUNTER — Other Ambulatory Visit (HOSPITAL_COMMUNITY): Payer: BC Managed Care – HMO

## 2013-01-12 ENCOUNTER — Encounter (HOSPITAL_BASED_OUTPATIENT_CLINIC_OR_DEPARTMENT_OTHER): Payer: Medicare Other

## 2013-01-12 ENCOUNTER — Other Ambulatory Visit (HOSPITAL_COMMUNITY): Payer: Self-pay

## 2013-01-12 ENCOUNTER — Telehealth (HOSPITAL_COMMUNITY): Payer: Self-pay

## 2013-01-12 DIAGNOSIS — C50919 Malignant neoplasm of unspecified site of unspecified female breast: Secondary | ICD-10-CM

## 2013-01-12 DIAGNOSIS — C7951 Secondary malignant neoplasm of bone: Secondary | ICD-10-CM

## 2013-01-12 MED ORDER — SULFAMETHOXAZOLE-TRIMETHOPRIM 800-160 MG PO TABS: 1.0000 | ORAL_TABLET | Freq: Two times a day (BID) | ORAL | Status: DC

## 2013-01-12 MED ORDER — HYDROCOD POLST-CHLORPHEN POLST 10-8 MG/5ML PO LQCR: 5.0000 mL | Freq: Two times a day (BID) | ORAL | Status: DC | PRN

## 2013-01-12 NOTE — Telephone Encounter (Signed)
Call from [place] with complaints of persistent cough,and possible sinus infection.  States "I've not been feeling good since Saturday.  Have coughed so much my ribs hurt.  Don't have any fever but even the skin on top of my head is sore.  Coughing up some greenish looking stuff.  Coming for blood work this morning and will wear a mask.  Just wanted to know if could get something for my cough and sinus infection."  Uses [facility].

## 2013-01-12 NOTE — Progress Notes (Signed)
Labs drawn today for cbc/diff 

## 2013-01-16 ENCOUNTER — Other Ambulatory Visit: Payer: Self-pay

## 2013-01-18 ENCOUNTER — Other Ambulatory Visit (HOSPITAL_COMMUNITY): Payer: Self-pay | Admitting: Oncology

## 2013-01-18 ENCOUNTER — Other Ambulatory Visit (HOSPITAL_COMMUNITY): Payer: Medicare Other

## 2013-01-18 DIAGNOSIS — C50919 Malignant neoplasm of unspecified site of unspecified female breast: Secondary | ICD-10-CM

## 2013-01-18 MED ORDER — FIRST-MARYS MOUTHWASH MT SUSP: 5.0000 mL | Freq: Four times a day (QID) | OROMUCOSAL | Status: DC | PRN

## 2013-01-19 ENCOUNTER — Encounter (HOSPITAL_BASED_OUTPATIENT_CLINIC_OR_DEPARTMENT_OTHER): Payer: Medicare Other | Admitting: Oncology

## 2013-01-19 ENCOUNTER — Encounter (HOSPITAL_COMMUNITY): Payer: Self-pay | Admitting: Oncology

## 2013-01-19 ENCOUNTER — Other Ambulatory Visit (HOSPITAL_COMMUNITY): Payer: BC Managed Care – HMO

## 2013-01-19 ENCOUNTER — Encounter (HOSPITAL_BASED_OUTPATIENT_CLINIC_OR_DEPARTMENT_OTHER): Payer: Medicare Other

## 2013-01-19 VITALS — BP 142/69 | HR 109 | Temp 98.9°F | Resp 20 | Wt 217.0 lb

## 2013-01-19 DIAGNOSIS — C50919 Malignant neoplasm of unspecified site of unspecified female breast: Secondary | ICD-10-CM

## 2013-01-19 DIAGNOSIS — C773 Secondary and unspecified malignant neoplasm of axilla and upper limb lymph nodes: Secondary | ICD-10-CM

## 2013-01-19 DIAGNOSIS — J209 Acute bronchitis, unspecified: Secondary | ICD-10-CM

## 2013-01-19 DIAGNOSIS — C7952 Secondary malignant neoplasm of bone marrow: Secondary | ICD-10-CM

## 2013-01-19 DIAGNOSIS — C50519 Malignant neoplasm of lower-outer quadrant of unspecified female breast: Secondary | ICD-10-CM

## 2013-01-19 LAB — CBC WITH DIFFERENTIAL/PLATELET
Basophils Absolute: 0 10*3/uL (ref 0.0–0.1)
HCT: 31.3 % — ABNORMAL LOW (ref 36.0–46.0)
Lymphocytes Relative: 20 % (ref 12–46)
Lymphs Abs: 1.2 10*3/uL (ref 0.7–4.0)
MCV: 89.7 fL (ref 78.0–100.0)
Monocytes Absolute: 0.2 10*3/uL (ref 0.1–1.0)
Neutro Abs: 4.3 10*3/uL (ref 1.7–7.7)
RBC: 3.49 MIL/uL — ABNORMAL LOW (ref 3.87–5.11)
RDW: 16.9 % — ABNORMAL HIGH (ref 11.5–15.5)
WBC: 5.8 10*3/uL (ref 4.0–10.5)

## 2013-01-19 MED ORDER — HYDROCOD POLST-CHLORPHEN POLST 10-8 MG/5ML PO LQCR: 5.0000 mL | Freq: Two times a day (BID) | ORAL | Status: DC | PRN

## 2013-01-19 NOTE — Progress Notes (Signed)
#  1 metastatic lobular carcinoma of the right breast with multiple lymph nodes involved at the time of presentation in September 2009. 26 of 29 lymph nodes were positive status post right modified radical mastectomy with multiple bones involved at presentation. She was treated initially with letrozole with a great response, then tamoxifen with a mild response, followed by Faslodex with no response. She received docetaxel every 3 weeks for 6 doses with a great response but fairly rapid failure. Her cancer marker has recently risen from 64-103. She is now on Doxil status post 1 dose. She has developed acute bronchitis/sinusitis and has been treated with Septra DS for the last 7 days. She is better. Fever is gone. Green phlegm is less. She is still coughing. The cough syrup helps her sleep at night. She is keeping her bowels active. She has no new pains. The right sided chest discomfort went away with the anti-tussive.  Her lungs are clear today. Right chest wall is clear. Left breast is negative. Port-A-Cath is intact. She has no palpable adenopathy in the cervical, supraclavicular, infraclavicular, axillary, or inguinal areas. Abdomen is obese. She has no obvious hepatosplenomegaly. Bowel sounds are diminished. She has right arm swelling which is not new or different. She has on her sleeve and gauntlet. She has no leg edema or foot edema. Heart shows a regular rhythm and rate. There is no S3 gallop.  She looks good. We'll continue with therapy as planned. I will see her in 4 weeks.

## 2013-01-19 NOTE — Patient Instructions (Addendum)
[facility] Discharge Instructions  RECOMMENDATIONS MADE BY THE CONSULTANT AND ANY TEST RESULTS WILL BE SENT TO YOUR REFERRING PHYSICIAN.  EXAM FINDINGS BY THE PHYSICIAN TODAY AND SIGNS OR SYMPTOMS TO REPORT TO CLINIC OR PRIMARY PHYSICIAN: Exam and discussion by MD.  Call us on Friday and let us know how you are doing.  MEDICATIONS PRESCRIBED:  Refill for Tussionex  INSTRUCTIONS GIVEN AND DISCUSSED: Report fevers, chills, uncontrolled nausea or vomiting.  SPECIAL INSTRUCTIONS/FOLLOW-UP: As scheduled for chemotherapy and to see MD in 4 weeks.  Thank you for choosing [facility] to provide your oncology and hematology care.  To afford each patient quality time with our providers, please arrive at least 15 minutes before your scheduled appointment time.  With your help, our goal is to use those 15 minutes to complete the necessary work-up to ensure our physicians have the information they need to help with your evaluation and healthcare recommendations.    Effective January 1st, 2014, we ask that you re-schedule your appointment with our physicians should you arrive 10 or more minutes late for your appointment.  We strive to give you quality time with our providers, and arriving late affects you and other patients whose appointments are after yours.    Again, thank you for choosing [facility].  Our hope is that these requests will decrease the amount of time that you wait before being seen by our physicians.       _____________________________________________________________  Should you have questions after your visit to [facility], please contact our office at [phone] between the hours of 8:30 a.m. and 5:00 p.m.  Voicemails left after 4:30 p.m. will not be returned until the following business day.  For prescription refill requests, have your pharmacy contact our office with your prescription refill request.

## 2013-01-19 NOTE — Progress Notes (Signed)
Labs drawn today for cbc/diff 

## 2013-01-22 ENCOUNTER — Telehealth (HOSPITAL_COMMUNITY): Payer: Self-pay

## 2013-01-22 NOTE — Telephone Encounter (Signed)
Call from [place], stated "I'm feeling much better and not coughing hardly any at all.  Just wanted to let you know."

## 2013-01-26 ENCOUNTER — Encounter (HOSPITAL_BASED_OUTPATIENT_CLINIC_OR_DEPARTMENT_OTHER): Payer: Medicare Other

## 2013-01-26 DIAGNOSIS — C50519 Malignant neoplasm of lower-outer quadrant of unspecified female breast: Secondary | ICD-10-CM

## 2013-01-26 DIAGNOSIS — C50919 Malignant neoplasm of unspecified site of unspecified female breast: Secondary | ICD-10-CM

## 2013-01-26 DIAGNOSIS — C7952 Secondary malignant neoplasm of bone marrow: Secondary | ICD-10-CM

## 2013-01-26 DIAGNOSIS — C773 Secondary and unspecified malignant neoplasm of axilla and upper limb lymph nodes: Secondary | ICD-10-CM

## 2013-01-26 LAB — CBC WITH DIFFERENTIAL/PLATELET
Eosinophils Relative: 1 % (ref 0–5)
HCT: 34.3 % — ABNORMAL LOW (ref 36.0–46.0)
Hemoglobin: 10.6 g/dL — ABNORMAL LOW (ref 12.0–15.0)
Lymphocytes Relative: 23 % (ref 12–46)
MCHC: 30.9 g/dL (ref 30.0–36.0)
MCV: 91.5 fL (ref 78.0–100.0)
Monocytes Absolute: 0.7 10*3/uL (ref 0.1–1.0)
Monocytes Relative: 16 % — ABNORMAL HIGH (ref 3–12)
Neutro Abs: 2.7 10*3/uL (ref 1.7–7.7)
WBC: 4.5 10*3/uL (ref 4.0–10.5)

## 2013-01-26 NOTE — Progress Notes (Signed)
Labs drawn today for cbc/diff 

## 2013-01-28 ENCOUNTER — Other Ambulatory Visit (HOSPITAL_COMMUNITY): Payer: Self-pay | Admitting: Oncology

## 2013-01-28 DIAGNOSIS — C50919 Malignant neoplasm of unspecified site of unspecified female breast: Secondary | ICD-10-CM

## 2013-01-28 MED ORDER — MEGESTROL ACETATE 40 MG PO TABS: 40.0000 mg | ORAL_TABLET | Freq: Every day | ORAL | Status: DC

## 2013-02-02 ENCOUNTER — Encounter (HOSPITAL_COMMUNITY): Payer: Medicare Other | Attending: Oncology

## 2013-02-02 VITALS — BP 125/78 | HR 112 | Temp 97.3°F | Resp 16 | Wt 217.8 lb

## 2013-02-02 DIAGNOSIS — C50919 Malignant neoplasm of unspecified site of unspecified female breast: Secondary | ICD-10-CM | POA: Insufficient documentation

## 2013-02-02 DIAGNOSIS — Z5111 Encounter for antineoplastic chemotherapy: Secondary | ICD-10-CM

## 2013-02-02 DIAGNOSIS — C50519 Malignant neoplasm of lower-outer quadrant of unspecified female breast: Secondary | ICD-10-CM

## 2013-02-02 LAB — CBC WITH DIFFERENTIAL/PLATELET
Basophils Relative: 1 % (ref 0–1)
Eosinophils Absolute: 0 10*3/uL (ref 0.0–0.7)
Eosinophils Relative: 1 % (ref 0–5)
HCT: 30.4 % — ABNORMAL LOW (ref 36.0–46.0)
Hemoglobin: 9.6 g/dL — ABNORMAL LOW (ref 12.0–15.0)
MCH: 28.7 pg (ref 26.0–34.0)
MCHC: 31.6 g/dL (ref 30.0–36.0)
Monocytes Absolute: 0.7 10*3/uL (ref 0.1–1.0)
Monocytes Relative: 14 % — ABNORMAL HIGH (ref 3–12)
RDW: 19.1 % — ABNORMAL HIGH (ref 11.5–15.5)

## 2013-02-02 LAB — COMPREHENSIVE METABOLIC PANEL
Albumin: 3.6 g/dL (ref 3.5–5.2)
BUN: 15 mg/dL (ref 6–23)
Calcium: 9.1 mg/dL (ref 8.4–10.5)
Chloride: 103 mEq/L (ref 96–112)
Creatinine, Ser: 0.86 mg/dL (ref 0.50–1.10)
Total Bilirubin: 0.2 mg/dL — ABNORMAL LOW (ref 0.3–1.2)
Total Protein: 6.7 g/dL (ref 6.0–8.3)

## 2013-02-02 MED ORDER — HEPARIN SOD (PORK) LOCK FLUSH 100 UNIT/ML IV SOLN
500.0000 [IU] | Freq: Once | INTRAVENOUS | Status: AC | PRN
  Administered 2013-02-02: 500 [IU]
  Filled 2013-02-02: qty 5

## 2013-02-02 MED ORDER — DOXORUBICIN HCL LIPOSOMAL CHEMO INJECTION 2 MG/ML
45.0000 mg/m2 | Freq: Once | INTRAVENOUS | Status: AC
  Administered 2013-02-02: 98 mg via INTRAVENOUS
  Filled 2013-02-02: qty 49

## 2013-02-02 MED ORDER — ZOLEDRONIC ACID 4 MG/5ML IV CONC
4.0000 mg | Freq: Once | INTRAVENOUS | Status: AC
  Administered 2013-02-02: 4 mg via INTRAVENOUS
  Filled 2013-02-02: qty 5

## 2013-02-02 MED ORDER — SODIUM CHLORIDE 0.9 % IV SOLN
Freq: Once | INTRAVENOUS | Status: AC
  Administered 2013-02-02: 10:00:00 via INTRAVENOUS

## 2013-02-02 MED ORDER — SODIUM CHLORIDE 0.9 % IJ SOLN
10.0000 mL | INTRAMUSCULAR | Status: DC | PRN
  Administered 2013-02-02: 10 mL
  Filled 2013-02-02: qty 10

## 2013-02-02 MED ORDER — SODIUM CHLORIDE 0.9 % IV SOLN
Freq: Once | INTRAVENOUS | Status: AC
  Administered 2013-02-02: 8 mg via INTRAVENOUS
  Filled 2013-02-02: qty 4

## 2013-02-02 NOTE — Progress Notes (Signed)
Tolerated chemo well. 

## 2013-02-03 LAB — CANCER ANTIGEN 27.29: CA 27.29: 140 U/mL — ABNORMAL HIGH (ref 0–39)

## 2013-02-15 ENCOUNTER — Encounter: Payer: Self-pay | Admitting: Oncology

## 2013-02-16 ENCOUNTER — Encounter (HOSPITAL_BASED_OUTPATIENT_CLINIC_OR_DEPARTMENT_OTHER): Payer: Medicare Other | Admitting: Oncology

## 2013-02-16 ENCOUNTER — Encounter (HOSPITAL_COMMUNITY): Payer: Self-pay | Admitting: Oncology

## 2013-02-16 VITALS — BP 128/60 | HR 106 | Temp 98.6°F | Resp 16 | Wt 222.0 lb

## 2013-02-16 DIAGNOSIS — L270 Generalized skin eruption due to drugs and medicaments taken internally: Secondary | ICD-10-CM

## 2013-02-16 DIAGNOSIS — C773 Secondary and unspecified malignant neoplasm of axilla and upper limb lymph nodes: Secondary | ICD-10-CM

## 2013-02-16 DIAGNOSIS — C7951 Secondary malignant neoplasm of bone: Secondary | ICD-10-CM

## 2013-02-16 DIAGNOSIS — C50911 Malignant neoplasm of unspecified site of right female breast: Secondary | ICD-10-CM

## 2013-02-16 NOTE — Patient Instructions (Addendum)
[facility] Discharge Instructions  RECOMMENDATIONS MADE BY THE CONSULTANT AND ANY TEST RESULTS WILL BE SENT TO YOUR REFERRING PHYSICIAN.  EXAM FINDINGS BY THE PHYSICIAN TODAY AND SIGNS OR SYMPTOMS TO REPORT TO CLINIC OR PRIMARY PHYSICIAN:  Discussion by MD.  Due to the side effects that you are experiencing we will need to switch therapy.  We will start you on Gemcitabine and navelbine every 14 days.  MEDICATIONS PRESCRIBED:  none  INSTRUCTIONS GIVEN AND DISCUSSED: Report increased pain, shortness of breath or other problems  SPECIAL INSTRUCTIONS/FOLLOW-UP: 4/1 for chemotherapy and to be seen in follow-up during the week of April 21.  Thank you for choosing [facility] to provide your oncology and hematology care.  To afford each patient quality time with our providers, please arrive at least 15 minutes before your scheduled appointment time.  With your help, our goal is to use those 15 minutes to complete the necessary work-up to ensure our physicians have the information they need to help with your evaluation and healthcare recommendations.    Effective January 1st, 2014, we ask that you re-schedule your appointment with our physicians should you arrive 10 or more minutes late for your appointment.  We strive to give you quality time with our providers, and arriving late affects you and other patients whose appointments are after yours.    Again, thank you for choosing [facility].  Our hope is that these requests will decrease the amount of time that you wait before being seen by our physicians.       _____________________________________________________________  Should you have questions after your visit to [facility], please contact our office at [phone] between the hours of 8:30 a.m. and 5:00 p.m.  Voicemails left after 4:30 p.m. will not be returned until the following business day.  For prescription refill requests,  have your pharmacy contact our office with your prescription refill request.

## 2013-02-16 NOTE — Progress Notes (Signed)
Problem #1 metastatic lobular carcinoma of the right breast with multiple positive lymph nodes at the time of presentation in September 2009, with 26 of 29 positive nodes, multiple bone metastases, status post right modified radical mastectomy. She had a great response to letrozole initially, and tamoxifen with a mild response only, followed by no response to Faslodex. She then received docetaxel every 3 weeks for 6 doses with a great response, but rapid failure. 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 She was having less and less pain interestingly and less and less use of pain medication.  She has a negative review of systems otherwise except for constipation at the time of each treatment.  Her hair has regrown she feels good.  Her vital signs are stable. Appetite is too good she states.  She and I and her husband had a long discussion about switching therapy this time to navelbine and gemcitabine. We will see what happens with her tumor marker going forward I will see her after her second dose. We will continue markers monthly, continue Neulasta, see how she fares with this regimen. We went over the side effects in detail.

## 2013-02-26 ENCOUNTER — Other Ambulatory Visit (HOSPITAL_COMMUNITY): Payer: Self-pay | Admitting: Oncology

## 2013-02-26 DIAGNOSIS — C50919 Malignant neoplasm of unspecified site of unspecified female breast: Secondary | ICD-10-CM

## 2013-02-26 MED ORDER — VENLAFAXINE HCL 75 MG PO TABS: 75.0000 mg | ORAL_TABLET | Freq: Every day | ORAL | Status: DC

## 2013-03-01 NOTE — Patient Instructions (Addendum)
[facility]   CHEMOTHERAPY INSTRUCTIONS  Gemcitabine - bone marrow suppression (lowers white blood cells (fight infection), lowers red blood cells (make up your blood), lowers platelets (help blood to clot). Nausea/vomiting,fever, flu-like symptoms, rash.  Navelbine - bone marrow suppression (lowers white blood cells (fight infection), lowers red blood cells (make up your blood), lowers platelets (help blood to clot). Nausea/vomiting, neurotoxicity, peripheral neuropathy (numbness/tingling/burning in hands/fingers/feet/toes),hair loss.    POTENTIAL SIDE EFFECTS OF TREATMENT: Increased Susceptibility to Infection, Vomiting, Constipation, Hair Thinning/Loss, Bone Marrow Suppression, Nausea and Sun Sensitivity, Mouth Sores   EDUCATIONAL MATERIALS GIVEN AND REVIEWED: Specific Instructions Sheets Gemcitabine/Navelbine   SELF CARE ACTIVITIES WHILE ON CHEMOTHERAPY: Increase your fluid intake 48 hours prior to treatment and drink at least 2 quarts per day after treatment., No alcohol intake., No aspirin or other medications unless approved by your oncologist., Eat foods that are light and easy to digest., Eat foods at cold or room temperature., No fried, fatty, or spicy foods immediately before or after treatment., Have teeth cleaned professionally before starting treatment. Keep dentures and partial plates clean., Use soft toothbrush and do not use mouthwashes that contain alcohol. Biotene is a good mouthwash that is available at most pharmacies or may be ordered by calling [phone]., Use warm salt water gargles (1 teaspoon salt per 1 quart warm water) before and after meals and at bedtime. Or you may rinse with 2 tablespoons of three -percent hydrogen peroxide mixed in eight ounces of water., Always use sunscreen with SPF (Sun Protection Factor) of 30 or higher., Use your nausea medication as directed to prevent nausea., Use your stool softener or laxative as  directed to prevent constipation. and Use your anti-diarrheal medication as directed to stop diarrhea.   Please wash your hands for at least 30 seconds using warm soapy water. Handwashing is the #1 way to prevent the spread of germs. Stay away from sick people or people who are getting over a cold. If you develop respiratory systems such as green/yellow mucus production or productive cough or persistent cough let us know and we will see if you need an antibiotic. It is a good idea to keep a pair of gloves on when going into grocery stores/[employer] to decrease your risk of coming into contact with germs on the carts, etc. Carry alcohol hand gel with you at all times and use it frequently if out in public. All foods need to be cooked thoroughly. No raw foods. No medium or undercooked meats, eggs. If your food is cooked medium well, it does not need to be hot pink or saturated with bloody liquid at all. Vegetables and fruits need to be washed/rinsed under the faucet with a dish detergent before being consumed. You can eat raw fruits and vegetables unless we tell you otherwise but it would be best if you cooked them or bought frozen. Do not eat off of salad bars or hot bars unless you really trust the cleanliness of the restaurant. If you need dental work, please let Dr. Mariel Sleet know before you go for your appointment so that we can coordinate the best possible time for you in regards to your chemo regimen. You need to also let your dentist know that you are actively taking chemo. We may need to do labs prior to your dental appointment. We also want your bowels moving at least every other day. If this is not happening, we need to know so that we can get you on  a bowel regimen to help you go.      MEDICATIONS: You have been given prescriptions for the following medications:  Dexamethasone 4mg  tablet. Starting the day after chemo, take 2 tablets in the am and 2 tablets in the pm for 2 days. Then Stop. Repeat  with each chemo cycle.   Zofran/ondansetron 8mg  tablet. Starting the day after chemo, take 1 tablet in the am and 1 tablet in the pm for 2 days. Then may take 1 tablet two times a day IF needed for nausea/vomiting.   Over-the-Counter:   Colace - this is a stool softener. Take 100mg  capsule 2-6 times a day as needed. If you have to take more than 6 capsules of Colace a day call the Cancer Center.  Senna - this is a mild laxative used to treat mild constipation. May take 2 tabs by mouth daily or up to twice a day as needed for mild constipation,   Milk of Magnesia - this is a laxative used to treat moderate to severe constipation. May take 2-4 tablespoons every 8 hours as needed. May increase to 8 tablespoons x 1 dose and if no bowel movement call the Cancer Center.  Imodium - this is for diarrhea. Take 2 tabs after 1st loose stool and then 1 tab after each loose stool until you go a total of 12 hours without a loose stool. Call Cancer Center if loose stools continue.   SYMPTOMS TO REPORT AS SOON AS POSSIBLE AFTER TREATMENT:  FEVER GREATER THAN 100.5 F  CHILLS WITH OR WITHOUT FEVER  NAUSEA AND VOMITING THAT IS NOT CONTROLLED WITH YOUR NAUSEA MEDICATION  UNUSUAL SHORTNESS OF BREATH  UNUSUAL BRUISING OR BLEEDING  TENDERNESS IN MOUTH AND THROAT WITH OR WITHOUT PRESENCE OF ULCERS  URINARY PROBLEMS  BOWEL PROBLEMS  UNUSUAL RASH    Wear comfortable clothing and clothing appropriate for easy access to any Portacath or PICC line. Let us know if there is anything that we can do to make your therapy better!      I have been informed and understand all of the instructions given to me and have received a copy. I have been instructed to call the clinic [phone] or my family physician as soon as possible for continued medical care, if indicated. I do not have any more questions at this time but understand that I may call the Cancer Center or the Patient Navigator at [phone]  during office hours should I have questions or need assistance in obtaining follow-up care.      _________________________________________      _______________     __________ Signature of Patient or Authorized Representative        Date                            Time      _________________________________________ Nurse's Signature

## 2013-03-02 ENCOUNTER — Encounter (HOSPITAL_COMMUNITY): Payer: Medicare Other | Attending: Oncology

## 2013-03-02 VITALS — BP 145/68 | HR 112 | Temp 98.0°F | Resp 18 | Wt 220.8 lb

## 2013-03-02 DIAGNOSIS — C7951 Secondary malignant neoplasm of bone: Secondary | ICD-10-CM | POA: Insufficient documentation

## 2013-03-02 DIAGNOSIS — C50911 Malignant neoplasm of unspecified site of right female breast: Secondary | ICD-10-CM

## 2013-03-02 DIAGNOSIS — C7952 Secondary malignant neoplasm of bone marrow: Secondary | ICD-10-CM | POA: Insufficient documentation

## 2013-03-02 DIAGNOSIS — Z5111 Encounter for antineoplastic chemotherapy: Secondary | ICD-10-CM

## 2013-03-02 DIAGNOSIS — R21 Rash and other nonspecific skin eruption: Secondary | ICD-10-CM | POA: Insufficient documentation

## 2013-03-02 DIAGNOSIS — C50912 Malignant neoplasm of unspecified site of left female breast: Secondary | ICD-10-CM

## 2013-03-02 DIAGNOSIS — C801 Malignant (primary) neoplasm, unspecified: Secondary | ICD-10-CM | POA: Insufficient documentation

## 2013-03-02 DIAGNOSIS — C50519 Malignant neoplasm of lower-outer quadrant of unspecified female breast: Secondary | ICD-10-CM

## 2013-03-02 DIAGNOSIS — C50919 Malignant neoplasm of unspecified site of unspecified female breast: Secondary | ICD-10-CM | POA: Insufficient documentation

## 2013-03-02 LAB — COMPREHENSIVE METABOLIC PANEL
ALT: 9 U/L (ref 0–35)
Albumin: 3.5 g/dL (ref 3.5–5.2)
Alkaline Phosphatase: 127 U/L — ABNORMAL HIGH (ref 39–117)
Calcium: 9.4 mg/dL (ref 8.4–10.5)
Chloride: 102 mEq/L (ref 96–112)
GFR calc Af Amer: 87 mL/min — ABNORMAL LOW (ref >90)
Glucose, Bld: 230 mg/dL — ABNORMAL HIGH (ref 70–99)
Total Protein: 6.6 g/dL (ref 6.0–8.3)

## 2013-03-02 LAB — CBC WITH DIFFERENTIAL/PLATELET
Basophils Absolute: 0 10*3/uL (ref 0.0–0.1)
Eosinophils Relative: 1 % (ref 0–5)
HCT: 30.1 % — ABNORMAL LOW (ref 36.0–46.0)
Hemoglobin: 9.7 g/dL — ABNORMAL LOW (ref 12.0–15.0)
Lymphocytes Relative: 18 % (ref 12–46)
Lymphs Abs: 1.3 10*3/uL (ref 0.7–4.0)
MCV: 92.9 fL (ref 78.0–100.0)
Monocytes Absolute: 0.8 10*3/uL (ref 0.1–1.0)
Neutro Abs: 4.8 10*3/uL (ref 1.7–7.7)
RBC: 3.24 MIL/uL — ABNORMAL LOW (ref 3.87–5.11)
RDW: 20.7 % — ABNORMAL HIGH (ref 11.5–15.5)
WBC: 7 10*3/uL (ref 4.0–10.5)

## 2013-03-02 MED ORDER — SODIUM CHLORIDE 0.9 % IV SOLN
Freq: Once | INTRAVENOUS | Status: AC
  Administered 2013-03-02: 10:00:00 via INTRAVENOUS

## 2013-03-02 MED ORDER — ZOLEDRONIC ACID 4 MG/5ML IV CONC
4.0000 mg | Freq: Once | INTRAVENOUS | Status: AC
  Administered 2013-03-02: 4 mg via INTRAVENOUS
  Filled 2013-03-02: qty 5

## 2013-03-02 MED ORDER — HEPARIN SOD (PORK) LOCK FLUSH 100 UNIT/ML IV SOLN
INTRAVENOUS | Status: AC
  Filled 2013-03-02: qty 5

## 2013-03-02 MED ORDER — HEPARIN SOD (PORK) LOCK FLUSH 100 UNIT/ML IV SOLN
500.0000 [IU] | Freq: Once | INTRAVENOUS | Status: AC | PRN
  Administered 2013-03-02: 500 [IU]
  Filled 2013-03-02: qty 5

## 2013-03-02 MED ORDER — SODIUM CHLORIDE 0.9 % IV SOLN
800.0000 mg/m2 | Freq: Once | INTRAVENOUS | Status: AC
  Administered 2013-03-02: 1710 mg via INTRAVENOUS
  Filled 2013-03-02: qty 45

## 2013-03-02 MED ORDER — VINORELBINE TARTRATE CHEMO INJECTION 50 MG/5ML
30.0000 mg/m2 | Freq: Once | INTRAVENOUS | Status: AC
  Administered 2013-03-02: 65 mg via INTRAVENOUS
  Filled 2013-03-02: qty 6.5

## 2013-03-02 MED ORDER — SODIUM CHLORIDE 0.9 % IV SOLN
Freq: Once | INTRAVENOUS | Status: AC
  Administered 2013-03-02: 8 mg via INTRAVENOUS
  Filled 2013-03-02: qty 4

## 2013-03-02 MED ORDER — SODIUM CHLORIDE 0.9 % IJ SOLN
10.0000 mL | INTRAMUSCULAR | Status: DC | PRN
  Administered 2013-03-02: 10 mL
  Filled 2013-03-02: qty 10

## 2013-03-02 NOTE — Progress Notes (Signed)
Tolerated chemo well.  D/C home accompanied by spouse.

## 2013-03-03 ENCOUNTER — Encounter (HOSPITAL_BASED_OUTPATIENT_CLINIC_OR_DEPARTMENT_OTHER): Payer: Medicare Other

## 2013-03-03 VITALS — BP 143/67 | HR 115

## 2013-03-03 DIAGNOSIS — C773 Secondary and unspecified malignant neoplasm of axilla and upper limb lymph nodes: Secondary | ICD-10-CM

## 2013-03-03 DIAGNOSIS — C50911 Malignant neoplasm of unspecified site of right female breast: Secondary | ICD-10-CM

## 2013-03-03 DIAGNOSIS — C50519 Malignant neoplasm of lower-outer quadrant of unspecified female breast: Secondary | ICD-10-CM

## 2013-03-03 LAB — CANCER ANTIGEN 27.29: CA 27.29: 121 U/mL — ABNORMAL HIGH (ref 0–39)

## 2013-03-03 MED ORDER — PEGFILGRASTIM INJECTION 6 MG/0.6ML
6.0000 mg | Freq: Once | SUBCUTANEOUS | Status: AC
  Administered 2013-03-03: 6 mg via SUBCUTANEOUS

## 2013-03-03 MED ORDER — PEGFILGRASTIM INJECTION 6 MG/0.6ML
SUBCUTANEOUS | Status: AC
  Filled 2013-03-03: qty 0.6

## 2013-03-03 NOTE — Progress Notes (Signed)
Kristen Parker presents today for injection per MD orders. Neulasta 6mg  administered SQ in right Abdomen. Administration without incident. Patient tolerated well.

## 2013-03-08 ENCOUNTER — Encounter (HOSPITAL_BASED_OUTPATIENT_CLINIC_OR_DEPARTMENT_OTHER): Payer: Medicare Other | Admitting: Oncology

## 2013-03-08 VITALS — BP 130/63 | HR 118 | Temp 99.0°F | Resp 18

## 2013-03-08 DIAGNOSIS — R21 Rash and other nonspecific skin eruption: Secondary | ICD-10-CM

## 2013-03-08 MED ORDER — FLUCONAZOLE 100 MG PO TABS: 100.0000 mg | ORAL_TABLET | Freq: Every day | ORAL | Status: DC

## 2013-03-08 NOTE — Progress Notes (Signed)
The patient is a work in today because of the development of a rash over the weekend which is slightly pruritic. It is predominantly in the left axilla left lateral portion of the breast and extending to the left posterior portion of the posterior axillary/chest area. There is no urticarial lesions with a similar rashes on the right axillary area but much smaller area. There appears to be similar changes underneath the neck and on the jaw line. She also has early changes in both inguinal areas though this is minor.  I think this is a tinea infection and will be treated with Diflucan and Lotrisone appears as a Lotrisone at home already she will use at twice a day. We will do the Diflucan for 7 days and she'll call me Friday for analysis. I am hoping this is not from her recent chemotherapy

## 2013-03-08 NOTE — Patient Instructions (Addendum)
[facility] Discharge Instructions  RECOMMENDATIONS MADE BY THE CONSULTANT AND ANY TEST RESULTS WILL BE SENT TO YOUR REFERRING PHYSICIAN.  A prescription for Diflucan has been sent to your pharmacy. You will take the Diflucan for 7 days, please finish all of the medication. Use Lotrisone cream to affected areas two times daily for 7 to 10 days. Let us know by Friday if you are not better. Keep all other appointments as scheduled.  Thank you for choosing [facility] to provide your oncology and hematology care.  To afford each patient quality time with our providers, please arrive at least 15 minutes before your scheduled appointment time.  With your help, our goal is to use those 15 minutes to complete the necessary work-up to ensure our physicians have the information they need to help with your evaluation and healthcare recommendations.    Effective January 1st, 2014, we ask that you re-schedule your appointment with our physicians should you arrive 10 or more minutes late for your appointment.  We strive to give you quality time with our providers, and arriving late affects you and other patients whose appointments are after yours.    Again, thank you for choosing [facility].  Our hope is that these requests will decrease the amount of time that you wait before being seen by our physicians.       _____________________________________________________________  Should you have questions after your visit to [facility], please contact our office at [phone] between the hours of 8:30 a.m. and 5:00 p.m.  Voicemails left after 4:30 p.m. will not be returned until the following business day.  For prescription refill requests, have your pharmacy contact our office with your prescription refill request.

## 2013-03-09 ENCOUNTER — Ambulatory Visit (HOSPITAL_COMMUNITY)
Admission: RE | Admit: 2013-03-09 | Discharge: 2013-03-09 | Disposition: A | Discharge status: Home / Self Care | Payer: Medicare Other | Source: Ambulatory Visit | Attending: Oncology | Admitting: Oncology

## 2013-03-09 DIAGNOSIS — Z5111 Encounter for antineoplastic chemotherapy: Secondary | ICD-10-CM

## 2013-03-09 DIAGNOSIS — D649 Anemia, unspecified: Secondary | ICD-10-CM

## 2013-03-09 DIAGNOSIS — Z6836 Body mass index (BMI) 36.0-36.9, adult: Secondary | ICD-10-CM | POA: Insufficient documentation

## 2013-03-09 DIAGNOSIS — C50919 Malignant neoplasm of unspecified site of unspecified female breast: Secondary | ICD-10-CM | POA: Insufficient documentation

## 2013-03-09 DIAGNOSIS — Z9221 Personal history of antineoplastic chemotherapy: Secondary | ICD-10-CM | POA: Insufficient documentation

## 2013-03-09 NOTE — Progress Notes (Signed)
*  PRELIMINARY RESULTS* Echocardiogram 2D Echocardiogram has been performed.  Kristen Parker 03/09/2013, 11:04 AM

## 2013-03-12 ENCOUNTER — Telehealth (INDEPENDENT_AMBULATORY_CARE_PROVIDER_SITE_OTHER): Payer: Self-pay | Admitting: General Surgery

## 2013-03-12 NOTE — Telephone Encounter (Signed)
Message copied by Liliana Cline on Fri Mar 12, 2013  8:49 AM ------      Message from: Marin Shutter      Created: Fri Mar 12, 2013  8:42 AM      Regarding: Dr. Ruben Im, FYI  - I am doing LTFUs for the month of June.  I called this pt, and she said, "I'm stage 4, they are doing the best they can with the chemo, Dr. Jamey Ripa took out what he could take out, and I'm waiting to die' (or something to that effect).  She stated she didn't feel the need to see him again.  Thx ------

## 2013-03-12 NOTE — Telephone Encounter (Signed)
Dr Jamey Ripa- FYI patient not planning to follow up with [place].

## 2013-03-15 NOTE — Telephone Encounter (Signed)
I agree - she can see me PRN

## 2013-03-16 ENCOUNTER — Encounter (HOSPITAL_BASED_OUTPATIENT_CLINIC_OR_DEPARTMENT_OTHER): Payer: Medicare Other

## 2013-03-16 ENCOUNTER — Other Ambulatory Visit (HOSPITAL_COMMUNITY): Payer: Self-pay

## 2013-03-16 VITALS — BP 146/72 | HR 120 | Temp 99.0°F | Resp 18 | Wt 216.6 lb

## 2013-03-16 DIAGNOSIS — E876 Hypokalemia: Secondary | ICD-10-CM

## 2013-03-16 DIAGNOSIS — C773 Secondary and unspecified malignant neoplasm of axilla and upper limb lymph nodes: Secondary | ICD-10-CM

## 2013-03-16 DIAGNOSIS — C50911 Malignant neoplasm of unspecified site of right female breast: Secondary | ICD-10-CM

## 2013-03-16 DIAGNOSIS — C7951 Secondary malignant neoplasm of bone: Secondary | ICD-10-CM

## 2013-03-16 DIAGNOSIS — C50519 Malignant neoplasm of lower-outer quadrant of unspecified female breast: Secondary | ICD-10-CM

## 2013-03-16 DIAGNOSIS — Z5111 Encounter for antineoplastic chemotherapy: Secondary | ICD-10-CM

## 2013-03-16 LAB — CBC WITH DIFFERENTIAL/PLATELET
Eosinophils Absolute: 0 10*3/uL (ref 0.0–0.7)
Lymphocytes Relative: 10 % — ABNORMAL LOW (ref 12–46)
Lymphs Abs: 1.1 10*3/uL (ref 0.7–4.0)
MCH: 30.1 pg (ref 26.0–34.0)
Neutrophils Relative %: 82 % — ABNORMAL HIGH (ref 43–77)
Platelets: 282 10*3/uL (ref 150–400)
RBC: 3.09 MIL/uL — ABNORMAL LOW (ref 3.87–5.11)
WBC: 11 10*3/uL — ABNORMAL HIGH (ref 4.0–10.5)

## 2013-03-16 LAB — COMPREHENSIVE METABOLIC PANEL
ALT: 12 U/L (ref 0–35)
AST: 18 U/L (ref 0–37)
Alkaline Phosphatase: 113 U/L (ref 39–117)
GFR calc Af Amer: 86 mL/min — ABNORMAL LOW (ref >90)
Glucose, Bld: 190 mg/dL — ABNORMAL HIGH (ref 70–99)
Potassium: 3.2 mEq/L — ABNORMAL LOW (ref 3.5–5.1)
Sodium: 137 mEq/L (ref 135–145)
Total Protein: 6.4 g/dL (ref 6.0–8.3)

## 2013-03-16 MED ORDER — POTASSIUM CHLORIDE CRYS ER 20 MEQ PO TBCR: 20.0000 meq | EXTENDED_RELEASE_TABLET | Freq: Two times a day (BID) | ORAL | Status: AC

## 2013-03-16 MED ORDER — SODIUM CHLORIDE 0.9 % IV SOLN
Freq: Once | INTRAVENOUS | Status: AC
  Administered 2013-03-16: 10:00:00 via INTRAVENOUS

## 2013-03-16 MED ORDER — HEPARIN SOD (PORK) LOCK FLUSH 100 UNIT/ML IV SOLN
500.0000 [IU] | Freq: Once | INTRAVENOUS | Status: AC | PRN
  Administered 2013-03-16: 500 [IU]
  Filled 2013-03-16: qty 5

## 2013-03-16 MED ORDER — SODIUM CHLORIDE 0.9 % IJ SOLN
10.0000 mL | INTRAMUSCULAR | Status: DC | PRN
  Administered 2013-03-16: 10 mL
  Filled 2013-03-16: qty 10

## 2013-03-16 MED ORDER — VINORELBINE TARTRATE CHEMO INJECTION 50 MG/5ML
30.0000 mg/m2 | Freq: Once | INTRAVENOUS | Status: AC
  Administered 2013-03-16: 65 mg via INTRAVENOUS
  Filled 2013-03-16: qty 6.5

## 2013-03-16 MED ORDER — SODIUM CHLORIDE 0.9 % IV SOLN
Freq: Once | INTRAVENOUS | Status: AC
  Administered 2013-03-16: 8 mg via INTRAVENOUS
  Filled 2013-03-16: qty 4

## 2013-03-16 MED ORDER — SODIUM CHLORIDE 0.9 % IV SOLN
800.0000 mg/m2 | Freq: Once | INTRAVENOUS | Status: AC
  Administered 2013-03-16: 1710 mg via INTRAVENOUS
  Filled 2013-03-16: qty 45

## 2013-03-16 MED ORDER — HEPARIN SOD (PORK) LOCK FLUSH 100 UNIT/ML IV SOLN
INTRAVENOUS | Status: AC
  Filled 2013-03-16: qty 5

## 2013-03-16 NOTE — Progress Notes (Signed)
Tolerated chemo well. D/C to home accompanied by spouse.

## 2013-03-17 ENCOUNTER — Encounter (HOSPITAL_BASED_OUTPATIENT_CLINIC_OR_DEPARTMENT_OTHER): Payer: Medicare Other

## 2013-03-17 VITALS — BP 138/93 | HR 113

## 2013-03-17 DIAGNOSIS — C50519 Malignant neoplasm of lower-outer quadrant of unspecified female breast: Secondary | ICD-10-CM

## 2013-03-17 DIAGNOSIS — Z5189 Encounter for other specified aftercare: Secondary | ICD-10-CM

## 2013-03-17 DIAGNOSIS — C50911 Malignant neoplasm of unspecified site of right female breast: Secondary | ICD-10-CM

## 2013-03-17 MED ORDER — PEGFILGRASTIM INJECTION 6 MG/0.6ML
6.0000 mg | Freq: Once | SUBCUTANEOUS | Status: AC
  Administered 2013-03-17: 6 mg via SUBCUTANEOUS

## 2013-03-17 MED ORDER — PEGFILGRASTIM INJECTION 6 MG/0.6ML
SUBCUTANEOUS | Status: AC
  Filled 2013-03-17: qty 0.6

## 2013-03-17 NOTE — Progress Notes (Signed)
Received neulasta 6 mg sub-q in right sub-q abd tissue. Tolerated well.

## 2013-03-18 ENCOUNTER — Ambulatory Visit (HOSPITAL_COMMUNITY): Payer: Medicare Other

## 2013-03-22 ENCOUNTER — Encounter (HOSPITAL_BASED_OUTPATIENT_CLINIC_OR_DEPARTMENT_OTHER): Payer: Medicare Other | Admitting: Oncology

## 2013-03-22 VITALS — BP 136/81 | HR 125 | Temp 98.1°F | Resp 18 | Wt 210.6 lb

## 2013-03-22 DIAGNOSIS — C7951 Secondary malignant neoplasm of bone: Secondary | ICD-10-CM

## 2013-03-22 DIAGNOSIS — C7952 Secondary malignant neoplasm of bone marrow: Secondary | ICD-10-CM

## 2013-03-22 DIAGNOSIS — C779 Secondary and unspecified malignant neoplasm of lymph node, unspecified: Secondary | ICD-10-CM

## 2013-03-22 DIAGNOSIS — M7989 Other specified soft tissue disorders: Secondary | ICD-10-CM

## 2013-03-22 DIAGNOSIS — C50519 Malignant neoplasm of lower-outer quadrant of unspecified female breast: Secondary | ICD-10-CM

## 2013-03-22 DIAGNOSIS — C50911 Malignant neoplasm of unspecified site of right female breast: Secondary | ICD-10-CM

## 2013-03-22 DIAGNOSIS — C50919 Malignant neoplasm of unspecified site of unspecified female breast: Secondary | ICD-10-CM

## 2013-03-22 LAB — COMPREHENSIVE METABOLIC PANEL
ALT: 16 U/L (ref 0–35)
AST: 18 U/L (ref 0–37)
Albumin: 3.6 g/dL (ref 3.5–5.2)
Calcium: 9.7 mg/dL (ref 8.4–10.5)
GFR calc Af Amer: >90 mL/min (ref >90)
Sodium: 135 mEq/L (ref 135–145)
Total Protein: 7.1 g/dL (ref 6.0–8.3)

## 2013-03-22 LAB — CBC WITH DIFFERENTIAL/PLATELET
Basophils Relative: 0 % (ref 0–1)
HCT: 32.1 % — ABNORMAL LOW (ref 36.0–46.0)
Hemoglobin: 10.7 g/dL — ABNORMAL LOW (ref 12.0–15.0)
MCH: 30.2 pg (ref 26.0–34.0)
MCHC: 33.3 g/dL (ref 30.0–36.0)
MCV: 90.7 fL (ref 78.0–100.0)
Monocytes Absolute: 0.2 10*3/uL (ref 0.1–1.0)
Monocytes Relative: 2 % — ABNORMAL LOW (ref 3–12)
Neutro Abs: 9.4 10*3/uL — ABNORMAL HIGH (ref 1.7–7.7)

## 2013-03-22 NOTE — Addendum Note (Signed)
Addended by: Hester Mates A on: 03/22/2013 02:02 PM   Modules accepted: Orders

## 2013-03-22 NOTE — Progress Notes (Signed)
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 She is status post mastectomy and lymph node dissection followed by the initiation for hormonal therapy which has ceased to work. She is now chemotherapy. She responded very well to Taxotere initially. We then switched her to doxorubicin but she developed hand-foot syndrome which was somewhat significant. Just recently switched her to gemcitabine/navelbine  . She is very weak today, pale in appearance, and with no energy. Over the Easter weekend she spent only 2-3 hours out of bed each day.  Her conjunctiva are very pale. Weight is down a few pounds. Her skin however shows tremendous improvement where she had tinea. She finished her Diflucan a week ago today.  She does have leg edema and her throat which she states a sore for 3 days after each treatment is perfectly intact. It also appears moist. There are no ulcerations.  She states that she has trouble swallowing for 3 days as well after the treatment so she will start using the Magic mouthwash 5 times a day for 3 days after each treatment.  I think she may need to be transfused we'll check her blood work today. We'll see what her potassium is that she's also been hypokalemic. We'll see what her cancer marker shows as well.  She did not actively fail to docetaxel but I thought she needed a break from that therapy which is why we switched her to doxil initially. After the hand-foot syndrome however we had to switch therapy. As mentioned she is now on gemcitabine/navelbine.  We'll see her back in 2 weeks and see a she is getting along cc of blood transfusion. I hope she does not need a dose reduction since I dose reduced her already because of her prior chemotherapy exposure

## 2013-03-23 ENCOUNTER — Telehealth (HOSPITAL_COMMUNITY): Payer: Self-pay

## 2013-03-23 LAB — CANCER ANTIGEN 27.29: CA 27.29: 121 U/mL — ABNORMAL HIGH (ref 0–39)

## 2013-03-23 NOTE — Telephone Encounter (Signed)
Patient called regarding lab results 

## 2013-03-25 ENCOUNTER — Other Ambulatory Visit (HOSPITAL_COMMUNITY): Payer: Self-pay | Admitting: Oncology

## 2013-03-25 DIAGNOSIS — K219 Gastro-esophageal reflux disease without esophagitis: Secondary | ICD-10-CM

## 2013-03-25 MED ORDER — OMEPRAZOLE 20 MG PO CPDR: 20.0000 mg | DELAYED_RELEASE_CAPSULE | Freq: Every day | ORAL | Status: AC

## 2013-03-30 ENCOUNTER — Encounter (HOSPITAL_BASED_OUTPATIENT_CLINIC_OR_DEPARTMENT_OTHER): Payer: Medicare Other

## 2013-03-30 VITALS — BP 156/83 | HR 136 | Temp 98.6°F | Resp 18 | Wt 215.8 lb

## 2013-03-30 DIAGNOSIS — Z5111 Encounter for antineoplastic chemotherapy: Secondary | ICD-10-CM

## 2013-03-30 DIAGNOSIS — C50919 Malignant neoplasm of unspecified site of unspecified female breast: Secondary | ICD-10-CM

## 2013-03-30 DIAGNOSIS — C50519 Malignant neoplasm of lower-outer quadrant of unspecified female breast: Secondary | ICD-10-CM

## 2013-03-30 DIAGNOSIS — C7951 Secondary malignant neoplasm of bone: Secondary | ICD-10-CM

## 2013-03-30 DIAGNOSIS — C50911 Malignant neoplasm of unspecified site of right female breast: Secondary | ICD-10-CM

## 2013-03-30 MED ORDER — HEPARIN SOD (PORK) LOCK FLUSH 100 UNIT/ML IV SOLN
500.0000 [IU] | Freq: Once | INTRAVENOUS | Status: AC | PRN
  Administered 2013-03-30: 500 [IU]
  Filled 2013-03-30: qty 5

## 2013-03-30 MED ORDER — SODIUM CHLORIDE 0.9 % IV SOLN
Freq: Once | INTRAVENOUS | Status: AC
  Administered 2013-03-30: 8 mg via INTRAVENOUS
  Filled 2013-03-30: qty 4

## 2013-03-30 MED ORDER — HEPARIN SOD (PORK) LOCK FLUSH 100 UNIT/ML IV SOLN
INTRAVENOUS | Status: AC
  Filled 2013-03-30: qty 5

## 2013-03-30 MED ORDER — SODIUM CHLORIDE 0.9 % IV SOLN
Freq: Once | INTRAVENOUS | Status: AC
  Administered 2013-03-30: 09:00:00 via INTRAVENOUS

## 2013-03-30 MED ORDER — SODIUM CHLORIDE 0.9 % IJ SOLN
10.0000 mL | INTRAMUSCULAR | Status: DC | PRN
  Administered 2013-03-30: 10 mL
  Filled 2013-03-30: qty 10

## 2013-03-30 MED ORDER — VINORELBINE TARTRATE CHEMO INJECTION 50 MG/5ML
30.0000 mg/m2 | Freq: Once | INTRAVENOUS | Status: AC
  Administered 2013-03-30: 65 mg via INTRAVENOUS
  Filled 2013-03-30: qty 6.5

## 2013-03-30 MED ORDER — GEMCITABINE HCL CHEMO INJECTION 1 GM/26.3ML
800.0000 mg/m2 | Freq: Once | INTRAVENOUS | Status: AC
  Administered 2013-03-30: 1710 mg via INTRAVENOUS
  Filled 2013-03-30: qty 45

## 2013-03-30 MED ORDER — ZOLEDRONIC ACID 4 MG/5ML IV CONC
4.0000 mg | Freq: Once | INTRAVENOUS | Status: AC
  Administered 2013-03-30: 4 mg via INTRAVENOUS
  Filled 2013-03-30: qty 5

## 2013-03-30 NOTE — Progress Notes (Signed)
Tolerated chemo well today. 

## 2013-03-31 ENCOUNTER — Encounter (HOSPITAL_BASED_OUTPATIENT_CLINIC_OR_DEPARTMENT_OTHER): Payer: Medicare Other

## 2013-03-31 VITALS — BP 139/61 | HR 115

## 2013-03-31 DIAGNOSIS — C7952 Secondary malignant neoplasm of bone marrow: Secondary | ICD-10-CM

## 2013-03-31 DIAGNOSIS — C50519 Malignant neoplasm of lower-outer quadrant of unspecified female breast: Secondary | ICD-10-CM

## 2013-03-31 DIAGNOSIS — C50911 Malignant neoplasm of unspecified site of right female breast: Secondary | ICD-10-CM

## 2013-03-31 DIAGNOSIS — Z5189 Encounter for other specified aftercare: Secondary | ICD-10-CM

## 2013-03-31 DIAGNOSIS — C7951 Secondary malignant neoplasm of bone: Secondary | ICD-10-CM

## 2013-03-31 MED ORDER — PALONOSETRON HCL INJECTION 0.25 MG/5ML
INTRAVENOUS | Status: AC
  Filled 2013-03-31: qty 5

## 2013-03-31 MED ORDER — PEGFILGRASTIM INJECTION 6 MG/0.6ML
6.0000 mg | Freq: Once | SUBCUTANEOUS | Status: AC
  Administered 2013-03-31: 6 mg via SUBCUTANEOUS

## 2013-03-31 MED ORDER — PEGFILGRASTIM INJECTION 6 MG/0.6ML
SUBCUTANEOUS | Status: AC
  Filled 2013-03-31: qty 0.6

## 2013-03-31 NOTE — Progress Notes (Signed)
Tolerated neulasta 6 mg to lower abd tissue well.

## 2013-04-06 ENCOUNTER — Encounter (HOSPITAL_COMMUNITY): Payer: Self-pay | Admitting: Oncology

## 2013-04-06 ENCOUNTER — Encounter (HOSPITAL_COMMUNITY): Payer: Medicare Other | Attending: Oncology | Admitting: Oncology

## 2013-04-06 VITALS — BP 138/81 | HR 128 | Temp 98.6°F | Resp 18 | Wt 213.3 lb

## 2013-04-06 DIAGNOSIS — C7951 Secondary malignant neoplasm of bone: Secondary | ICD-10-CM | POA: Insufficient documentation

## 2013-04-06 DIAGNOSIS — C801 Malignant (primary) neoplasm, unspecified: Secondary | ICD-10-CM | POA: Insufficient documentation

## 2013-04-06 DIAGNOSIS — C50919 Malignant neoplasm of unspecified site of unspecified female breast: Secondary | ICD-10-CM | POA: Insufficient documentation

## 2013-04-06 DIAGNOSIS — C7952 Secondary malignant neoplasm of bone marrow: Secondary | ICD-10-CM | POA: Insufficient documentation

## 2013-04-06 DIAGNOSIS — D6481 Anemia due to antineoplastic chemotherapy: Secondary | ICD-10-CM | POA: Insufficient documentation

## 2013-04-06 DIAGNOSIS — C50519 Malignant neoplasm of lower-outer quadrant of unspecified female breast: Secondary | ICD-10-CM

## 2013-04-06 NOTE — Progress Notes (Signed)
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 She is status post mastectomy on the right with lymph node dissection followed by the initiation of hormonal therapy. She also received radiation therapy to the right chest wall and axilla. She responded very well to hormonal therapy but eventually failed. She is now on chemotherapy. Responded well to Taxotere but we switched her to do to fatigue and we may he would go back to that drug if need be. Switched her to Doxil but she developed hand-foot syndrome which was quite significant. She is now on gemcitabine and now being. She had her third dose the other day.  She was extremely weak back in April but is doing much better now. She tolerated the chemotherapy well. The Neulasta seems to give her aching in her bones. That lasted for 2 or 3 days.  She also had any infection on the left axilla and chest wall upper abdomen which is now gone. We treated her with Diflucan.  Today she has little puffiness the ankles but no pitting edema.  Her mouth shows no ulcerations her vital signs are stable. Weight is actually up 3 pounds but I don't think this is fluid weight.  Her counts have done very well and she has done well and we'll continue this therapy. She is x-ray having no real bone pain presently which is a major improvement since starting therapy. We'll see what her cancer marker does and re-image her as we go at some point in time.

## 2013-04-06 NOTE — Patient Instructions (Addendum)
[facility] Discharge Instructions  RECOMMENDATIONS MADE BY THE CONSULTANT AND ANY TEST RESULTS WILL BE SENT TO YOUR REFERRING PHYSICIAN.  Keep going with current chemotherapy plan. Report any issues/concerns to clinic as needed. Return in about 6-7 weeks to see Dr.Niejstrom.  Thank you for choosing [facility] to provide your oncology and hematology care.  To afford each patient quality time with our providers, please arrive at least 15 minutes before your scheduled appointment time.  With your help, our goal is to use those 15 minutes to complete the necessary work-up to ensure our physicians have the information they need to help with your evaluation and healthcare recommendations.    Effective January 1st, 2014, we ask that you re-schedule your appointment with our physicians should you arrive 10 or more minutes late for your appointment.  We strive to give you quality time with our providers, and arriving late affects you and other patients whose appointments are after yours.    Again, thank you for choosing [facility].  Our hope is that these requests will decrease the amount of time that you wait before being seen by our physicians.       _____________________________________________________________  Should you have questions after your visit to [facility], please contact our office at [phone] between the hours of 8:30 a.m. and 5:00 p.m.  Voicemails left after 4:30 p.m. will not be returned until the following business day.  For prescription refill requests, have your pharmacy contact our office with your prescription refill request.

## 2013-04-12 ENCOUNTER — Other Ambulatory Visit (HOSPITAL_COMMUNITY): Payer: Self-pay | Admitting: Oncology

## 2013-04-12 DIAGNOSIS — C50919 Malignant neoplasm of unspecified site of unspecified female breast: Secondary | ICD-10-CM

## 2013-04-12 MED ORDER — FIRST-MARYS MOUTHWASH MT SUSP: 5.0000 mL | Freq: Four times a day (QID) | OROMUCOSAL | Status: DC | PRN

## 2013-04-13 ENCOUNTER — Encounter (HOSPITAL_COMMUNITY): Payer: Self-pay

## 2013-04-13 ENCOUNTER — Other Ambulatory Visit (HOSPITAL_COMMUNITY): Payer: Self-pay | Admitting: Oncology

## 2013-04-13 ENCOUNTER — Encounter (HOSPITAL_BASED_OUTPATIENT_CLINIC_OR_DEPARTMENT_OTHER): Payer: Medicare Other

## 2013-04-13 VITALS — BP 128/73 | HR 121 | Temp 98.2°F | Resp 16 | Wt 216.2 lb

## 2013-04-13 DIAGNOSIS — Z5111 Encounter for antineoplastic chemotherapy: Secondary | ICD-10-CM

## 2013-04-13 DIAGNOSIS — C50519 Malignant neoplasm of lower-outer quadrant of unspecified female breast: Secondary | ICD-10-CM

## 2013-04-13 DIAGNOSIS — D6481 Anemia due to antineoplastic chemotherapy: Secondary | ICD-10-CM

## 2013-04-13 DIAGNOSIS — C773 Secondary and unspecified malignant neoplasm of axilla and upper limb lymph nodes: Secondary | ICD-10-CM

## 2013-04-13 DIAGNOSIS — T451X5A Adverse effect of antineoplastic and immunosuppressive drugs, initial encounter: Secondary | ICD-10-CM

## 2013-04-13 DIAGNOSIS — C50911 Malignant neoplasm of unspecified site of right female breast: Secondary | ICD-10-CM

## 2013-04-13 DIAGNOSIS — C7951 Secondary malignant neoplasm of bone: Secondary | ICD-10-CM

## 2013-04-13 HISTORY — DX: Anemia due to antineoplastic chemotherapy: D64.81

## 2013-04-13 HISTORY — DX: Adverse effect of antineoplastic and immunosuppressive drugs, initial encounter: T45.1X5A

## 2013-04-13 LAB — COMPREHENSIVE METABOLIC PANEL
AST: 19 U/L (ref 0–37)
Albumin: 3.5 g/dL (ref 3.5–5.2)
BUN: 19 mg/dL (ref 6–23)
Creatinine, Ser: 0.76 mg/dL (ref 0.50–1.10)
Potassium: 3.9 mEq/L (ref 3.5–5.1)
Total Protein: 6.1 g/dL (ref 6.0–8.3)

## 2013-04-13 LAB — CBC WITH DIFFERENTIAL/PLATELET
Basophils Absolute: 0 10*3/uL (ref 0.0–0.1)
Basophils Relative: 0 % (ref 0–1)
Hemoglobin: 8.9 g/dL — ABNORMAL LOW (ref 12.0–15.0)
Lymphocytes Relative: 11 % — ABNORMAL LOW (ref 12–46)
MCHC: 32.1 g/dL (ref 30.0–36.0)
Monocytes Relative: 5 % (ref 3–12)
Neutro Abs: 12.5 10*3/uL — ABNORMAL HIGH (ref 1.7–7.7)
Neutrophils Relative %: 83 % — ABNORMAL HIGH (ref 43–77)
WBC: 15 10*3/uL — ABNORMAL HIGH (ref 4.0–10.5)

## 2013-04-13 LAB — PREPARE RBC (CROSSMATCH)

## 2013-04-13 MED ORDER — VINORELBINE TARTRATE CHEMO INJECTION 50 MG/5ML
30.0000 mg/m2 | Freq: Once | INTRAVENOUS | Status: AC
  Administered 2013-04-13: 65 mg via INTRAVENOUS
  Filled 2013-04-13: qty 6.5

## 2013-04-13 MED ORDER — SODIUM CHLORIDE 0.9 % IJ SOLN
10.0000 mL | INTRAMUSCULAR | Status: DC | PRN
  Filled 2013-04-13: qty 10

## 2013-04-13 MED ORDER — SODIUM CHLORIDE 0.9 % IV SOLN
800.0000 mg/m2 | Freq: Once | INTRAVENOUS | Status: AC
  Administered 2013-04-13: 1710 mg via INTRAVENOUS
  Filled 2013-04-13: qty 45

## 2013-04-13 MED ORDER — SODIUM CHLORIDE 0.9 % IV SOLN
Freq: Once | INTRAVENOUS | Status: AC
  Administered 2013-04-13: 8 mg via INTRAVENOUS
  Filled 2013-04-13: qty 4

## 2013-04-13 MED ORDER — SODIUM CHLORIDE 0.9 % IV SOLN
Freq: Once | INTRAVENOUS | Status: AC
  Administered 2013-04-13: 09:00:00 via INTRAVENOUS

## 2013-04-13 MED ORDER — HEPARIN SOD (PORK) LOCK FLUSH 100 UNIT/ML IV SOLN
500.0000 [IU] | Freq: Once | INTRAVENOUS | Status: AC | PRN
  Administered 2013-04-13: 500 [IU]
  Filled 2013-04-13: qty 5

## 2013-04-13 MED ORDER — HEPARIN SOD (PORK) LOCK FLUSH 100 UNIT/ML IV SOLN
INTRAVENOUS | Status: AC
  Filled 2013-04-13: qty 5

## 2013-04-13 NOTE — Addendum Note (Signed)
Addended by: Hester Mates A on: 04/13/2013 04:13 PM   Modules accepted: Orders

## 2013-04-14 ENCOUNTER — Encounter (HOSPITAL_BASED_OUTPATIENT_CLINIC_OR_DEPARTMENT_OTHER): Payer: Medicare Other

## 2013-04-14 ENCOUNTER — Encounter (HOSPITAL_COMMUNITY): Payer: Medicare Other

## 2013-04-14 VITALS — BP 113/77 | HR 109 | Temp 98.2°F | Resp 18

## 2013-04-14 DIAGNOSIS — C50519 Malignant neoplasm of lower-outer quadrant of unspecified female breast: Secondary | ICD-10-CM

## 2013-04-14 DIAGNOSIS — T451X5A Adverse effect of antineoplastic and immunosuppressive drugs, initial encounter: Secondary | ICD-10-CM

## 2013-04-14 DIAGNOSIS — C7951 Secondary malignant neoplasm of bone: Secondary | ICD-10-CM

## 2013-04-14 DIAGNOSIS — D649 Anemia, unspecified: Secondary | ICD-10-CM

## 2013-04-14 DIAGNOSIS — C50911 Malignant neoplasm of unspecified site of right female breast: Secondary | ICD-10-CM

## 2013-04-14 MED ORDER — HEPARIN SOD (PORK) LOCK FLUSH 100 UNIT/ML IV SOLN
INTRAVENOUS | Status: AC
  Filled 2013-04-14: qty 5

## 2013-04-14 MED ORDER — DARBEPOETIN ALFA-POLYSORBATE 500 MCG/ML IJ SOLN
INTRAMUSCULAR | Status: AC
  Filled 2013-04-14: qty 1

## 2013-04-14 MED ORDER — HEPARIN SOD (PORK) LOCK FLUSH 100 UNIT/ML IV SOLN
500.0000 [IU] | Freq: Every day | INTRAVENOUS | Status: AC | PRN
  Administered 2013-04-14: 500 [IU]
  Filled 2013-04-14: qty 5

## 2013-04-14 MED ORDER — PEGFILGRASTIM INJECTION 6 MG/0.6ML
SUBCUTANEOUS | Status: AC
  Filled 2013-04-14: qty 0.6

## 2013-04-14 MED ORDER — SODIUM CHLORIDE 0.9 % IV SOLN
250.0000 mL | Freq: Once | INTRAVENOUS | Status: AC
  Administered 2013-04-14: 250 mL via INTRAVENOUS

## 2013-04-14 MED ORDER — PEGFILGRASTIM INJECTION 6 MG/0.6ML
6.0000 mg | Freq: Once | SUBCUTANEOUS | Status: AC
  Administered 2013-04-14: 6 mg via SUBCUTANEOUS

## 2013-04-14 MED ORDER — SODIUM CHLORIDE 0.9 % IJ SOLN
10.0000 mL | INTRAMUSCULAR | Status: DC | PRN
  Filled 2013-04-14: qty 10

## 2013-04-14 MED ORDER — DARBEPOETIN ALFA-POLYSORBATE 500 MCG/ML IJ SOLN
500.0000 ug | Freq: Once | INTRAMUSCULAR | Status: AC
  Administered 2013-04-14: 500 ug via SUBCUTANEOUS

## 2013-04-14 NOTE — Progress Notes (Signed)
Kristen Parker presents today for injection per MD orders. Neulasta 6mg  administered SQ in right Abdomen. Administration without incident. Patient tolerated well. Kristen Parker presents today for injection per MD orders. Aranesp 500 mcg administered SQ in left Abdomen. Administration without incident. Patient tolerated well. Tolerated transfusion well.

## 2013-04-15 LAB — TYPE AND SCREEN
ABO/RH(D): O POS
Antibody Screen: NEGATIVE
Unit division: 0

## 2013-04-23 ENCOUNTER — Other Ambulatory Visit (HOSPITAL_COMMUNITY): Payer: Self-pay | Admitting: Oncology

## 2013-04-23 DIAGNOSIS — C50919 Malignant neoplasm of unspecified site of unspecified female breast: Secondary | ICD-10-CM

## 2013-04-23 MED ORDER — CLONAZEPAM 0.5 MG PO TABS: ORAL_TABLET | ORAL | Status: DC

## 2013-04-27 ENCOUNTER — Encounter (HOSPITAL_BASED_OUTPATIENT_CLINIC_OR_DEPARTMENT_OTHER): Payer: Medicare Other

## 2013-04-27 VITALS — BP 138/85 | HR 123 | Temp 97.9°F | Resp 20 | Wt 216.2 lb

## 2013-04-27 DIAGNOSIS — T451X5A Adverse effect of antineoplastic and immunosuppressive drugs, initial encounter: Secondary | ICD-10-CM

## 2013-04-27 DIAGNOSIS — C50519 Malignant neoplasm of lower-outer quadrant of unspecified female breast: Secondary | ICD-10-CM

## 2013-04-27 DIAGNOSIS — C7952 Secondary malignant neoplasm of bone marrow: Secondary | ICD-10-CM

## 2013-04-27 DIAGNOSIS — Z5111 Encounter for antineoplastic chemotherapy: Secondary | ICD-10-CM

## 2013-04-27 DIAGNOSIS — C50911 Malignant neoplasm of unspecified site of right female breast: Secondary | ICD-10-CM

## 2013-04-27 DIAGNOSIS — C7951 Secondary malignant neoplasm of bone: Secondary | ICD-10-CM

## 2013-04-27 LAB — CBC
MCH: 32.4 pg (ref 26.0–34.0)
MCHC: 33.1 g/dL (ref 30.0–36.0)
MCV: 97.7 fL (ref 78.0–100.0)
Platelets: 249 10*3/uL (ref 150–400)
RDW: 19.3 % — ABNORMAL HIGH (ref 11.5–15.5)

## 2013-04-27 LAB — COMPREHENSIVE METABOLIC PANEL
ALT: 10 U/L (ref 0–35)
AST: 14 U/L (ref 0–37)
CO2: 21 mEq/L (ref 19–32)
Chloride: 108 mEq/L (ref 96–112)
GFR calc non Af Amer: >90 mL/min (ref >90)
Glucose, Bld: 134 mg/dL — ABNORMAL HIGH (ref 70–99)
Sodium: 139 mEq/L (ref 135–145)
Total Bilirubin: 0.2 mg/dL — ABNORMAL LOW (ref 0.3–1.2)

## 2013-04-27 LAB — DIFFERENTIAL
Basophils Absolute: 0 10*3/uL (ref 0.0–0.1)
Lymphocytes Relative: 10 % — ABNORMAL LOW (ref 12–46)
Lymphs Abs: 1.5 10*3/uL (ref 0.7–4.0)
Monocytes Absolute: 1.2 10*3/uL — ABNORMAL HIGH (ref 0.1–1.0)
Neutro Abs: 12.3 10*3/uL — ABNORMAL HIGH (ref 1.7–7.7)

## 2013-04-27 MED ORDER — HEPARIN SOD (PORK) LOCK FLUSH 100 UNIT/ML IV SOLN
500.0000 [IU] | Freq: Once | INTRAVENOUS | Status: AC
  Administered 2013-04-27: 500 [IU] via INTRAVENOUS
  Filled 2013-04-27: qty 5

## 2013-04-27 MED ORDER — ZOLEDRONIC ACID 4 MG/5ML IV CONC
4.0000 mg | Freq: Once | INTRAVENOUS | Status: AC
  Administered 2013-04-27: 4 mg via INTRAVENOUS
  Filled 2013-04-27: qty 5

## 2013-04-27 MED ORDER — HEPARIN SOD (PORK) LOCK FLUSH 100 UNIT/ML IV SOLN
INTRAVENOUS | Status: AC
  Filled 2013-04-27: qty 5

## 2013-04-27 MED ORDER — SODIUM CHLORIDE 0.9 % IV SOLN
Freq: Once | INTRAVENOUS | Status: AC
  Administered 2013-04-27: 11:00:00 via INTRAVENOUS

## 2013-04-27 MED ORDER — SODIUM CHLORIDE 0.9 % IV SOLN
800.0000 mg/m2 | Freq: Once | INTRAVENOUS | Status: AC
  Administered 2013-04-27: 1710 mg via INTRAVENOUS
  Filled 2013-04-27: qty 45

## 2013-04-27 MED ORDER — VINORELBINE TARTRATE CHEMO INJECTION 50 MG/5ML
30.0000 mg/m2 | Freq: Once | INTRAVENOUS | Status: AC
  Administered 2013-04-27: 65 mg via INTRAVENOUS
  Filled 2013-04-27: qty 6.5

## 2013-04-27 MED ORDER — SODIUM CHLORIDE 0.9 % IV SOLN
Freq: Once | INTRAVENOUS | Status: AC
  Administered 2013-04-27: 8 mg via INTRAVENOUS
  Filled 2013-04-27: qty 4

## 2013-04-28 ENCOUNTER — Encounter (HOSPITAL_BASED_OUTPATIENT_CLINIC_OR_DEPARTMENT_OTHER): Payer: Medicare Other

## 2013-04-28 VITALS — BP 135/70 | HR 110

## 2013-04-28 DIAGNOSIS — C50911 Malignant neoplasm of unspecified site of right female breast: Secondary | ICD-10-CM

## 2013-04-28 DIAGNOSIS — C7951 Secondary malignant neoplasm of bone: Secondary | ICD-10-CM

## 2013-04-28 DIAGNOSIS — C50519 Malignant neoplasm of lower-outer quadrant of unspecified female breast: Secondary | ICD-10-CM

## 2013-04-28 MED ORDER — PEGFILGRASTIM INJECTION 6 MG/0.6ML
SUBCUTANEOUS | Status: AC
  Filled 2013-04-28: qty 0.6

## 2013-04-28 MED ORDER — PEGFILGRASTIM INJECTION 6 MG/0.6ML
6.0000 mg | Freq: Once | SUBCUTANEOUS | Status: AC
  Administered 2013-04-28: 6 mg via SUBCUTANEOUS

## 2013-04-28 NOTE — Progress Notes (Signed)
VSS.  Neulasta 6 mg given sub-q to lower right abd.  Tolerated well.

## 2013-05-05 ENCOUNTER — Encounter (HOSPITAL_COMMUNITY): Payer: Medicare Other | Attending: Oncology

## 2013-05-05 ENCOUNTER — Encounter (HOSPITAL_COMMUNITY): Payer: Medicare Other

## 2013-05-05 DIAGNOSIS — C7952 Secondary malignant neoplasm of bone marrow: Secondary | ICD-10-CM | POA: Insufficient documentation

## 2013-05-05 DIAGNOSIS — C50919 Malignant neoplasm of unspecified site of unspecified female breast: Secondary | ICD-10-CM | POA: Insufficient documentation

## 2013-05-05 DIAGNOSIS — T451X5A Adverse effect of antineoplastic and immunosuppressive drugs, initial encounter: Secondary | ICD-10-CM | POA: Insufficient documentation

## 2013-05-05 DIAGNOSIS — D6481 Anemia due to antineoplastic chemotherapy: Secondary | ICD-10-CM | POA: Insufficient documentation

## 2013-05-05 DIAGNOSIS — C7951 Secondary malignant neoplasm of bone: Secondary | ICD-10-CM | POA: Insufficient documentation

## 2013-05-05 DIAGNOSIS — C801 Malignant (primary) neoplasm, unspecified: Secondary | ICD-10-CM | POA: Insufficient documentation

## 2013-05-05 DIAGNOSIS — C50519 Malignant neoplasm of lower-outer quadrant of unspecified female breast: Secondary | ICD-10-CM

## 2013-05-05 LAB — CBC
HCT: 37.2 % (ref 36.0–46.0)
Hemoglobin: 12.3 g/dL (ref 12.0–15.0)
MCV: 97.1 fL (ref 78.0–100.0)
RBC: 3.83 MIL/uL — ABNORMAL LOW (ref 3.87–5.11)
WBC: 21.6 10*3/uL — ABNORMAL HIGH (ref 4.0–10.5)

## 2013-05-05 NOTE — Progress Notes (Signed)
aranesp held

## 2013-05-05 NOTE — Progress Notes (Signed)
Labs drawn today for cbc 

## 2013-05-06 ENCOUNTER — Other Ambulatory Visit (HOSPITAL_COMMUNITY): Payer: Self-pay | Admitting: Oncology

## 2013-05-06 DIAGNOSIS — C50919 Malignant neoplasm of unspecified site of unspecified female breast: Secondary | ICD-10-CM

## 2013-05-06 MED ORDER — MEGESTROL ACETATE 40 MG PO TABS: 40.0000 mg | ORAL_TABLET | Freq: Every day | ORAL | Status: DC

## 2013-05-11 ENCOUNTER — Encounter (HOSPITAL_BASED_OUTPATIENT_CLINIC_OR_DEPARTMENT_OTHER): Payer: Medicare Other

## 2013-05-11 VITALS — BP 137/70 | HR 119 | Temp 98.5°F | Resp 20 | Wt 213.6 lb

## 2013-05-11 DIAGNOSIS — C50911 Malignant neoplasm of unspecified site of right female breast: Secondary | ICD-10-CM

## 2013-05-11 DIAGNOSIS — D6481 Anemia due to antineoplastic chemotherapy: Secondary | ICD-10-CM

## 2013-05-11 DIAGNOSIS — C7952 Secondary malignant neoplasm of bone marrow: Secondary | ICD-10-CM

## 2013-05-11 DIAGNOSIS — C773 Secondary and unspecified malignant neoplasm of axilla and upper limb lymph nodes: Secondary | ICD-10-CM

## 2013-05-11 DIAGNOSIS — C50519 Malignant neoplasm of lower-outer quadrant of unspecified female breast: Secondary | ICD-10-CM

## 2013-05-11 DIAGNOSIS — Z5111 Encounter for antineoplastic chemotherapy: Secondary | ICD-10-CM

## 2013-05-11 DIAGNOSIS — C50919 Malignant neoplasm of unspecified site of unspecified female breast: Secondary | ICD-10-CM

## 2013-05-11 LAB — CBC
HCT: 36.2 % (ref 36.0–46.0)
Hemoglobin: 11.8 g/dL — ABNORMAL LOW (ref 12.0–15.0)
MCH: 32.4 pg (ref 26.0–34.0)
MCHC: 32.6 g/dL (ref 30.0–36.0)
MCV: 99.5 fL (ref 78.0–100.0)
Platelets: 261 10*3/uL (ref 150–400)
RBC: 3.64 MIL/uL — ABNORMAL LOW (ref 3.87–5.11)
RDW: 18.2 % — ABNORMAL HIGH (ref 11.5–15.5)
WBC: 21.9 10*3/uL — ABNORMAL HIGH (ref 4.0–10.5)

## 2013-05-11 LAB — COMPREHENSIVE METABOLIC PANEL
ALT: 13 U/L (ref 0–35)
AST: 17 U/L (ref 0–37)
Albumin: 3.5 g/dL (ref 3.5–5.2)
Alkaline Phosphatase: 135 U/L — ABNORMAL HIGH (ref 39–117)
BUN: 15 mg/dL (ref 6–23)
CO2: 23 mEq/L (ref 19–32)
Calcium: 9.7 mg/dL (ref 8.4–10.5)
Chloride: 106 mEq/L (ref 96–112)
Creatinine, Ser: 0.87 mg/dL (ref 0.50–1.10)
GFR calc Af Amer: 82 mL/min — ABNORMAL LOW (ref >90)
GFR calc non Af Amer: 71 mL/min — ABNORMAL LOW (ref >90)
Glucose, Bld: 157 mg/dL — ABNORMAL HIGH (ref 70–99)
Potassium: 4.4 mEq/L (ref 3.5–5.1)
Sodium: 139 mEq/L (ref 135–145)
Total Bilirubin: 0.2 mg/dL — ABNORMAL LOW (ref 0.3–1.2)
Total Protein: 6.3 g/dL (ref 6.0–8.3)

## 2013-05-11 LAB — DIFFERENTIAL
Basophils Absolute: 0.1 10*3/uL (ref 0.0–0.1)
Basophils Relative: 0 % (ref 0–1)
Neutro Abs: 17.6 10*3/uL — ABNORMAL HIGH (ref 1.7–7.7)
Neutrophils Relative %: 84 % — ABNORMAL HIGH (ref 43–77)

## 2013-05-11 MED ORDER — SODIUM CHLORIDE 0.9 % IV SOLN
Freq: Once | INTRAVENOUS | Status: AC
  Administered 2013-05-11: 11:00:00 via INTRAVENOUS

## 2013-05-11 MED ORDER — HEPARIN SOD (PORK) LOCK FLUSH 100 UNIT/ML IV SOLN
INTRAVENOUS | Status: AC
  Filled 2013-05-11: qty 5

## 2013-05-11 MED ORDER — SODIUM CHLORIDE 0.9 % IV SOLN
800.0000 mg/m2 | Freq: Once | INTRAVENOUS | Status: AC
  Administered 2013-05-11: 1710 mg via INTRAVENOUS
  Filled 2013-05-11: qty 45

## 2013-05-11 MED ORDER — HEPARIN SOD (PORK) LOCK FLUSH 100 UNIT/ML IV SOLN
500.0000 [IU] | Freq: Once | INTRAVENOUS | Status: AC | PRN
  Administered 2013-05-11: 500 [IU]
  Filled 2013-05-11: qty 5

## 2013-05-11 MED ORDER — SODIUM CHLORIDE 0.9 % IV SOLN
30.0000 mg/m2 | Freq: Once | INTRAVENOUS | Status: AC
  Administered 2013-05-11: 65 mg via INTRAVENOUS
  Filled 2013-05-11: qty 6.5

## 2013-05-11 MED ORDER — SODIUM CHLORIDE 0.9 % IV SOLN
Freq: Once | INTRAVENOUS | Status: AC
  Administered 2013-05-11: 8 mg via INTRAVENOUS
  Filled 2013-05-11: qty 4

## 2013-05-12 ENCOUNTER — Encounter: Payer: Self-pay | Admitting: Oncology

## 2013-05-12 ENCOUNTER — Encounter (HOSPITAL_BASED_OUTPATIENT_CLINIC_OR_DEPARTMENT_OTHER): Payer: Medicare Other

## 2013-05-12 VITALS — BP 152/59 | HR 127 | Temp 98.8°F | Resp 20

## 2013-05-12 DIAGNOSIS — C50911 Malignant neoplasm of unspecified site of right female breast: Secondary | ICD-10-CM

## 2013-05-12 DIAGNOSIS — C50519 Malignant neoplasm of lower-outer quadrant of unspecified female breast: Secondary | ICD-10-CM

## 2013-05-12 DIAGNOSIS — C7951 Secondary malignant neoplasm of bone: Secondary | ICD-10-CM

## 2013-05-12 MED ORDER — PEGFILGRASTIM INJECTION 6 MG/0.6ML
6.0000 mg | Freq: Once | SUBCUTANEOUS | Status: AC
  Administered 2013-05-12: 6 mg via SUBCUTANEOUS

## 2013-05-12 MED ORDER — PEGFILGRASTIM INJECTION 6 MG/0.6ML
SUBCUTANEOUS | Status: AC
  Filled 2013-05-12: qty 0.6

## 2013-05-12 NOTE — Progress Notes (Signed)
Kristen Parker presents today for injection per MD orders. Neulasta 6mg  administered SQ in left Abdomen. Administration without incident. Patient tolerated well.

## 2013-05-25 ENCOUNTER — Encounter (HOSPITAL_COMMUNITY): Payer: Medicare Other

## 2013-05-25 ENCOUNTER — Encounter (HOSPITAL_BASED_OUTPATIENT_CLINIC_OR_DEPARTMENT_OTHER): Payer: Medicare Other

## 2013-05-25 VITALS — BP 130/58 | HR 110 | Temp 98.5°F | Resp 16 | Wt 212.8 lb

## 2013-05-25 DIAGNOSIS — C50911 Malignant neoplasm of unspecified site of right female breast: Secondary | ICD-10-CM

## 2013-05-25 DIAGNOSIS — C50519 Malignant neoplasm of lower-outer quadrant of unspecified female breast: Secondary | ICD-10-CM

## 2013-05-25 DIAGNOSIS — C7951 Secondary malignant neoplasm of bone: Secondary | ICD-10-CM

## 2013-05-25 DIAGNOSIS — Z5111 Encounter for antineoplastic chemotherapy: Secondary | ICD-10-CM

## 2013-05-25 DIAGNOSIS — C773 Secondary and unspecified malignant neoplasm of axilla and upper limb lymph nodes: Secondary | ICD-10-CM

## 2013-05-25 DIAGNOSIS — C50919 Malignant neoplasm of unspecified site of unspecified female breast: Secondary | ICD-10-CM

## 2013-05-25 LAB — CBC WITH DIFFERENTIAL/PLATELET
Basophils Relative: 0 % (ref 0–1)
Eosinophils Absolute: 0.3 10*3/uL (ref 0.0–0.7)
Eosinophils Relative: 1 % (ref 0–5)
Hemoglobin: 10.5 g/dL — ABNORMAL LOW (ref 12.0–15.0)
MCH: 33.2 pg (ref 26.0–34.0)
MCHC: 33.7 g/dL (ref 30.0–36.0)
MCV: 98.7 fL (ref 78.0–100.0)
Monocytes Relative: 8 % (ref 3–12)
Neutrophils Relative %: 83 % — ABNORMAL HIGH (ref 43–77)
Platelets: 294 10*3/uL (ref 150–400)

## 2013-05-25 LAB — COMPREHENSIVE METABOLIC PANEL
ALT: 11 U/L (ref 0–35)
Calcium: 10.1 mg/dL (ref 8.4–10.5)
Creatinine, Ser: 0.75 mg/dL (ref 0.50–1.10)
GFR calc Af Amer: >90 mL/min (ref >90)
GFR calc non Af Amer: >90 mL/min (ref >90)
Glucose, Bld: 176 mg/dL — ABNORMAL HIGH (ref 70–99)
Sodium: 138 mEq/L (ref 135–145)
Total Protein: 6.3 g/dL (ref 6.0–8.3)

## 2013-05-25 MED ORDER — VINORELBINE TARTRATE CHEMO INJECTION 50 MG/5ML
30.0000 mg/m2 | Freq: Once | INTRAVENOUS | Status: AC
  Administered 2013-05-25: 65 mg via INTRAVENOUS
  Filled 2013-05-25: qty 6.5

## 2013-05-25 MED ORDER — ZOLEDRONIC ACID 4 MG/5ML IV CONC
4.0000 mg | Freq: Once | INTRAVENOUS | Status: AC
  Administered 2013-05-25: 4 mg via INTRAVENOUS
  Filled 2013-05-25: qty 5

## 2013-05-25 MED ORDER — HEPARIN SOD (PORK) LOCK FLUSH 100 UNIT/ML IV SOLN
500.0000 [IU] | Freq: Once | INTRAVENOUS | Status: AC | PRN
  Administered 2013-05-25: 500 [IU]
  Filled 2013-05-25: qty 5

## 2013-05-25 MED ORDER — SODIUM CHLORIDE 0.9 % IJ SOLN
10.0000 mL | INTRAMUSCULAR | Status: DC | PRN
  Administered 2013-05-25: 10 mL
  Filled 2013-05-25: qty 10

## 2013-05-25 MED ORDER — SODIUM CHLORIDE 0.9 % IV SOLN
Freq: Once | INTRAVENOUS | Status: AC
  Administered 2013-05-25: 10:00:00 via INTRAVENOUS

## 2013-05-25 MED ORDER — SODIUM CHLORIDE 0.9 % IV SOLN
800.0000 mg/m2 | Freq: Once | INTRAVENOUS | Status: AC
  Administered 2013-05-25: 1710 mg via INTRAVENOUS
  Filled 2013-05-25: qty 45

## 2013-05-25 MED ORDER — SODIUM CHLORIDE 0.9 % IV SOLN
Freq: Once | INTRAVENOUS | Status: AC
  Administered 2013-05-25: 8 mg via INTRAVENOUS
  Filled 2013-05-25: qty 4

## 2013-05-25 MED ORDER — HEPARIN SOD (PORK) LOCK FLUSH 100 UNIT/ML IV SOLN
INTRAVENOUS | Status: AC
  Filled 2013-05-25: qty 5

## 2013-05-26 ENCOUNTER — Encounter (HOSPITAL_BASED_OUTPATIENT_CLINIC_OR_DEPARTMENT_OTHER): Payer: Medicare Other

## 2013-05-26 ENCOUNTER — Other Ambulatory Visit (HOSPITAL_COMMUNITY): Payer: Self-pay | Admitting: Oncology

## 2013-05-26 VITALS — BP 141/95

## 2013-05-26 DIAGNOSIS — Z5189 Encounter for other specified aftercare: Secondary | ICD-10-CM

## 2013-05-26 DIAGNOSIS — C50911 Malignant neoplasm of unspecified site of right female breast: Secondary | ICD-10-CM

## 2013-05-26 DIAGNOSIS — C7951 Secondary malignant neoplasm of bone: Secondary | ICD-10-CM

## 2013-05-26 DIAGNOSIS — C50519 Malignant neoplasm of lower-outer quadrant of unspecified female breast: Secondary | ICD-10-CM

## 2013-05-26 DIAGNOSIS — C50919 Malignant neoplasm of unspecified site of unspecified female breast: Secondary | ICD-10-CM

## 2013-05-26 MED ORDER — DEXAMETHASONE 4 MG PO TABS: ORAL_TABLET | ORAL | Status: DC

## 2013-05-26 MED ORDER — PEGFILGRASTIM INJECTION 6 MG/0.6ML
SUBCUTANEOUS | Status: AC
  Filled 2013-05-26: qty 0.6

## 2013-05-26 MED ORDER — PEGFILGRASTIM INJECTION 6 MG/0.6ML
6.0000 mg | Freq: Once | SUBCUTANEOUS | Status: AC
  Administered 2013-05-26: 6 mg via SUBCUTANEOUS

## 2013-05-26 MED ORDER — FIRST-MARYS MOUTHWASH MT SUSP: 5.0000 mL | Freq: Four times a day (QID) | OROMUCOSAL | Status: DC | PRN

## 2013-05-26 NOTE — Progress Notes (Signed)
Neulasta 6 mg given sub-q to lower right abd.  Tolerated well.

## 2013-05-28 ENCOUNTER — Encounter (HOSPITAL_BASED_OUTPATIENT_CLINIC_OR_DEPARTMENT_OTHER): Payer: Medicare Other | Admitting: Oncology

## 2013-05-28 ENCOUNTER — Encounter (HOSPITAL_COMMUNITY): Payer: Self-pay | Admitting: Oncology

## 2013-05-28 VITALS — BP 132/70 | HR 130 | Temp 98.8°F | Resp 20 | Wt 207.8 lb

## 2013-05-28 DIAGNOSIS — C7952 Secondary malignant neoplasm of bone marrow: Secondary | ICD-10-CM

## 2013-05-28 DIAGNOSIS — C50911 Malignant neoplasm of unspecified site of right female breast: Secondary | ICD-10-CM

## 2013-05-28 DIAGNOSIS — C7951 Secondary malignant neoplasm of bone: Secondary | ICD-10-CM

## 2013-05-28 DIAGNOSIS — C50919 Malignant neoplasm of unspecified site of unspecified female breast: Secondary | ICD-10-CM

## 2013-05-28 DIAGNOSIS — D649 Anemia, unspecified: Secondary | ICD-10-CM

## 2013-05-28 NOTE — Progress Notes (Signed)
#  1 Metastatic lobular cancer now on Navelbine and Gem with a nice drop in marker status and stable to decrease in bone pain. Need to continue present therapy and would consider 6 months of therapy as long as she responds still. She is also anemic and did well with transfusion therapy.   BP 132/70  Pulse 130  Temp(Src) 98.8 F (37.1 C) (Oral)  Resp 20  Wt 207 lb 12.8 oz (94.257 kg)  BMI 34.58 kg/m2  She is in NAD  She has no nodes, is tachycardic at 120/ min Lungs are clear to A AND P No M, r, g heard on heart exam No organomegaly detected. Normal to decreased BS No ascites  No leg edema but R arm remains swollen. Port in place but non-functional as to blood withdrawal Alert and oriented  She is clearly responding. Would continue RX as is Spent 25 minutes or more going various issues and questions.

## 2013-05-28 NOTE — Patient Instructions (Addendum)
[facility] Discharge Instructions  RECOMMENDATIONS MADE BY THE CONSULTANT AND ANY TEST RESULTS WILL BE SENT TO YOUR REFERRING PHYSICIAN.  EXAM FINDINGS BY THE PHYSICIAN TODAY AND SIGNS OR SYMPTOMS TO REPORT TO CLINIC OR PRIMARY PHYSICIAN: Exam and discussion by MD.  Bonita Quin are doing well.  Your tumor marker continues to go down.  MD would prefer for you to be treated for at least 6 months as long as you are tolerating it well then if you want or need to take a break from chemotherapy you can.  MEDICATIONS PRESCRIBED:  none  INSTRUCTIONS GIVEN AND DISCUSSED: Report any fevers, chills, infections, uncontrolled nausea, vomiting or pain.  SPECIAL INSTRUCTIONS/FOLLOW-UP: Chemotherapy as scheduled and follow-up in 6 - 8 weeks.  Thank you for choosing [facility] to provide your oncology and hematology care.  To afford each patient quality time with our providers, please arrive at least 15 minutes before your scheduled appointment time.  With your help, our goal is to use those 15 minutes to complete the necessary work-up to ensure our physicians have the information they need to help with your evaluation and healthcare recommendations.    Effective January 1st, 2014, we ask that you re-schedule your appointment with our physicians should you arrive 10 or more minutes late for your appointment.  We strive to give you quality time with our providers, and arriving late affects you and other patients whose appointments are after yours.    Again, thank you for choosing [facility].  Our hope is that these requests will decrease the amount of time that you wait before being seen by our physicians.       _____________________________________________________________  Should you have questions after your visit to [facility], please contact our office at [phone] between the hours of 8:30 a.m. and 5:00 p.m.  Voicemails left after 4:30 p.m. will  not be returned until the following business day.  For prescription refill requests, have your pharmacy contact our office with your prescription refill request.

## 2013-05-31 ENCOUNTER — Other Ambulatory Visit: Payer: Self-pay | Admitting: Obstetrics & Gynecology

## 2013-06-03 ENCOUNTER — Ambulatory Visit (INDEPENDENT_AMBULATORY_CARE_PROVIDER_SITE_OTHER): Payer: Medicare Other | Admitting: Otolaryngology

## 2013-06-03 DIAGNOSIS — K121 Other forms of stomatitis: Secondary | ICD-10-CM

## 2013-06-03 DIAGNOSIS — K1230 Oral mucositis (ulcerative), unspecified: Secondary | ICD-10-CM

## 2013-06-07 ENCOUNTER — Other Ambulatory Visit (HOSPITAL_COMMUNITY): Payer: Self-pay | Admitting: Oncology

## 2013-06-07 DIAGNOSIS — C50919 Malignant neoplasm of unspecified site of unspecified female breast: Secondary | ICD-10-CM

## 2013-06-07 DIAGNOSIS — C7951 Secondary malignant neoplasm of bone: Secondary | ICD-10-CM

## 2013-06-07 MED ORDER — HYDROCODONE-ACETAMINOPHEN 5-325 MG PO TABS: 1.0000 | ORAL_TABLET | ORAL | Status: DC | PRN

## 2013-06-08 ENCOUNTER — Encounter (HOSPITAL_COMMUNITY): Payer: Medicare Other | Attending: Oncology

## 2013-06-08 ENCOUNTER — Ambulatory Visit (HOSPITAL_COMMUNITY): Payer: Medicare Other

## 2013-06-08 VITALS — BP 120/68 | HR 113 | Temp 97.9°F | Resp 18

## 2013-06-08 DIAGNOSIS — Z5111 Encounter for antineoplastic chemotherapy: Secondary | ICD-10-CM

## 2013-06-08 DIAGNOSIS — C50911 Malignant neoplasm of unspecified site of right female breast: Secondary | ICD-10-CM

## 2013-06-08 DIAGNOSIS — T451X5A Adverse effect of antineoplastic and immunosuppressive drugs, initial encounter: Secondary | ICD-10-CM | POA: Insufficient documentation

## 2013-06-08 DIAGNOSIS — C7952 Secondary malignant neoplasm of bone marrow: Secondary | ICD-10-CM

## 2013-06-08 DIAGNOSIS — E860 Dehydration: Secondary | ICD-10-CM | POA: Insufficient documentation

## 2013-06-08 DIAGNOSIS — C801 Malignant (primary) neoplasm, unspecified: Secondary | ICD-10-CM | POA: Insufficient documentation

## 2013-06-08 DIAGNOSIS — C50919 Malignant neoplasm of unspecified site of unspecified female breast: Secondary | ICD-10-CM | POA: Insufficient documentation

## 2013-06-08 DIAGNOSIS — R5383 Other fatigue: Secondary | ICD-10-CM | POA: Insufficient documentation

## 2013-06-08 DIAGNOSIS — D6481 Anemia due to antineoplastic chemotherapy: Secondary | ICD-10-CM | POA: Insufficient documentation

## 2013-06-08 DIAGNOSIS — N390 Urinary tract infection, site not specified: Secondary | ICD-10-CM | POA: Insufficient documentation

## 2013-06-08 DIAGNOSIS — R5381 Other malaise: Secondary | ICD-10-CM | POA: Insufficient documentation

## 2013-06-08 DIAGNOSIS — C7951 Secondary malignant neoplasm of bone: Secondary | ICD-10-CM | POA: Insufficient documentation

## 2013-06-08 LAB — CBC WITH DIFFERENTIAL/PLATELET
Basophils Relative: 1 % (ref 0–1)
HCT: 28.5 % — ABNORMAL LOW (ref 36.0–46.0)
Hemoglobin: 9.2 g/dL — ABNORMAL LOW (ref 12.0–15.0)
Lymphocytes Relative: 5 % — ABNORMAL LOW (ref 12–46)
MCHC: 32.3 g/dL (ref 30.0–36.0)
Monocytes Relative: 7 % (ref 3–12)
Neutro Abs: 25.1 10*3/uL — ABNORMAL HIGH (ref 1.7–7.7)
Neutrophils Relative %: 86 % — ABNORMAL HIGH (ref 43–77)
RBC: 2.89 MIL/uL — ABNORMAL LOW (ref 3.87–5.11)
WBC: 29.3 10*3/uL — ABNORMAL HIGH (ref 4.0–10.5)

## 2013-06-08 LAB — COMPREHENSIVE METABOLIC PANEL
ALT: 10 U/L (ref 0–35)
AST: 17 U/L (ref 0–37)
Alkaline Phosphatase: 103 U/L (ref 39–117)
CO2: 23 mEq/L (ref 19–32)
Calcium: 9.5 mg/dL (ref 8.4–10.5)
GFR calc non Af Amer: >90 mL/min (ref >90)
Potassium: 4.1 mEq/L (ref 3.5–5.1)
Sodium: 137 mEq/L (ref 135–145)
Total Protein: 6.1 g/dL (ref 6.0–8.3)

## 2013-06-08 MED ORDER — HEPARIN SOD (PORK) LOCK FLUSH 100 UNIT/ML IV SOLN
500.0000 [IU] | Freq: Once | INTRAVENOUS | Status: AC | PRN
  Administered 2013-06-08: 500 [IU]
  Filled 2013-06-08: qty 5

## 2013-06-08 MED ORDER — VINORELBINE TARTRATE CHEMO INJECTION 50 MG/5ML
30.0000 mg/m2 | Freq: Once | INTRAVENOUS | Status: AC
  Administered 2013-06-08: 65 mg via INTRAVENOUS
  Filled 2013-06-08: qty 6.5

## 2013-06-08 MED ORDER — SODIUM CHLORIDE 0.9 % IV SOLN
Freq: Once | INTRAVENOUS | Status: AC
  Administered 2013-06-08: 8 mg via INTRAVENOUS
  Filled 2013-06-08: qty 4

## 2013-06-08 MED ORDER — SODIUM CHLORIDE 0.9 % IV SOLN
800.0000 mg/m2 | Freq: Once | INTRAVENOUS | Status: AC
  Administered 2013-06-08: 1710 mg via INTRAVENOUS
  Filled 2013-06-08: qty 45

## 2013-06-08 MED ORDER — SODIUM CHLORIDE 0.9 % IJ SOLN
10.0000 mL | INTRAMUSCULAR | Status: DC | PRN
  Administered 2013-06-08: 10 mL
  Filled 2013-06-08: qty 10

## 2013-06-08 MED ORDER — SODIUM CHLORIDE 0.9 % IV SOLN
Freq: Once | INTRAVENOUS | Status: AC
  Administered 2013-06-08: 11:00:00 via INTRAVENOUS

## 2013-06-08 MED ORDER — HEPARIN SOD (PORK) LOCK FLUSH 100 UNIT/ML IV SOLN
INTRAVENOUS | Status: AC
  Filled 2013-06-08: qty 5

## 2013-06-08 NOTE — Progress Notes (Signed)
Tolerated chemo well. 

## 2013-06-09 ENCOUNTER — Encounter (HOSPITAL_BASED_OUTPATIENT_CLINIC_OR_DEPARTMENT_OTHER): Payer: Medicare Other

## 2013-06-09 VITALS — BP 139/76 | HR 115 | Temp 97.6°F | Resp 20

## 2013-06-09 DIAGNOSIS — Z5111 Encounter for antineoplastic chemotherapy: Secondary | ICD-10-CM

## 2013-06-09 DIAGNOSIS — C7951 Secondary malignant neoplasm of bone: Secondary | ICD-10-CM

## 2013-06-09 DIAGNOSIS — D6481 Anemia due to antineoplastic chemotherapy: Secondary | ICD-10-CM

## 2013-06-09 DIAGNOSIS — C50919 Malignant neoplasm of unspecified site of unspecified female breast: Secondary | ICD-10-CM

## 2013-06-09 DIAGNOSIS — C50911 Malignant neoplasm of unspecified site of right female breast: Secondary | ICD-10-CM

## 2013-06-09 MED ORDER — DARBEPOETIN ALFA-POLYSORBATE 500 MCG/ML IJ SOLN
500.0000 ug | Freq: Once | INTRAMUSCULAR | Status: AC
  Administered 2013-06-09: 500 ug via SUBCUTANEOUS

## 2013-06-09 MED ORDER — PEGFILGRASTIM INJECTION 6 MG/0.6ML
SUBCUTANEOUS | Status: AC
  Filled 2013-06-09: qty 0.6

## 2013-06-09 MED ORDER — PEGFILGRASTIM INJECTION 6 MG/0.6ML
6.0000 mg | Freq: Once | SUBCUTANEOUS | Status: AC
  Administered 2013-06-09: 6 mg via SUBCUTANEOUS

## 2013-06-09 MED ORDER — DARBEPOETIN ALFA-POLYSORBATE 500 MCG/ML IJ SOLN
INTRAMUSCULAR | Status: AC
  Filled 2013-06-09: qty 1

## 2013-06-09 NOTE — Progress Notes (Unsigned)
Consent signed for Aranesp.   Kristen Parker presents today for injection per MD orders. Aranesp  administered SQ in left Abdomen. Administration without incident. Patient tolerated well.   Kristen Parker presents today for injection per MD orders. Neulasta 6mg  administered SQ in right Abdomen. Administration without incident. Patient tolerated well.

## 2013-06-14 ENCOUNTER — Other Ambulatory Visit (HOSPITAL_COMMUNITY): Payer: Self-pay | Admitting: *Deleted

## 2013-06-14 ENCOUNTER — Telehealth (HOSPITAL_COMMUNITY): Payer: Self-pay | Admitting: *Deleted

## 2013-06-14 DIAGNOSIS — C50911 Malignant neoplasm of unspecified site of right female breast: Secondary | ICD-10-CM

## 2013-06-14 NOTE — Telephone Encounter (Signed)
Pt complains of being very weak. She feels like she is going to pass out. Patient states that all she wants to do is lay around and sleep. Mouth continues to be sore. No energy. She feels like her legs are going to give way when she stands up. More short of breath despite having Aranesp last week. Temp of 100.6. Took Tylenol @ 10:30 and no fever since then. No green or yellow mucus production. Drinking her usual amount of water which is a large amount.

## 2013-06-15 ENCOUNTER — Encounter (HOSPITAL_BASED_OUTPATIENT_CLINIC_OR_DEPARTMENT_OTHER): Payer: Medicare Other

## 2013-06-15 ENCOUNTER — Encounter (HOSPITAL_BASED_OUTPATIENT_CLINIC_OR_DEPARTMENT_OTHER): Payer: Medicare Other | Admitting: Oncology

## 2013-06-15 VITALS — BP 128/53 | HR 112

## 2013-06-15 VITALS — BP 102/70 | Temp 98.2°F | Resp 20

## 2013-06-15 DIAGNOSIS — C50919 Malignant neoplasm of unspecified site of unspecified female breast: Secondary | ICD-10-CM

## 2013-06-15 DIAGNOSIS — E860 Dehydration: Secondary | ICD-10-CM

## 2013-06-15 DIAGNOSIS — C50911 Malignant neoplasm of unspecified site of right female breast: Secondary | ICD-10-CM

## 2013-06-15 DIAGNOSIS — R5381 Other malaise: Secondary | ICD-10-CM

## 2013-06-15 DIAGNOSIS — R531 Weakness: Secondary | ICD-10-CM

## 2013-06-15 DIAGNOSIS — R5383 Other fatigue: Secondary | ICD-10-CM

## 2013-06-15 DIAGNOSIS — N390 Urinary tract infection, site not specified: Secondary | ICD-10-CM

## 2013-06-15 LAB — COMPREHENSIVE METABOLIC PANEL
ALT: 17 U/L (ref 0–35)
AST: 15 U/L (ref 0–37)
Albumin: 3.2 g/dL — ABNORMAL LOW (ref 3.5–5.2)
CO2: 21 mEq/L (ref 19–32)
Calcium: 9.3 mg/dL (ref 8.4–10.5)
Chloride: 96 mEq/L (ref 96–112)
GFR calc non Af Amer: 90 mL/min — ABNORMAL LOW (ref >90)
Sodium: 130 mEq/L — ABNORMAL LOW (ref 135–145)

## 2013-06-15 LAB — CBC WITH DIFFERENTIAL/PLATELET
Eosinophils Relative: 3 % (ref 0–5)
HCT: 30.8 % — ABNORMAL LOW (ref 36.0–46.0)
Hemoglobin: 10.3 g/dL — ABNORMAL LOW (ref 12.0–15.0)
Lymphocytes Relative: 4 % — ABNORMAL LOW (ref 12–46)
Lymphs Abs: 0.7 10*3/uL (ref 0.7–4.0)
MCV: 96 fL (ref 78.0–100.0)
Monocytes Absolute: 0.7 10*3/uL (ref 0.1–1.0)
Monocytes Relative: 5 % (ref 3–12)
Neutro Abs: 12.9 10*3/uL — ABNORMAL HIGH (ref 1.7–7.7)
RBC: 3.21 MIL/uL — ABNORMAL LOW (ref 3.87–5.11)
RDW: 16.5 % — ABNORMAL HIGH (ref 11.5–15.5)
WBC: 14.8 10*3/uL — ABNORMAL HIGH (ref 4.0–10.5)

## 2013-06-15 LAB — URINALYSIS, ROUTINE W REFLEX MICROSCOPIC
Glucose, UA: NEGATIVE mg/dL
Specific Gravity, Urine: >1.03 — ABNORMAL HIGH (ref 1.005–1.030)
Urobilinogen, UA: 0.2 mg/dL (ref 0.0–1.0)

## 2013-06-15 LAB — URINE MICROSCOPIC-ADD ON

## 2013-06-15 MED ORDER — SODIUM CHLORIDE 0.9 % IV SOLN
INTRAVENOUS | Status: DC
  Administered 2013-06-15: 11:00:00 via INTRAVENOUS

## 2013-06-15 MED ORDER — DEXAMETHASONE SODIUM PHOSPHATE 10 MG/ML IJ SOLN: 10.0000 mg | Freq: Once | INTRAMUSCULAR | Status: DC

## 2013-06-15 MED ORDER — SULFAMETHOXAZOLE-TRIMETHOPRIM 800-160 MG PO TABS: 1.0000 | ORAL_TABLET | Freq: Two times a day (BID) | ORAL | Status: DC

## 2013-06-15 MED ORDER — SODIUM CHLORIDE 0.9 % IV SOLN
Freq: Once | INTRAVENOUS | Status: AC
  Administered 2013-06-15: 8 mg via INTRAVENOUS
  Filled 2013-06-15: qty 4

## 2013-06-15 MED ORDER — SODIUM CHLORIDE 0.9 % IV SOLN: 8.0000 mg | Freq: Once | INTRAVENOUS | Status: DC

## 2013-06-15 NOTE — Progress Notes (Signed)
Tolerated infusion well. 

## 2013-06-15 NOTE — Progress Notes (Signed)
The patient call the clinic yesterday afternoon and reported severe fatigue and weakness. She was scheduled to come in today for laboratory work and evaluation.  She reported for laboratory work today which shows a hemoglobin of 10.3, white blood cell count 14.8, platelets 193,000, potassium is within normal limits at 3.7, liver enzymes are within normal limits and BNP is 63.5.  EKG was performed which shows a regular rhythm and rate with occasional PACs. Otherwise unremarkable.  UA was also performed which revealed positive nitrites with moderate leukocytes.  She was appreciated to have orthostatic hypotension with a systolic blood pressure dropped on sitting to standing.  CBC    Component Value Date/Time   WBC 14.8* 06/15/2013 0940   RBC 3.21* 06/15/2013 0940   HGB 10.3* 06/15/2013 0940   HCT 30.8* 06/15/2013 0940   PLT 193 06/15/2013 0940   MCV 96.0 06/15/2013 0940   MCH 32.1 06/15/2013 0940   MCHC 33.4 06/15/2013 0940   RDW 16.5* 06/15/2013 0940   LYMPHSABS 0.7 06/15/2013 0940   MONOABS 0.7 06/15/2013 0940   EOSABS 0.5 06/15/2013 0940   BASOSABS 0.0 06/15/2013 0940      Chemistry      Component Value Date/Time   NA 130* 06/15/2013 0940   K 3.7 06/15/2013 0940   CL 96 06/15/2013 0940   CO2 21 06/15/2013 0940   BUN 20 06/15/2013 0940   CREATININE 0.76 06/15/2013 0940      Component Value Date/Time   CALCIUM 9.3 06/15/2013 0940   ALKPHOS 149* 06/15/2013 0940   AST 15 06/15/2013 0940   ALT 17 06/15/2013 0940   BILITOT 0.4 06/15/2013 0940     BNP    Component Value Date/Time   PROBNP 63.5 06/15/2013 0940   Assessment: 1. UTI 2. Dehydration 3. Weakness  4. Fatigue  Plan: 1. I personally reviewed and went over laboratory results with the patient. 2. Reviewed EKG 3. Orders today: CBC diff, CMET, BNP 4. EKG today 5. UA today 6. NS 1 L with 10 of Dexamethasone and 8 mg of IV Zofran 7. Rx for Bactrim x 8 days 9. Return as scheduled  All questions were answered. The patient is call  the clinic with any problems, questions, or concerns.  Patient and plan discussed with Dr. Erline Hau and he is in agreement with the aforementioned.   Kristen Parker

## 2013-06-15 NOTE — Progress Notes (Signed)
Kristen Parker's reason for visit today are for labs as scheduled per MD orders.  Venipuncture performed with a 23 gauge butterfly needle to left hand.  Kristen Parker tolerated venipuncture well and without incident; questions were answered and patient was discharged.

## 2013-06-16 LAB — URINE CULTURE: Colony Count: >=100000

## 2013-06-22 ENCOUNTER — Ambulatory Visit (HOSPITAL_COMMUNITY): Payer: Medicare Other

## 2013-06-22 ENCOUNTER — Encounter (HOSPITAL_BASED_OUTPATIENT_CLINIC_OR_DEPARTMENT_OTHER): Payer: Medicare Other

## 2013-06-22 VITALS — BP 149/56 | HR 121 | Temp 97.3°F | Resp 22 | Wt 207.8 lb

## 2013-06-22 DIAGNOSIS — Z5111 Encounter for antineoplastic chemotherapy: Secondary | ICD-10-CM

## 2013-06-22 DIAGNOSIS — C50911 Malignant neoplasm of unspecified site of right female breast: Secondary | ICD-10-CM

## 2013-06-22 DIAGNOSIS — C7952 Secondary malignant neoplasm of bone marrow: Secondary | ICD-10-CM

## 2013-06-22 DIAGNOSIS — C7951 Secondary malignant neoplasm of bone: Secondary | ICD-10-CM

## 2013-06-22 DIAGNOSIS — C50919 Malignant neoplasm of unspecified site of unspecified female breast: Secondary | ICD-10-CM

## 2013-06-22 DIAGNOSIS — C50519 Malignant neoplasm of lower-outer quadrant of unspecified female breast: Secondary | ICD-10-CM

## 2013-06-22 LAB — CBC WITH DIFFERENTIAL/PLATELET
Basophils Absolute: 0.1 10*3/uL (ref 0.0–0.1)
Basophils Relative: 1 % (ref 0–1)
Eosinophils Absolute: 0.6 10*3/uL (ref 0.0–0.7)
Eosinophils Relative: 2 % (ref 0–5)
HCT: 31.3 % — ABNORMAL LOW (ref 36.0–46.0)
Hemoglobin: 10 g/dL — ABNORMAL LOW (ref 12.0–15.0)
Lymphocytes Relative: 8 % — ABNORMAL LOW (ref 12–46)
Lymphs Abs: 2 10*3/uL (ref 0.7–4.0)
MCH: 32.1 pg (ref 26.0–34.0)
MCHC: 31.9 g/dL (ref 30.0–36.0)
MCV: 100.3 fL — ABNORMAL HIGH (ref 78.0–100.0)
Monocytes Absolute: 1.6 10*3/uL — ABNORMAL HIGH (ref 0.1–1.0)
Monocytes Relative: 7 % (ref 3–12)
Neutro Abs: 19.7 10*3/uL — ABNORMAL HIGH (ref 1.7–7.7)
Neutrophils Relative %: 82 % — ABNORMAL HIGH (ref 43–77)
Platelets: 403 10*3/uL — ABNORMAL HIGH (ref 150–400)
RBC: 3.12 MIL/uL — ABNORMAL LOW (ref 3.87–5.11)
RDW: 18.4 % — ABNORMAL HIGH (ref 11.5–15.5)
WBC: 23.9 10*3/uL — ABNORMAL HIGH (ref 4.0–10.5)

## 2013-06-22 LAB — COMPREHENSIVE METABOLIC PANEL
Albumin: 3.2 g/dL — ABNORMAL LOW (ref 3.5–5.2)
BUN: 17 mg/dL (ref 6–23)
CO2: 18 mEq/L — ABNORMAL LOW (ref 19–32)
Chloride: 104 mEq/L (ref 96–112)
Creatinine, Ser: 0.94 mg/dL (ref 0.50–1.10)
GFR calc non Af Amer: 65 mL/min — ABNORMAL LOW (ref >90)
Total Bilirubin: 0.1 mg/dL — ABNORMAL LOW (ref 0.3–1.2)

## 2013-06-22 LAB — CANCER ANTIGEN 27.29: CA 27.29: 93 U/mL — ABNORMAL HIGH (ref 0–39)

## 2013-06-22 MED ORDER — HEPARIN SOD (PORK) LOCK FLUSH 100 UNIT/ML IV SOLN
INTRAVENOUS | Status: AC
  Filled 2013-06-22: qty 5

## 2013-06-22 MED ORDER — SODIUM CHLORIDE 0.9 % IV SOLN
Freq: Once | INTRAVENOUS | Status: AC
  Administered 2013-06-22: 8 mg via INTRAVENOUS
  Filled 2013-06-22: qty 4

## 2013-06-22 MED ORDER — ZOLEDRONIC ACID 4 MG/5ML IV CONC
4.0000 mg | Freq: Once | INTRAVENOUS | Status: AC
  Administered 2013-06-22: 4 mg via INTRAVENOUS
  Filled 2013-06-22: qty 5

## 2013-06-22 MED ORDER — SODIUM CHLORIDE 0.9 % IV SOLN
Freq: Once | INTRAVENOUS | Status: AC
  Administered 2013-06-22: 12:00:00 via INTRAVENOUS

## 2013-06-22 MED ORDER — HEPARIN SOD (PORK) LOCK FLUSH 100 UNIT/ML IV SOLN
500.0000 [IU] | Freq: Once | INTRAVENOUS | Status: AC | PRN
  Administered 2013-06-22: 500 [IU]
  Filled 2013-06-22: qty 5

## 2013-06-22 MED ORDER — VINORELBINE TARTRATE CHEMO INJECTION 50 MG/5ML
30.0000 mg/m2 | Freq: Once | INTRAVENOUS | Status: AC
  Administered 2013-06-22: 65 mg via INTRAVENOUS
  Filled 2013-06-22: qty 6.5

## 2013-06-22 MED ORDER — SODIUM CHLORIDE 0.9 % IV SOLN
800.0000 mg/m2 | Freq: Once | INTRAVENOUS | Status: AC
  Administered 2013-06-22: 1710 mg via INTRAVENOUS
  Filled 2013-06-22: qty 45

## 2013-06-23 ENCOUNTER — Encounter (HOSPITAL_BASED_OUTPATIENT_CLINIC_OR_DEPARTMENT_OTHER): Payer: Medicare Other

## 2013-06-23 VITALS — BP 121/65 | HR 117

## 2013-06-23 DIAGNOSIS — C50519 Malignant neoplasm of lower-outer quadrant of unspecified female breast: Secondary | ICD-10-CM

## 2013-06-23 DIAGNOSIS — C50911 Malignant neoplasm of unspecified site of right female breast: Secondary | ICD-10-CM

## 2013-06-23 DIAGNOSIS — C50919 Malignant neoplasm of unspecified site of unspecified female breast: Secondary | ICD-10-CM

## 2013-06-23 DIAGNOSIS — Z5189 Encounter for other specified aftercare: Secondary | ICD-10-CM

## 2013-06-23 DIAGNOSIS — C7951 Secondary malignant neoplasm of bone: Secondary | ICD-10-CM

## 2013-06-23 MED ORDER — PEGFILGRASTIM INJECTION 6 MG/0.6ML
6.0000 mg | Freq: Once | SUBCUTANEOUS | Status: AC
  Administered 2013-06-23: 6 mg via SUBCUTANEOUS

## 2013-06-23 MED ORDER — PEGFILGRASTIM INJECTION 6 MG/0.6ML
SUBCUTANEOUS | Status: AC
  Filled 2013-06-23: qty 0.6

## 2013-06-23 NOTE — Progress Notes (Signed)
VSS. Tolerated well Neulasta 6 mg sub-q to lower left abd tissue.

## 2013-06-30 ENCOUNTER — Encounter (HOSPITAL_BASED_OUTPATIENT_CLINIC_OR_DEPARTMENT_OTHER): Payer: Medicare Other

## 2013-06-30 VITALS — BP 124/64 | HR 108

## 2013-06-30 DIAGNOSIS — T451X5A Adverse effect of antineoplastic and immunosuppressive drugs, initial encounter: Secondary | ICD-10-CM

## 2013-06-30 DIAGNOSIS — C50919 Malignant neoplasm of unspecified site of unspecified female breast: Secondary | ICD-10-CM

## 2013-06-30 LAB — CBC
HCT: 31.8 % — ABNORMAL LOW (ref 36.0–46.0)
MCH: 31.5 pg (ref 26.0–34.0)
MCV: 100.3 fL — ABNORMAL HIGH (ref 78.0–100.0)
RBC: 3.17 MIL/uL — ABNORMAL LOW (ref 3.87–5.11)
WBC: 29.1 10*3/uL — ABNORMAL HIGH (ref 4.0–10.5)

## 2013-06-30 MED ORDER — DARBEPOETIN ALFA-POLYSORBATE 100 MCG/0.5ML IJ SOLN
INTRAMUSCULAR | Status: AC
  Filled 2013-06-30: qty 0.5

## 2013-06-30 MED ORDER — DARBEPOETIN ALFA-POLYSORBATE 500 MCG/ML IJ SOLN
INTRAMUSCULAR | Status: AC
  Filled 2013-06-30: qty 1

## 2013-06-30 MED ORDER — DARBEPOETIN ALFA-POLYSORBATE 500 MCG/ML IJ SOLN
500.0000 ug | Freq: Once | INTRAMUSCULAR | Status: AC
  Administered 2013-06-30: 500 ug via SUBCUTANEOUS

## 2013-06-30 NOTE — Progress Notes (Signed)
Labs drawn today for cbc 

## 2013-06-30 NOTE — Progress Notes (Signed)
Aranesp 5oo mcg given sub-q to lower right abd tissue. Tolerated injection well.

## 2013-07-01 ENCOUNTER — Ambulatory Visit (HOSPITAL_COMMUNITY): Payer: Medicare Other | Admitting: Oncology

## 2013-07-01 MED ORDER — VENLAFAXINE HCL 75 MG PO TABS: 75.0000 mg | ORAL_TABLET | Freq: Every day | ORAL | Status: AC

## 2013-07-01 NOTE — Progress Notes (Signed)
This encounter was created in error - please disregard.

## 2013-07-06 ENCOUNTER — Encounter (HOSPITAL_COMMUNITY): Payer: Medicare Other | Attending: Oncology | Admitting: Oncology

## 2013-07-06 ENCOUNTER — Encounter (HOSPITAL_BASED_OUTPATIENT_CLINIC_OR_DEPARTMENT_OTHER): Payer: Medicare Other

## 2013-07-06 VITALS — BP 141/73 | HR 118 | Temp 98.3°F | Resp 22 | Wt 211.0 lb

## 2013-07-06 DIAGNOSIS — N61 Mastitis without abscess: Secondary | ICD-10-CM

## 2013-07-06 DIAGNOSIS — C50919 Malignant neoplasm of unspecified site of unspecified female breast: Secondary | ICD-10-CM

## 2013-07-06 DIAGNOSIS — C7952 Secondary malignant neoplasm of bone marrow: Secondary | ICD-10-CM | POA: Insufficient documentation

## 2013-07-06 DIAGNOSIS — L539 Erythematous condition, unspecified: Secondary | ICD-10-CM

## 2013-07-06 DIAGNOSIS — C801 Malignant (primary) neoplasm, unspecified: Secondary | ICD-10-CM | POA: Insufficient documentation

## 2013-07-06 DIAGNOSIS — C50519 Malignant neoplasm of lower-outer quadrant of unspecified female breast: Secondary | ICD-10-CM

## 2013-07-06 DIAGNOSIS — C7951 Secondary malignant neoplasm of bone: Secondary | ICD-10-CM

## 2013-07-06 DIAGNOSIS — T451X5A Adverse effect of antineoplastic and immunosuppressive drugs, initial encounter: Secondary | ICD-10-CM | POA: Insufficient documentation

## 2013-07-06 DIAGNOSIS — Z5111 Encounter for antineoplastic chemotherapy: Secondary | ICD-10-CM

## 2013-07-06 DIAGNOSIS — C50911 Malignant neoplasm of unspecified site of right female breast: Secondary | ICD-10-CM

## 2013-07-06 DIAGNOSIS — D6481 Anemia due to antineoplastic chemotherapy: Secondary | ICD-10-CM | POA: Insufficient documentation

## 2013-07-06 LAB — CBC WITH DIFFERENTIAL/PLATELET
Basophils Relative: 0 % (ref 0–1)
Eosinophils Absolute: 0.1 10*3/uL (ref 0.0–0.7)
Eosinophils Relative: 0 % (ref 0–5)
HCT: 29.2 % — ABNORMAL LOW (ref 36.0–46.0)
Hemoglobin: 9.5 g/dL — ABNORMAL LOW (ref 12.0–15.0)
MCH: 33.3 pg (ref 26.0–34.0)
MCHC: 32.5 g/dL (ref 30.0–36.0)
Monocytes Absolute: 1.6 10*3/uL — ABNORMAL HIGH (ref 0.1–1.0)
Monocytes Relative: 8 % (ref 3–12)

## 2013-07-06 LAB — COMPREHENSIVE METABOLIC PANEL
ALT: 10 U/L (ref 0–35)
AST: 15 U/L (ref 0–37)
Calcium: 10 mg/dL (ref 8.4–10.5)
GFR calc Af Amer: 90 mL/min — ABNORMAL LOW (ref >90)
Glucose, Bld: 89 mg/dL (ref 70–99)
Sodium: 139 mEq/L (ref 135–145)
Total Protein: 6.2 g/dL (ref 6.0–8.3)

## 2013-07-06 MED ORDER — HEPARIN SOD (PORK) LOCK FLUSH 100 UNIT/ML IV SOLN
500.0000 [IU] | Freq: Once | INTRAVENOUS | Status: AC | PRN
  Administered 2013-07-06: 500 [IU]
  Filled 2013-07-06: qty 5

## 2013-07-06 MED ORDER — SODIUM CHLORIDE 0.9 % IV SOLN
Freq: Once | INTRAVENOUS | Status: AC
  Administered 2013-07-06: 8 mg via INTRAVENOUS
  Filled 2013-07-06: qty 4

## 2013-07-06 MED ORDER — SODIUM CHLORIDE 0.9 % IV SOLN
Freq: Once | INTRAVENOUS | Status: AC
  Administered 2013-07-06: 12:00:00 via INTRAVENOUS

## 2013-07-06 MED ORDER — VINORELBINE TARTRATE CHEMO INJECTION 50 MG/5ML
30.0000 mg/m2 | Freq: Once | INTRAVENOUS | Status: AC
  Administered 2013-07-06: 65 mg via INTRAVENOUS
  Filled 2013-07-06: qty 6.5

## 2013-07-06 MED ORDER — SULFAMETHOXAZOLE-TRIMETHOPRIM 800-160 MG PO TABS: 1.0000 | ORAL_TABLET | Freq: Two times a day (BID) | ORAL | Status: AC

## 2013-07-06 MED ORDER — HEPARIN SOD (PORK) LOCK FLUSH 100 UNIT/ML IV SOLN
INTRAVENOUS | Status: AC
  Filled 2013-07-06: qty 5

## 2013-07-06 MED ORDER — SODIUM CHLORIDE 0.9 % IV SOLN
800.0000 mg/m2 | Freq: Once | INTRAVENOUS | Status: AC
  Administered 2013-07-06: 1710 mg via INTRAVENOUS
  Filled 2013-07-06: qty 45

## 2013-07-06 NOTE — Patient Instructions (Addendum)
.  [facility] Discharge Instructions  RECOMMENDATIONS MADE BY THE CONSULTANT AND ANY TEST RESULTS WILL BE SENT TO YOUR REFERRING PHYSICIAN.  EXAM FINDINGS BY THE PHYSICIAN TODAY AND SIGNS OR SYMPTOMS TO REPORT TO CLINIC OR PRIMARY PHYSICIAN: We will plan on last tx being in about 4 weeks with a pet scan 2 weeks after that with a break to follow. Chemo 8/19 and 9/2 Continue monthly zometa  SPECIAL INSTRUCTIONS/FOLLOW-UP: Return in 4 weeks to see Kristen Parker  Thank you for choosing [facility] to provide your oncology and hematology care.  To afford each patient quality time with our providers, please arrive at least 15 minutes before your scheduled appointment time.  With your help, our goal is to use those 15 minutes to complete the necessary work-up to ensure our physicians have the information they need to help with your evaluation and healthcare recommendations.    Effective January 1st, 2014, we ask that you re-schedule your appointment with our physicians should you arrive 10 or more minutes late for your appointment.  We strive to give you quality time with our providers, and arriving late affects you and other patients whose appointments are after yours.    Again, thank you for choosing [facility]  Our hope is that these requests will decrease the amount of time that you wait before being seen by our physicians.       _____________________________________________________________  Should you have questions after your visit to [facility], please contact our office at [phone] between the hours of 8:30 a.m. and 5:00 p.m.  Voicemails left after 4:30 p.m. will not be returned until the following business day.  For prescription refill requests, have your pharmacy contact our office with your prescription refill request.

## 2013-07-06 NOTE — Progress Notes (Signed)
[name], MD [address] [place] [address]  Breast CA, unspecified laterality - Plan: CBC with Differential, Comprehensive metabolic panel, Cancer antigen 27.29, NM PET Image Restag (PS) Skull Base To Thigh  Bone metastases - Plan: CBC with Differential, Comprehensive metabolic panel, Cancer antigen 27.29, NM PET Image Restag (PS) Skull Base To Thigh  Mastitis - Plan: sulfamethoxazole-trimethoprim (BACTRIM DS,SEPTRA DS) 800-160 MG per tablet  CURRENT THERAPY: S/P 9 cycles of Gemcitabine/Navelbine starting on 03/02/2013  INTERVAL HISTORY: Kristen Parker 62 y.o. female returns for  regular  visit for followup of Metastatic lobular cancer now on Navelbine and Gemcitabine.  She is tolerating therapy well and is scheduled for cycle 10 of chemotherapy today which she will be administered as her labs meet treatment parameters.   Kristen Parker reports a "mastitis" on her left breast that began on Thursday and has slowly gotten worse. She reports that it is not painful or pruritic, but is uncomfortable.  She reports, "It feels like my breasts would when I was starting my period."  She denies any fevers or chills. Her WBC is elevated likely secondary to Neulasta injection.  She reports that she has had similar symptoms in the past and these were evaluated by Dr. Mariel Sleet.  He reported to her that, at that time, she had a cyst.  It resolved on its own with warm compresses.  She admits that since Thursday, she has been utilizing warm compresses.    She reports, of late, over the past 2 weeks, her energy level has significantly increased.  "I made dinner the other night."  Chart is reviewed.  She reports that her and Dr. Mariel Sleet discussed 6 months worth of therapy and then providing her a chemo-holiday while she goes on a trip to [facility].  She reports that her trip is in October, beginning of October.   With this information, I have developed a treatment plan with  the patient.  We will continue therapy until about mid-September at which time we will stop therapy and provide her recovery time in preparation for her trip to [place]. I will set her up for a repeat PET scan for restaging purposes.  We will continue with Zometa and adapt its treatment date to accommodate her trip.   We will see her back after her trip to [place] to discuss future treatment options.   Oncologically, she denies any complaints and ROS questioning is negative.    Past Medical History  Diagnosis Date  . Diabetes mellitus   . Graves disease   . Cancer     breast  . Breast CA 05/22/2011  . Thyroid disease     Graves disease/iodine ablation 16 yrs ago  . Hypertension   . Radiation   . Bone metastases     T-spine  . Bone metastases 06/20/2011  . 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     is allergic to betadine; erythromycin; nsaids; and tape.  Kristen Parker had no medications administered during this visit.  Past Surgical History  Procedure Laterality Date  . Breast biopsy      left  . Axillary node dissection      biopsy  . Breast biopsy      right  . Eye surgery      laser retinal tears  . Acdf    .  Cervical disc surgery      ant. fusion with plate and screws  . Cholecystectomy    . Cesarean section    . Tubal ligation    . Reproductive      Removal of rt reproductive system  . Dilatation and currettage    . Mastectomy      rt. radical  29 lymph nodes  . Portacath placement  2009  . Appendectomy    . Partial hysterectomy      Denies any headaches, dizziness, double vision, fevers, chills, night sweats, nausea, vomiting, diarrhea, constipation, chest pain, heart palpitations, shortness of breath, blood in stool, black tarry stool, urinary pain, urinary burning, urinary  frequency, hematuria.   PHYSICAL EXAMINATION  ECOG PERFORMANCE STATUS: 1 - Symptomatic but completely ambulatory  There were no vitals filed for this visit.  GENERAL:alert, no distress, well nourished, well developed, comfortable, cooperative and smiling SKIN: skin color, texture, turgor are normal, no rashes or significant lesions HEAD: Normocephalic, No masses, lesions, tenderness or abnormalities EYES: normal, PERRLA, EOMI, Conjunctiva are pink and non-injected EARS: External ears normal OROPHARYNX:mucous membranes are moist  NECK: supple, no adenopathy, thyroid normal size, non-tender, without nodularity, no stridor, non-tender, trachea midline LYMPH:  no palpable lymphadenopathy, no hepatosplenomegaly BREAST:left breast shows erythema surrounding areola without any obvious sites of origin or bug bite marks.  No peau d' orange changes LUNGS: clear to auscultation and percussion HEART: regular rate & rhythm, no murmurs, no gallops, S1 normal and S2 normal ABDOMEN:abdomen soft, non-tender, obese and normal bowel sounds BACK: Back symmetric, no curvature. EXTREMITIES:less then 2 second capillary refill, no joint deformities, effusion, or inflammation, no edema, no skin discoloration, no clubbing, no cyanosis, right UE compression sleeve  NEURO: alert & oriented x 3 with fluent speech, no focal motor/sensory deficits, gait normal   LABORATORY DATA: CBC    Component Value Date/Time   WBC 21.6* 07/06/2013 1104   RBC 2.85* 07/06/2013 1104   HGB 9.5* 07/06/2013 1104   HCT 29.2* 07/06/2013 1104   PLT 294 07/06/2013 1104   MCV 102.5* 07/06/2013 1104   MCH 33.3 07/06/2013 1104   MCHC 32.5 07/06/2013 1104   RDW 19.4* 07/06/2013 1104   LYMPHSABS 1.9 07/06/2013 1104   MONOABS 1.6* 07/06/2013 1104   EOSABS 0.1 07/06/2013 1104   BASOSABS 0.1 07/06/2013 1104      Chemistry      Component Value Date/Time   NA 139 07/06/2013 1104   K 3.9 07/06/2013 1104   CL 105 07/06/2013 1104   CO2 24 07/06/2013 1104   BUN 15  07/06/2013 1104   CREATININE 0.81 07/06/2013 1104      Component Value Date/Time   CALCIUM 10.0 07/06/2013 1104   ALKPHOS 109 07/06/2013 1104   AST 15 07/06/2013 1104   ALT 10 07/06/2013 1104   BILITOT 0.2* 07/06/2013 1104        ASSESSMENT:  1.  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  S/P 9 cycles of Gemcitabine/Navelbine starting on 03/02/2013.  See oncology history for treatment details from [facility] go-live date. 2. Left breast erythema  Patient Active Problem List   Diagnosis Date Noted  . 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 UTI (lower urinary tract infection) 06/25/2012  . Bone metastases 06/20/2011  . Breast CA 05/22/2011    Class: Diagnosis of  . ACHILLES TENDON TEAR 05/15/2007    PLAN:  1. I personally reviewed and went over laboratory results with the patient. 2. I personally reviewed  and went over radiographic studies with the patient. 3. Chart reviewed 4. Treatment plan reviewed 5. Rx for Septra DS BID x 10 days.  6. Continue warm compresses on Left breast  7. Continue treatment through August and into early September.  Will give drug holiday at that time. 8. PET scan in September for restaging. 9. Return in 4 weeks for follow-up  THERAPY PLAN:  Will give the patient a drug holiday while she goes on a trip to [facility].  We will restage her in September with PET scan.  Following [facility] trip, will see her back and discuss treatment options at that time.   All questions were answered. The patient knows to call the clinic with any problems, questions or concerns. We can certainly see the patient much sooner if necessary.  Patient and plan discussed with Dr. Gerarda Fraction and he is in agreement with the aforementioned.  Kristen Parker

## 2013-07-06 NOTE — Progress Notes (Signed)
Kristen Parker presented for labwork. Labs per MD order drawn via Peripheral Line 23 gauge needle inserted in left antecubital.  Good blood return present. Procedure without incident.  Needle removed intact. Patient tolerated procedure well.

## 2013-07-07 ENCOUNTER — Encounter (HOSPITAL_BASED_OUTPATIENT_CLINIC_OR_DEPARTMENT_OTHER): Payer: Medicare Other

## 2013-07-07 VITALS — BP 136/85 | HR 126

## 2013-07-07 DIAGNOSIS — C7951 Secondary malignant neoplasm of bone: Secondary | ICD-10-CM

## 2013-07-07 DIAGNOSIS — Z5189 Encounter for other specified aftercare: Secondary | ICD-10-CM

## 2013-07-07 DIAGNOSIS — C50519 Malignant neoplasm of lower-outer quadrant of unspecified female breast: Secondary | ICD-10-CM

## 2013-07-07 DIAGNOSIS — C50911 Malignant neoplasm of unspecified site of right female breast: Secondary | ICD-10-CM

## 2013-07-07 MED ORDER — PEGFILGRASTIM INJECTION 6 MG/0.6ML
SUBCUTANEOUS | Status: AC
  Filled 2013-07-07: qty 0.6

## 2013-07-07 MED ORDER — PEGFILGRASTIM INJECTION 6 MG/0.6ML
6.0000 mg | Freq: Once | SUBCUTANEOUS | Status: AC
  Administered 2013-07-07: 6 mg via SUBCUTANEOUS

## 2013-07-07 NOTE — Progress Notes (Signed)
VSS.  Tolerated Neulasta 6 mg sub-q to lower abd tissue well.

## 2013-07-09 ENCOUNTER — Ambulatory Visit (HOSPITAL_COMMUNITY): Payer: Medicare Other | Admitting: Oncology

## 2013-07-16 ENCOUNTER — Other Ambulatory Visit (HOSPITAL_COMMUNITY): Payer: Self-pay | Admitting: Oncology

## 2013-07-16 DIAGNOSIS — C50919 Malignant neoplasm of unspecified site of unspecified female breast: Secondary | ICD-10-CM

## 2013-07-16 MED ORDER — FIRST-MARYS MOUTHWASH MT SUSP: 5.0000 mL | Freq: Four times a day (QID) | OROMUCOSAL | Status: DC | PRN

## 2013-07-20 ENCOUNTER — Encounter (HOSPITAL_BASED_OUTPATIENT_CLINIC_OR_DEPARTMENT_OTHER): Payer: Medicare Other

## 2013-07-20 ENCOUNTER — Encounter (HOSPITAL_COMMUNITY): Payer: Medicare Other

## 2013-07-20 VITALS — BP 102/80 | HR 116 | Temp 98.0°F | Resp 20 | Wt 213.0 lb

## 2013-07-20 DIAGNOSIS — C50919 Malignant neoplasm of unspecified site of unspecified female breast: Secondary | ICD-10-CM

## 2013-07-20 DIAGNOSIS — C50519 Malignant neoplasm of lower-outer quadrant of unspecified female breast: Secondary | ICD-10-CM

## 2013-07-20 DIAGNOSIS — C7951 Secondary malignant neoplasm of bone: Secondary | ICD-10-CM

## 2013-07-20 DIAGNOSIS — C50911 Malignant neoplasm of unspecified site of right female breast: Secondary | ICD-10-CM

## 2013-07-20 DIAGNOSIS — Z5111 Encounter for antineoplastic chemotherapy: Secondary | ICD-10-CM

## 2013-07-20 LAB — COMPREHENSIVE METABOLIC PANEL
Albumin: 3.5 g/dL (ref 3.5–5.2)
BUN: 20 mg/dL (ref 6–23)
Creatinine, Ser: 0.7 mg/dL (ref 0.50–1.10)
Total Bilirubin: 0.2 mg/dL — ABNORMAL LOW (ref 0.3–1.2)
Total Protein: 6.1 g/dL (ref 6.0–8.3)

## 2013-07-20 LAB — CBC WITH DIFFERENTIAL/PLATELET
Basophils Relative: 0 % (ref 0–1)
Eosinophils Absolute: 0.1 10*3/uL (ref 0.0–0.7)
Eosinophils Relative: 0 % (ref 0–5)
HCT: 30.9 % — ABNORMAL LOW (ref 36.0–46.0)
Hemoglobin: 9.7 g/dL — ABNORMAL LOW (ref 12.0–15.0)
MCH: 32.8 pg (ref 26.0–34.0)
MCHC: 31.4 g/dL (ref 30.0–36.0)
Monocytes Absolute: 0.9 10*3/uL (ref 0.1–1.0)
Monocytes Relative: 6 % (ref 3–12)

## 2013-07-20 MED ORDER — SODIUM CHLORIDE 0.9 % IV SOLN
800.0000 mg/m2 | Freq: Once | INTRAVENOUS | Status: AC
  Administered 2013-07-20: 1710 mg via INTRAVENOUS
  Filled 2013-07-20: qty 44.97

## 2013-07-20 MED ORDER — ZOLEDRONIC ACID 4 MG/5ML IV CONC
4.0000 mg | Freq: Once | INTRAVENOUS | Status: AC
  Administered 2013-07-20: 4 mg via INTRAVENOUS
  Filled 2013-07-20: qty 5

## 2013-07-20 MED ORDER — HEPARIN SOD (PORK) LOCK FLUSH 100 UNIT/ML IV SOLN
500.0000 [IU] | Freq: Once | INTRAVENOUS | Status: AC | PRN
Start: 2013-07-20 — End: 2013-07-20
  Administered 2013-07-20: 500 [IU]
  Filled 2013-07-20: qty 5

## 2013-07-20 MED ORDER — SODIUM CHLORIDE 0.9 % IV SOLN
Freq: Once | INTRAVENOUS | Status: AC
  Administered 2013-07-20: 8 mg via INTRAVENOUS
  Filled 2013-07-20: qty 4

## 2013-07-20 MED ORDER — SODIUM CHLORIDE 0.9 % IV SOLN
Freq: Once | INTRAVENOUS | Status: AC
  Administered 2013-07-20: 10:00:00 via INTRAVENOUS

## 2013-07-20 MED ORDER — HEPARIN SOD (PORK) LOCK FLUSH 100 UNIT/ML IV SOLN
INTRAVENOUS | Status: AC
  Filled 2013-07-20: qty 5

## 2013-07-20 MED ORDER — VINORELBINE TARTRATE CHEMO INJECTION 50 MG/5ML
30.0000 mg/m2 | Freq: Once | INTRAVENOUS | Status: AC
  Administered 2013-07-20: 65 mg via INTRAVENOUS
  Filled 2013-07-20: qty 6.5

## 2013-07-21 ENCOUNTER — Encounter (HOSPITAL_BASED_OUTPATIENT_CLINIC_OR_DEPARTMENT_OTHER): Payer: Medicare Other

## 2013-07-21 VITALS — BP 132/53 | HR 120 | Temp 98.6°F | Resp 20

## 2013-07-21 DIAGNOSIS — C50519 Malignant neoplasm of lower-outer quadrant of unspecified female breast: Secondary | ICD-10-CM

## 2013-07-21 DIAGNOSIS — D6481 Anemia due to antineoplastic chemotherapy: Secondary | ICD-10-CM

## 2013-07-21 DIAGNOSIS — C50919 Malignant neoplasm of unspecified site of unspecified female breast: Secondary | ICD-10-CM

## 2013-07-21 DIAGNOSIS — D649 Anemia, unspecified: Secondary | ICD-10-CM

## 2013-07-21 DIAGNOSIS — C7951 Secondary malignant neoplasm of bone: Secondary | ICD-10-CM

## 2013-07-21 MED ORDER — DARBEPOETIN ALFA-POLYSORBATE 500 MCG/ML IJ SOLN
500.0000 ug | Freq: Once | INTRAMUSCULAR | Status: AC
  Administered 2013-07-21: 500 ug via SUBCUTANEOUS

## 2013-07-21 MED ORDER — DARBEPOETIN ALFA-POLYSORBATE 500 MCG/ML IJ SOLN
INTRAMUSCULAR | Status: AC
  Filled 2013-07-21: qty 1

## 2013-07-21 MED ORDER — PEGFILGRASTIM INJECTION 6 MG/0.6ML
SUBCUTANEOUS | Status: AC
  Filled 2013-07-21: qty 0.6

## 2013-07-21 MED ORDER — PEGFILGRASTIM INJECTION 6 MG/0.6ML
6.0000 mg | Freq: Once | SUBCUTANEOUS | Status: AC
  Administered 2013-07-21: 6 mg via SUBCUTANEOUS

## 2013-07-21 NOTE — Progress Notes (Signed)
Kristen Parker presents today for injection per MD orders. Neulasta 6mg  administered SQ in right Abdomen. Administration without incident. Patient tolerated well. Kristen Parker presents today for injection per MD orders. Aranesp 500 mcg administered SQ in left Abdomen. Administration without incident. Patient tolerated well.

## 2013-07-23 ENCOUNTER — Encounter: Payer: Self-pay | Admitting: Oncology

## 2013-08-03 ENCOUNTER — Encounter (HOSPITAL_BASED_OUTPATIENT_CLINIC_OR_DEPARTMENT_OTHER): Payer: Medicare Other

## 2013-08-03 ENCOUNTER — Encounter (HOSPITAL_COMMUNITY): Payer: Medicare Other | Attending: Oncology | Admitting: Oncology

## 2013-08-03 VITALS — BP 135/54 | HR 121 | Temp 98.2°F | Resp 20 | Wt 214.0 lb

## 2013-08-03 DIAGNOSIS — D6481 Anemia due to antineoplastic chemotherapy: Secondary | ICD-10-CM | POA: Insufficient documentation

## 2013-08-03 DIAGNOSIS — C50519 Malignant neoplasm of lower-outer quadrant of unspecified female breast: Secondary | ICD-10-CM

## 2013-08-03 DIAGNOSIS — C7951 Secondary malignant neoplasm of bone: Secondary | ICD-10-CM

## 2013-08-03 DIAGNOSIS — C801 Malignant (primary) neoplasm, unspecified: Secondary | ICD-10-CM | POA: Insufficient documentation

## 2013-08-03 DIAGNOSIS — C50919 Malignant neoplasm of unspecified site of unspecified female breast: Secondary | ICD-10-CM

## 2013-08-03 DIAGNOSIS — Z5111 Encounter for antineoplastic chemotherapy: Secondary | ICD-10-CM

## 2013-08-03 LAB — CBC WITH DIFFERENTIAL/PLATELET
Eosinophils Absolute: 0.1 10*3/uL (ref 0.0–0.7)
Lymphs Abs: 1.3 10*3/uL (ref 0.7–4.0)
MCH: 31.7 pg (ref 26.0–34.0)
Neutrophils Relative %: 84 % — ABNORMAL HIGH (ref 43–77)
Platelets: 371 10*3/uL (ref 150–400)
RBC: 3.06 MIL/uL — ABNORMAL LOW (ref 3.87–5.11)
WBC: 14.1 10*3/uL — ABNORMAL HIGH (ref 4.0–10.5)

## 2013-08-03 LAB — CANCER ANTIGEN 27.29: CA 27.29: 83 U/mL — ABNORMAL HIGH (ref 0–39)

## 2013-08-03 MED ORDER — HEPARIN SOD (PORK) LOCK FLUSH 100 UNIT/ML IV SOLN
500.0000 [IU] | Freq: Once | INTRAVENOUS | Status: AC
  Administered 2013-08-03: 500 [IU] via INTRAVENOUS
  Filled 2013-08-03: qty 5

## 2013-08-03 MED ORDER — HEPARIN SOD (PORK) LOCK FLUSH 100 UNIT/ML IV SOLN
INTRAVENOUS | Status: AC
  Filled 2013-08-03: qty 5

## 2013-08-03 MED ORDER — SODIUM CHLORIDE 0.9 % IV SOLN
Freq: Once | INTRAVENOUS | Status: AC
  Administered 2013-08-03: 11:00:00 via INTRAVENOUS

## 2013-08-03 MED ORDER — VINORELBINE TARTRATE CHEMO INJECTION 50 MG/5ML
30.0000 mg/m2 | Freq: Once | INTRAVENOUS | Status: AC
  Administered 2013-08-03: 65 mg via INTRAVENOUS
  Filled 2013-08-03: qty 6.5

## 2013-08-03 MED ORDER — SODIUM CHLORIDE 0.9 % IV SOLN
Freq: Once | INTRAVENOUS | Status: AC
  Administered 2013-08-03: 8 mg via INTRAVENOUS
  Filled 2013-08-03: qty 4

## 2013-08-03 MED ORDER — SODIUM CHLORIDE 0.9 % IV SOLN
800.0000 mg/m2 | Freq: Once | INTRAVENOUS | Status: AC
  Administered 2013-08-03: 1710 mg via INTRAVENOUS
  Filled 2013-08-03: qty 44.97

## 2013-08-03 NOTE — Patient Instructions (Addendum)
.  [facility] Discharge Instructions  RECOMMENDATIONS MADE BY THE CONSULTANT AND ANY TEST RESULTS WILL BE SENT TO YOUR REFERRING PHYSICIAN.  EXAM FINDINGS BY THE PHYSICIAN TODAY AND SIGNS OR SYMPTOMS TO REPORT TO CLINIC OR PRIMARY PHYSICIAN:  Chemo Holiday after today!!   INSTRUCTIONS GIVEN AND DISCUSSED: Hot compresses to left breast, call if worsens or you develop fever.  SPECIAL INSTRUCTIONS/FOLLOW-UP: zometa as scheduled on the 16th then we will schedule the next one after your trips. Oct 28th MD appt.  Call if you need [place]  Thank you for choosing [facility] to provide your oncology and hematology care.  To afford each patient quality time with our providers, please arrive at least 15 minutes before your scheduled appointment time.  With your help, our goal is to use those 15 minutes to complete the necessary work-up to ensure our physicians have the information they need to help with your evaluation and healthcare recommendations.    Effective January 1st, 2014, we ask that you re-schedule your appointment with our physicians should you arrive 10 or more minutes late for your appointment.  We strive to give you quality time with our providers, and arriving late affects you and other patients whose appointments are after yours.    Again, thank you for choosing [facility].  Our hope is that these requests will decrease the amount of time that you wait before being seen by our physicians.       _____________________________________________________________  Should you have questions after your visit to [facility], please contact our office at [phone] between the hours of 8:30 a.m. and 5:00 p.m.  Voicemails left after 4:30 p.m. will not be returned until the following business day.  For prescription refill requests, have your pharmacy contact our office with your prescription refill request.

## 2013-08-03 NOTE — Progress Notes (Signed)
[name], MD [address] [place] [address]  Breast CA, unspecified laterality  Bone metastases  CURRENT THERAPY:S/P 11 cycles of Gemcitabine/Navelbine starting on 03/02/2013   INTERVAL HISTORY: Kristen Parker 62 y.o. female returns for  regular  visit for followup of Metastatic lobular cancer now on Navelbine and Gemcitabine.   She continues to tolerate therapy well.  She is here today to receive her 12 cycle and then she will go on a chemotherapy-holiday.  She is scheduled for a restaging PET scan on 08/16/2013.    She reports that her left breast erythema is much improved and nearly resolved, but she reports drainage from the areola.  She reports that she had this before and it was treated symptomatically after a complete work-up.  She reports that she has a history of cysts that come to the surface, drain, and heal.  She reports that the drainage was beige in color.  See exam below.   We discussed her future plan which will consist of a chemotherapy holiday while she goes to the beach followed by a trip to [facility].  We will continue with Zometa and reschedule as needed to allow her to go on these trips.    She reports a left thumb muscle cramp.  Her K+ is pending at the time of this note and office visit.   She reports worsening right eye vision, requiring an Rx change.  She has seen Dr. Charise Killian who reports that she may be developing cataracts in that right eye, but no evidence of tumor on examination, per patient.   Oncologically, she otherwise denies any complaints and ROS questioning is negative.   Past Medical History  Diagnosis Date  . Diabetes mellitus   . Graves disease   . Cancer     breast  . Breast CA 05/22/2011  . Thyroid disease     Graves disease/iodine ablation 16 yrs ago  . Hypertension   . Radiation   . Bone metastases     T-spine  . Bone metastases 06/20/2011  . Antineoplastic chemotherapy induced anemia(285.3)  04/13/2013    has ACHILLES TENDON TEAR; Breast CA; Bone metastases; UTI (lower urinary tract infection); and Antineoplastic chemotherapy induced anemia(285.3) on her problem list.     is allergic to betadine; erythromycin; nsaids; and tape.  Ms. Borrayo does not currently have medications on file.  Past Surgical History  Procedure Laterality Date  . Breast biopsy      left  . Axillary node dissection      biopsy  . Breast biopsy      right  . Eye surgery      laser retinal tears  . Acdf    . Cervical disc surgery      ant. fusion with plate and screws  . Cholecystectomy    . Cesarean section    . Tubal ligation    . Reproductive      Removal of rt reproductive system  . Dilatation and currettage    . Mastectomy      rt. radical  29 lymph nodes  . Portacath placement  2009  . Appendectomy    . Partial hysterectomy      Denies any headaches, dizziness, double vision, fevers, chills, night sweats, nausea, vomiting, diarrhea, constipation, chest pain, heart palpitations, shortness of breath, blood in stool, black tarry stool, urinary pain, urinary burning, urinary frequency, hematuria.   PHYSICAL EXAMINATION  ECOG PERFORMANCE STATUS: 1 -  Symptomatic but completely ambulatory  There were no vitals filed for this visit.  GENERAL:alert, no distress, well nourished, well developed, comfortable, cooperative, obese and smiling SKIN: skin color, texture, turgor are normal, no rashes or significant lesions HEAD: Normocephalic, No masses, lesions, tenderness or abnormalities EYES: normal, PERRLA, EOMI, Conjunctiva are pink and non-injected EARS: External ears normal OROPHARYNX:lips, buccal mucosa, and tongue normal and mucous membranes are moist  NECK: supple, no adenopathy, thyroid normal size, non-tender, without nodularity, no stridor, non-tender, trachea midline LYMPH:  no palpable lymphadenopathy BREAST: left breast reveals a small erythematous lesion in the 11/oclock  position, on the areola that is clean and does not appear infected. Right post-mastectomy site well healed and free of suspicious changes LUNGS: clear to auscultation and percussion HEART: regular rate & rhythm, no murmurs, no gallops, S1 normal and S2 normal ABDOMEN:abdomen soft, non-tender, obese and normal bowel sounds BACK: Back symmetric, no curvature. EXTREMITIES:less then 2 second capillary refill, no joint deformities, effusion, or inflammation, no skin discoloration, no clubbing, no cyanosis, positive findings:  edema Right arm lymphedema with sleeve in place.  NEURO: alert & oriented x 3 with fluent speech, no focal motor/sensory deficits, gait normal   LABORATORY DATA: CBC    Component Value Date/Time   WBC 14.1* 08/03/2013 0938   RBC 3.06* 08/03/2013 0938   HGB 9.7* 08/03/2013 0938   HCT 31.4* 08/03/2013 0938   PLT 371 08/03/2013 0938   MCV 102.6* 08/03/2013 0938   MCH 31.7 08/03/2013 0938   MCHC 30.9 08/03/2013 0938   RDW 18.7* 08/03/2013 0938   LYMPHSABS 1.3 08/03/2013 0938   MONOABS 0.9 08/03/2013 0938   EOSABS 0.1 08/03/2013 0938   BASOSABS 0.0 08/03/2013 0938      Chemistry      Component Value Date/Time   NA 140 07/20/2013 0955   K 3.8 07/20/2013 0955   CL 107 07/20/2013 0955   CO2 23 07/20/2013 0955   BUN 20 07/20/2013 0955   CREATININE 0.70 07/20/2013 0955      Component Value Date/Time   CALCIUM 10.2 07/20/2013 0955   ALKPHOS 97 07/20/2013 0955   AST 14 07/20/2013 0955   ALT 10 07/20/2013 0955   BILITOT 0.2* 07/20/2013 0955          ASSESSMENT:  1. 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 S/P 9 cycles of Gemcitabine/Navelbine starting on 03/02/2013. See oncology history for treatment details from [facility] go-live date.  2. Left areolar cyst, healing.    Patient Active Problem List   Diagnosis Date Noted  . 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 UTI (lower urinary tract infection) 06/25/2012  . Bone metastases 06/20/2011  . Breast CA  05/22/2011    Class: Diagnosis of  . ACHILLES TENDON TEAR 05/15/2007     PLAN:  1. I personally reviewed and went over laboratory results with the patient. 2. Labs are pending for chemotherapy.  3. PET scan on 08/16/2013 for restaging 4. Cycle 12 as scheduled pending lab results.  5. PET scan for restaging on 08/16/2013 as scheduled.  6. Chemotherapy-holiday following this cycle of chemotherapy.  7. Continue warm compresses to left areola.   8. Contact clinic with worsening breast signs or symptoms.  If this occurs, will consider Keflex/Doxycycline antibiotics.  9. Return as scheduled for follow-up.    THERAPY PLAN:  Will provide a therapy-holiday following this cycle (12) of chemotherapy while she goes on trips to the beach and [facility].  Will restage her with PET  scan and then 2-3 months later will need to consider restaging scans to prove stability of disease.  If stability is attained, will continue to consider drug-holiday.  All questions were answered. The patient knows to call the clinic with any problems, questions or concerns. We can certainly see the patient much sooner if necessary.  Patient and plan discussed with Dr. Erline Hau and he is in agreement with the aforementioned.   Vianca Bracher

## 2013-08-04 ENCOUNTER — Encounter (HOSPITAL_BASED_OUTPATIENT_CLINIC_OR_DEPARTMENT_OTHER): Payer: Medicare Other

## 2013-08-04 VITALS — BP 119/85 | HR 109

## 2013-08-04 DIAGNOSIS — C50519 Malignant neoplasm of lower-outer quadrant of unspecified female breast: Secondary | ICD-10-CM

## 2013-08-04 DIAGNOSIS — C7951 Secondary malignant neoplasm of bone: Secondary | ICD-10-CM

## 2013-08-04 DIAGNOSIS — Z5189 Encounter for other specified aftercare: Secondary | ICD-10-CM

## 2013-08-04 DIAGNOSIS — C50919 Malignant neoplasm of unspecified site of unspecified female breast: Secondary | ICD-10-CM

## 2013-08-04 MED ORDER — PEGFILGRASTIM INJECTION 6 MG/0.6ML
6.0000 mg | Freq: Once | SUBCUTANEOUS | Status: AC
  Administered 2013-08-04: 6 mg via SUBCUTANEOUS

## 2013-08-04 MED ORDER — PEGFILGRASTIM INJECTION 6 MG/0.6ML
SUBCUTANEOUS | Status: AC
  Filled 2013-08-04: qty 0.6

## 2013-08-04 NOTE — Progress Notes (Signed)
Erling Conte presents today for injection per MD orders. Neulasta 6mg  administered SQ in right Abdomen. Administration without incident. Patient tolerated well.

## 2013-08-11 ENCOUNTER — Ambulatory Visit (HOSPITAL_COMMUNITY): Payer: Medicare Other

## 2013-08-16 ENCOUNTER — Encounter (HOSPITAL_COMMUNITY)
Admission: RE | Admit: 2013-08-16 | Discharge: 2013-08-16 | Disposition: A | Discharge status: Home / Self Care | Payer: Medicare Other | Source: Ambulatory Visit | Attending: Oncology | Admitting: Oncology

## 2013-08-16 DIAGNOSIS — C50919 Malignant neoplasm of unspecified site of unspecified female breast: Secondary | ICD-10-CM | POA: Insufficient documentation

## 2013-08-16 DIAGNOSIS — C801 Malignant (primary) neoplasm, unspecified: Secondary | ICD-10-CM | POA: Insufficient documentation

## 2013-08-16 DIAGNOSIS — C7951 Secondary malignant neoplasm of bone: Secondary | ICD-10-CM | POA: Insufficient documentation

## 2013-08-16 LAB — GLUCOSE, CAPILLARY: Glucose-Capillary: 137 mg/dL — ABNORMAL HIGH (ref 70–99)

## 2013-08-16 MED ORDER — FLUDEOXYGLUCOSE F - 18 (FDG) INJECTION
17.1000 | Freq: Once | INTRAVENOUS | Status: AC | PRN
  Administered 2013-08-16: 17.1 via INTRAVENOUS

## 2013-08-17 ENCOUNTER — Encounter (HOSPITAL_BASED_OUTPATIENT_CLINIC_OR_DEPARTMENT_OTHER): Payer: Medicare Other

## 2013-08-17 DIAGNOSIS — C50911 Malignant neoplasm of unspecified site of right female breast: Secondary | ICD-10-CM

## 2013-08-17 DIAGNOSIS — C50919 Malignant neoplasm of unspecified site of unspecified female breast: Secondary | ICD-10-CM

## 2013-08-17 DIAGNOSIS — C50519 Malignant neoplasm of lower-outer quadrant of unspecified female breast: Secondary | ICD-10-CM

## 2013-08-17 DIAGNOSIS — C7951 Secondary malignant neoplasm of bone: Secondary | ICD-10-CM

## 2013-08-17 DIAGNOSIS — D6481 Anemia due to antineoplastic chemotherapy: Secondary | ICD-10-CM

## 2013-08-17 LAB — COMPREHENSIVE METABOLIC PANEL
ALT: 10 U/L (ref 0–35)
AST: 13 U/L (ref 0–37)
Alkaline Phosphatase: 100 U/L (ref 39–117)
CO2: 26 mEq/L (ref 19–32)
Calcium: 9.7 mg/dL (ref 8.4–10.5)
Glucose, Bld: 195 mg/dL — ABNORMAL HIGH (ref 70–99)
Potassium: 3.5 mEq/L (ref 3.5–5.1)
Sodium: 141 mEq/L (ref 135–145)
Total Protein: 6.3 g/dL (ref 6.0–8.3)

## 2013-08-17 LAB — CBC WITH DIFFERENTIAL/PLATELET
Basophils Absolute: 0.1 10*3/uL (ref 0.0–0.1)
Eosinophils Absolute: 0.1 10*3/uL (ref 0.0–0.7)
Eosinophils Relative: 0 % (ref 0–5)
Lymphocytes Relative: 7 % — ABNORMAL LOW (ref 12–46)
Lymphs Abs: 1.3 10*3/uL (ref 0.7–4.0)
MCV: 101.3 fL — ABNORMAL HIGH (ref 78.0–100.0)
Neutrophils Relative %: 87 % — ABNORMAL HIGH (ref 43–77)
Platelets: 320 10*3/uL (ref 150–400)
RBC: 3.1 MIL/uL — ABNORMAL LOW (ref 3.87–5.11)
RDW: 18.4 % — ABNORMAL HIGH (ref 11.5–15.5)
WBC: 17.8 10*3/uL — ABNORMAL HIGH (ref 4.0–10.5)

## 2013-08-17 MED ORDER — SODIUM CHLORIDE 0.9 % IJ SOLN
10.0000 mL | INTRAMUSCULAR | Status: DC | PRN
  Filled 2013-08-17: qty 10

## 2013-08-17 MED ORDER — HEPARIN SOD (PORK) LOCK FLUSH 100 UNIT/ML IV SOLN
250.0000 [IU] | Freq: Once | INTRAVENOUS | Status: DC | PRN
  Filled 2013-08-17: qty 5

## 2013-08-17 MED ORDER — ZOLEDRONIC ACID 4 MG/5ML IV CONC
4.0000 mg | Freq: Once | INTRAVENOUS | Status: AC
  Administered 2013-08-17: 4 mg via INTRAVENOUS
  Filled 2013-08-17: qty 5

## 2013-08-17 MED ORDER — DARBEPOETIN ALFA-POLYSORBATE 500 MCG/ML IJ SOLN
500.0000 ug | Freq: Once | INTRAMUSCULAR | Status: AC
  Administered 2013-08-17: 500 ug via SUBCUTANEOUS

## 2013-08-17 MED ORDER — DARBEPOETIN ALFA-POLYSORBATE 500 MCG/ML IJ SOLN
INTRAMUSCULAR | Status: AC
  Filled 2013-08-17: qty 1

## 2013-08-17 MED ORDER — ALTEPLASE 2 MG IJ SOLR
2.0000 mg | Freq: Once | INTRAMUSCULAR | Status: DC | PRN
  Filled 2013-08-17: qty 2

## 2013-08-17 MED ORDER — HEPARIN SOD (PORK) LOCK FLUSH 100 UNIT/ML IV SOLN
500.0000 [IU] | Freq: Once | INTRAVENOUS | Status: AC | PRN
  Administered 2013-08-17: 500 [IU]
  Filled 2013-08-17: qty 5

## 2013-08-17 MED ORDER — HEPARIN SOD (PORK) LOCK FLUSH 100 UNIT/ML IV SOLN
INTRAVENOUS | Status: AC
  Filled 2013-08-17: qty 5

## 2013-08-17 MED ORDER — SODIUM CHLORIDE 0.9 % IJ SOLN
3.0000 mL | Freq: Once | INTRAMUSCULAR | Status: DC | PRN
  Filled 2013-08-17: qty 10

## 2013-08-17 MED ORDER — SODIUM CHLORIDE 0.9 % IV SOLN
Freq: Once | INTRAVENOUS | Status: AC
  Administered 2013-08-17: 10:00:00 via INTRAVENOUS

## 2013-08-17 NOTE — Progress Notes (Signed)
Kristen Parker presents today for injection per MD orders. Aranesp 500 mcg administered SQ in left Abdomen. Administration without incident. Patient tolerated well.

## 2013-08-20 ENCOUNTER — Other Ambulatory Visit (HOSPITAL_COMMUNITY): Payer: Self-pay | Admitting: Oncology

## 2013-08-20 DIAGNOSIS — C50919 Malignant neoplasm of unspecified site of unspecified female breast: Secondary | ICD-10-CM

## 2013-08-20 MED ORDER — FIRST-MARYS MOUTHWASH MT SUSP: 5.0000 mL | Freq: Four times a day (QID) | OROMUCOSAL | Status: DC | PRN

## 2013-08-20 MED ORDER — MEGESTROL ACETATE 40 MG PO TABS: 40.0000 mg | ORAL_TABLET | Freq: Every day | ORAL | Status: AC

## 2013-08-25 ENCOUNTER — Other Ambulatory Visit (HOSPITAL_COMMUNITY): Payer: Self-pay | Admitting: Oncology

## 2013-08-25 ENCOUNTER — Ambulatory Visit (HOSPITAL_COMMUNITY)
Admission: RE | Admit: 2013-08-25 | Discharge: 2013-08-25 | Disposition: A | Discharge status: Home / Self Care | Payer: Medicare Other | Source: Ambulatory Visit | Attending: Oncology | Admitting: Oncology

## 2013-08-25 ENCOUNTER — Telehealth (HOSPITAL_COMMUNITY): Payer: Self-pay

## 2013-08-25 ENCOUNTER — Encounter (HOSPITAL_BASED_OUTPATIENT_CLINIC_OR_DEPARTMENT_OTHER): Payer: Medicare Other | Admitting: Oncology

## 2013-08-25 ENCOUNTER — Encounter (HOSPITAL_COMMUNITY): Payer: Self-pay | Admitting: Oncology

## 2013-08-25 DIAGNOSIS — C50919 Malignant neoplasm of unspecified site of unspecified female breast: Secondary | ICD-10-CM

## 2013-08-25 DIAGNOSIS — C7931 Secondary malignant neoplasm of brain: Secondary | ICD-10-CM | POA: Insufficient documentation

## 2013-08-25 DIAGNOSIS — C50519 Malignant neoplasm of lower-outer quadrant of unspecified female breast: Secondary | ICD-10-CM

## 2013-08-25 DIAGNOSIS — R4182 Altered mental status, unspecified: Secondary | ICD-10-CM | POA: Insufficient documentation

## 2013-08-25 DIAGNOSIS — C7951 Secondary malignant neoplasm of bone: Secondary | ICD-10-CM

## 2013-08-25 DIAGNOSIS — R51 Headache: Secondary | ICD-10-CM

## 2013-08-25 HISTORY — DX: Secondary malignant neoplasm of brain: C79.31

## 2013-08-25 MED ORDER — DEXAMETHASONE 4 MG PO TABS: 8.0000 mg | ORAL_TABLET | Freq: Two times a day (BID) | ORAL | Status: AC

## 2013-08-25 MED ORDER — HYDROCODONE-ACETAMINOPHEN 5-325 MG PO TABS: 1.0000 | ORAL_TABLET | ORAL | Status: AC | PRN

## 2013-08-25 MED ORDER — DEXAMETHASONE 4 MG PO TABS: ORAL_TABLET | ORAL | Status: DC

## 2013-08-25 MED ORDER — GADOBENATE DIMEGLUMINE 529 MG/ML IV SOLN
20.0000 mL | Freq: Once | INTRAVENOUS | Status: AC | PRN
  Administered 2013-08-25: 20 mL via INTRAVENOUS

## 2013-08-25 MED ORDER — HYDROCODONE-ACETAMINOPHEN 5-325 MG PO TABS: 1.0000 | ORAL_TABLET | ORAL | Status: DC | PRN

## 2013-08-25 NOTE — Telephone Encounter (Signed)
MRI brain w and w/o contrast ordered stat for mental status change with HA in the setting of metastatic breast cancer to evaluate for brain mets.  Symptoms reported per patient telephone call.

## 2013-08-25 NOTE — Patient Instructions (Addendum)
[facility] Discharge Instructions  RECOMMENDATIONS MADE BY THE CONSULTANT AND ANY TEST RESULTS WILL BE SENT TO YOUR REFERRING PHYSICIAN.  EXAM FINDINGS BY THE PHYSICIAN TODAY AND SIGNS OR SYMPTOMS TO REPORT TO CLINIC OR PRIMARY PHYSICIAN: Exam and findings as discussed by Dellis Anes, PA-C.  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  We will get you started on Dexamethasone and will contact Radiation Therapy and Dr. Franky Macho.  MEDICATIONS PRESCRIBED:  Dexamethasone 8 mg twice daily.  Take 8 mg as soon as you get home and 8 mg again just before bedtime.  INSTRUCTIONS/FOLLOW-UP: Call us on Friday and let us know how you are doing.  Will try to get things done as soon as possible and will let you know.  Thank you for choosing [facility] to provide your oncology and hematology care.  To afford each patient quality time with our providers, please arrive at least 15 minutes before your scheduled appointment time.  With your help, our goal is to use those 15 minutes to complete the necessary work-up to ensure our physicians have the information they need to help with your evaluation and healthcare recommendations.    Effective January 1st, 2014, we ask that you re-schedule your appointment with our physicians should you arrive 10 or more minutes late for your appointment.  We strive to give you quality time with our providers, and arriving late affects you and other patients whose appointments are after yours.    Again, thank you for choosing [facility].  Our hope is that these requests will decrease the amount of time that you wait before being seen by our physicians.       _____________________________________________________________  Should you have questions after your visit to [facility], please contact our office at [phone] between the hours of 8:30 a.m. and 5:00 p.m.  Voicemails left after 4:30 p.m. will not be returned  until the following business day.  For prescription refill requests, have your pharmacy contact our office with your prescription refill request.

## 2013-08-25 NOTE — Telephone Encounter (Signed)
Complains with headache for 2 weeks.  Stated "when I stand up it feels like the whole top of my head is going to explode. When I walk, I'm stumbling a lot, walking like someone drunk".  Denies any weakness in extremities, no nausea or vomiting - "just weak".

## 2013-08-25 NOTE — Progress Notes (Signed)
Kristen Parker is seen as a work-in today.  She called the clinic complaining of a headache daily x 2 weeks, weakness, and difficulty with ambulation.  After receiving the message, I ordered a STAT MRI brain with and without contrast.  The patient is seen in the clinic following the MRI.  She reiterates the statements above regarding her neurologic symptoms. She denies double vision, but admits to B/L worsening vision (suspect cataracts).  She reports that she has a daily headaches that come and goes, but recurs daily multiple times throughout the day.  She reports to B/L deep to orbits headaches, right occipital headache, and superior parietal headaches, particularly when standing.   PE: There were no vitals taken for this visit.  Gen: A+O x 3, NAD. Pleasant.   HEENT: Atraumatic, normocephalic Neck: Supple, trachea midline Extremities: B/L strength is equal Skin: Warm and dry Neuro: Ambulation is altered with unsteady gait and unable to walk in straight line, Heel-to-toe ambulation is unable to be performed due to instability.  Equal strength in each hand.  Tongue midline.  Occasional difficulty with word finding. Oriented x 3.   Radiology:  08/25/2013:  *RADIOLOGY REPORT*  Clinical Data: Mental status changes. Changes in ambulation.  Breast cancer.  MRI HEAD WITHOUT AND WITH CONTRAST  Technique: Multiplanar, multiecho pulse sequences of the brain and  surrounding structures were obtained according to standard protocol  without and with intravenous contrast  Contrast: 20mL MULTIHANCE GADOBENATE DIMEGLUMINE 529 MG/ML IV SOLN  Comparison: 09/08/2008.  Findings: No acute stroke or hemorrhage. No hydrocephalus or extra-  axial fluid. Moderate atrophy. Chronic microvascular ischemic  change in the periventricular and subcortical white matter.  Multiple sclerotic lesions in the calvarium and upper cervical  vertebrae reflect known osseous metastatic disease.  Post infusion imaging demonstrates a new  abnormality in the right  inferior cerebellar hemisphere representing a solid metastasis.  Cross-sectional measurements are 10 x 15 x 13 mm. There is mild  surrounding vasogenic edema but no midline shift or incipient  herniation.  No other areas of abnormal enhancement are seen except for a small  parafalcine meningioma in the interhemispheric fissure of the  frontal lobe on the right of doubtful significance measuring 6 x 6  x 3 mm.  Compared with priors, no metastases were seen. Bone metastases  were present. Chronic microvascular ischemic change has  progressed. Small meningioma is stable.  IMPRESSION:  10 x 15 x 13 mm right inferior cerebellar hemispheric solitary  metastasis.  Progression of atrophy and chronic microvascular ischemic change.  Incidental interhemispheric meningioma, stable.  Sclerotic bone lesions representing treated osseous metastases.  Original Report Authenticated By: Davonna Belling, M.D.   Assessment: 1. 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 S/P 12 cycles of Gemcitabine/Navelbine starting on 03/02/2013. See oncology history for treatment details from [facility] go-live date.    Plan: 1. MRI brain w and wo contrast STAT 2. I personally reviewed and went over radiographic studies with the patient. 3. Long discussion regarding radiographic findings.  4. Dexamethasone 8 mg BID (starting today) 5. Patient has planned trip to [facility] on 09/04/2013 with return on 09/10/2013. 6. Placed a Call to Rad Onc, awaiting return telephone call 7. Patient requested I update Dr. Coletta Memos in the event that he may be able to assist with SRS/Gamma knife procedure.  I placed a call to him.  He is in surgery.  Provided Diplomatic Services operational officer with pager number. 8. New Rx for Dexamethasone escribed  9. Refill of Hydrocodone 5/325 #90 printed. 10. Patient agreeable to [facility] intervention if she is thought to be a candidate by radiation  oncology.  11. Return as scheduled on 09/01/2013.   Patient and plan discussed with Dr. Alla German and he is in agreement with the aforementioned.   KEFALAS,THOMAS

## 2013-08-25 NOTE — Telephone Encounter (Signed)
Spoke with husband and notified that Elodia is to come in for MRI of brain as soon as she can.

## 2013-08-26 ENCOUNTER — Telehealth (HOSPITAL_COMMUNITY): Payer: Self-pay

## 2013-08-26 ENCOUNTER — Other Ambulatory Visit: Payer: Self-pay | Admitting: Radiation Therapy

## 2013-08-26 DIAGNOSIS — C7931 Secondary malignant neoplasm of brain: Secondary | ICD-10-CM

## 2013-08-26 NOTE — Telephone Encounter (Signed)
Call from [place].  States that she thinks she feels somewhat better.  Headache not quite as severe and stumbling has improved a little.  Encouraged to use pain med for headaches.

## 2013-08-30 ENCOUNTER — Encounter: Payer: Self-pay | Admitting: Radiation Oncology

## 2013-08-30 ENCOUNTER — Other Ambulatory Visit: Payer: Self-pay | Admitting: Obstetrics & Gynecology

## 2013-08-30 NOTE — Progress Notes (Addendum)
Location/Histology of Brain Tumor: Right Cerebellum  Patient presented with symptoms of: head aches, daily multiple times throughout the day  x 2 weeks, weakness, difficulty ambulation, b/l worseing vision, right extremity weaker than left  Past or anticipated interventions, if any, per neurosurgery: update Dr.Kabbell   Past or anticipated interventions, if any, per medical oncology: Stat MRI 08/25/13, r MRI today 09/01/13, referral back to Dr.Kabbell, Rad./ONC, follow up appt 09/02/13,  ranasp 08/31/13 if labs ok on break from chemotherapy  Dose of Decadron, if applicable: Decadron 4 mg bid with meals  Recent neurologic symptoms, if any:   Seizures: no  Headaches: yes occipital and temporal pain 8   Nausea:no  Dizziness/ataxia:yes  Difficulty with hand coordination:no  Focal numbness/weakness:yes,difficulty ambulation , unable to walk straight line, unsteady,   Visual deficits/changes: yes  Confusion/Memory deficits:yes,  Painful bone metastases at present, if any: lunbar/sacral pain  7-8 on 1 10 scale,   SAFETY ISSUES: fall precautions, weak,  unsteady, difficulty ambulating fell several times yesterday at home 08/31/13 Prior radiation? Yes, Palliative rad  Metastatic breast cancer , tx to T-spine to T-6,upper T11 11/29/09-12/20/09; Right sided Breast cancer 10/06/08-11/28/08   Pacemaker/ICD?NO  Possible current pregnancy?no  Is the patient on methotrexate? no  Additional Complaints / other details: mild  expressive aphasia, metasatic lobular cancer now on Navelbine& Gentacitabine, s/p 12 cycles  Started 03/02/13, went to the ED at [facility] yesterday s/p falls,weakness, has UTI, placed on cipro po Also very blurred vision for 1 month now,  Has porta cath , no blood return stated, was TPA the other day, patent but still no blood return ,has been in 5 years, per Dr.Mark Fayrene Fearing at [facility] left port site looks good, Xray 9/3 0/14 states stable positioning or porta cath in upper  left SVC

## 2013-08-31 ENCOUNTER — Emergency Department (HOSPITAL_COMMUNITY)
Admission: EM | Admit: 2013-08-31 | Discharge: 2013-08-31 | Disposition: A | Discharge status: Home / Self Care | Payer: Medicare Other | Attending: Emergency Medicine | Admitting: Emergency Medicine

## 2013-08-31 ENCOUNTER — Encounter (HOSPITAL_COMMUNITY): Payer: Self-pay | Admitting: Emergency Medicine

## 2013-08-31 ENCOUNTER — Emergency Department (HOSPITAL_COMMUNITY): Payer: Medicare Other

## 2013-08-31 ENCOUNTER — Ambulatory Visit (HOSPITAL_COMMUNITY): Payer: Medicare Other

## 2013-08-31 ENCOUNTER — Telehealth (HOSPITAL_COMMUNITY): Payer: Self-pay

## 2013-08-31 DIAGNOSIS — Z9889 Other specified postprocedural states: Secondary | ICD-10-CM | POA: Insufficient documentation

## 2013-08-31 DIAGNOSIS — R05 Cough: Secondary | ICD-10-CM | POA: Insufficient documentation

## 2013-08-31 DIAGNOSIS — R358 Other polyuria: Secondary | ICD-10-CM | POA: Insufficient documentation

## 2013-08-31 DIAGNOSIS — R059 Cough, unspecified: Secondary | ICD-10-CM | POA: Insufficient documentation

## 2013-08-31 DIAGNOSIS — Z79899 Other long term (current) drug therapy: Secondary | ICD-10-CM | POA: Insufficient documentation

## 2013-08-31 DIAGNOSIS — Z923 Personal history of irradiation: Secondary | ICD-10-CM | POA: Insufficient documentation

## 2013-08-31 DIAGNOSIS — N390 Urinary tract infection, site not specified: Secondary | ICD-10-CM | POA: Insufficient documentation

## 2013-08-31 DIAGNOSIS — I10 Essential (primary) hypertension: Secondary | ICD-10-CM | POA: Insufficient documentation

## 2013-08-31 DIAGNOSIS — R413 Other amnesia: Secondary | ICD-10-CM | POA: Insufficient documentation

## 2013-08-31 DIAGNOSIS — E119 Type 2 diabetes mellitus without complications: Secondary | ICD-10-CM | POA: Insufficient documentation

## 2013-08-31 DIAGNOSIS — N898 Other specified noninflammatory disorders of vagina: Secondary | ICD-10-CM | POA: Insufficient documentation

## 2013-08-31 DIAGNOSIS — C7951 Secondary malignant neoplasm of bone: Secondary | ICD-10-CM | POA: Insufficient documentation

## 2013-08-31 DIAGNOSIS — R3589 Other polyuria: Secondary | ICD-10-CM | POA: Insufficient documentation

## 2013-08-31 DIAGNOSIS — Z87828 Personal history of other (healed) physical injury and trauma: Secondary | ICD-10-CM | POA: Insufficient documentation

## 2013-08-31 DIAGNOSIS — R0602 Shortness of breath: Secondary | ICD-10-CM | POA: Insufficient documentation

## 2013-08-31 DIAGNOSIS — C7931 Secondary malignant neoplasm of brain: Secondary | ICD-10-CM | POA: Insufficient documentation

## 2013-08-31 DIAGNOSIS — C801 Malignant (primary) neoplasm, unspecified: Secondary | ICD-10-CM | POA: Insufficient documentation

## 2013-08-31 DIAGNOSIS — E0500 Thyrotoxicosis with diffuse goiter without thyrotoxic crisis or storm: Secondary | ICD-10-CM | POA: Insufficient documentation

## 2013-08-31 DIAGNOSIS — Z87891 Personal history of nicotine dependence: Secondary | ICD-10-CM | POA: Insufficient documentation

## 2013-08-31 DIAGNOSIS — IMO0002 Reserved for concepts with insufficient information to code with codable children: Secondary | ICD-10-CM | POA: Insufficient documentation

## 2013-08-31 DIAGNOSIS — Z853 Personal history of malignant neoplasm of breast: Secondary | ICD-10-CM | POA: Insufficient documentation

## 2013-08-31 LAB — URINALYSIS, ROUTINE W REFLEX MICROSCOPIC
Bilirubin Urine: NEGATIVE
Glucose, UA: >1000 mg/dL — AB
Leukocytes, UA: NEGATIVE
Nitrite: POSITIVE — AB
Specific Gravity, Urine: >1.03 — ABNORMAL HIGH (ref 1.005–1.030)
Urobilinogen, UA: 0.2 mg/dL (ref 0.0–1.0)
pH: 5.5 (ref 5.0–8.0)

## 2013-08-31 LAB — CBC WITH DIFFERENTIAL/PLATELET
Basophils Absolute: 0 10*3/uL (ref 0.0–0.1)
Basophils Relative: 0 % (ref 0–1)
Eosinophils Absolute: 0 10*3/uL (ref 0.0–0.7)
Eosinophils Relative: 0 % (ref 0–5)
HCT: 40.3 % (ref 36.0–46.0)
Hemoglobin: 12.6 g/dL (ref 12.0–15.0)
MCH: 31.1 pg (ref 26.0–34.0)
MCHC: 31.3 g/dL (ref 30.0–36.0)
MCV: 99.5 fL (ref 78.0–100.0)
Monocytes Absolute: 1.4 10*3/uL — ABNORMAL HIGH (ref 0.1–1.0)
Monocytes Relative: 6 % (ref 3–12)
RDW: 17.8 % — ABNORMAL HIGH (ref 11.5–15.5)

## 2013-08-31 LAB — BASIC METABOLIC PANEL
BUN: 27 mg/dL — ABNORMAL HIGH (ref 6–23)
Calcium: 10 mg/dL (ref 8.4–10.5)
Chloride: 96 mEq/L (ref 96–112)
Creatinine, Ser: 0.76 mg/dL (ref 0.50–1.10)
GFR calc Af Amer: >90 mL/min (ref >90)
GFR calc non Af Amer: 90 mL/min — ABNORMAL LOW (ref >90)

## 2013-08-31 LAB — PROTIME-INR
INR: 1.22 (ref 0.00–1.49)
Prothrombin Time: 15.1 s (ref 11.6–15.2)

## 2013-08-31 LAB — URINE MICROSCOPIC-ADD ON

## 2013-08-31 MED ORDER — SODIUM CHLORIDE 0.9 % IV BOLUS (SEPSIS)
1000.0000 mL | Freq: Once | INTRAVENOUS | Status: AC
  Administered 2013-08-31: 1000 mL via INTRAVENOUS

## 2013-08-31 MED ORDER — CIPROFLOXACIN HCL 500 MG PO TABS: 500.0000 mg | ORAL_TABLET | Freq: Two times a day (BID) | ORAL | Status: AC

## 2013-08-31 MED ORDER — DEXTROSE 5 % IV SOLN
1.0000 g | Freq: Once | INTRAVENOUS | Status: AC
  Administered 2013-08-31: 1 g via INTRAVENOUS
  Filled 2013-08-31: qty 10

## 2013-08-31 MED ORDER — IOHEXOL 300 MG/ML  SOLN
75.0000 mL | Freq: Once | INTRAMUSCULAR | Status: AC | PRN
  Administered 2013-08-31: 75 mL via INTRAVENOUS

## 2013-08-31 MED ORDER — HEPARIN SOD (PORK) LOCK FLUSH 100 UNIT/ML IV SOLN
INTRAVENOUS | Status: AC
  Administered 2013-08-31: 500 [IU]
  Filled 2013-08-31: qty 5

## 2013-08-31 NOTE — ED Notes (Signed)
Patient port a cath in left chest accessed using sterile technique. No blood return, but flushes fairly well.

## 2013-08-31 NOTE — ED Notes (Signed)
MD at bedside. 

## 2013-08-31 NOTE — Telephone Encounter (Signed)
Call from husband states "Kristen Parker tried to get up to go to the bathroom and she couldn't stand at all her knees gave away with her.  I'm going to call EMS and get them to bring her to the ED because I can't get her anywhere by myself."

## 2013-08-31 NOTE — ED Provider Notes (Addendum)
CSN: [id number]     Arrival date & time 08/31/13  1332 History  This chart was scribed for Roney Marion, MD by Blanchard Kelch, ED Scribe. The patient was seen in room APA19/APA19. Patient's care was started at 3:23 PM.    Chief Complaint  Patient presents with  . Fatigue    The history is provided by the patient. No language interpreter was used.    HPI Comments: Kristen Parker is a 62 y.o. female  who presents to the Emergency Department complaining of constant fatigue that recently worsened three days ago. She states she also has weakness in her extremities. She is unable to get back up out of a chair or bed without assistance. She reports a fall that occurred two weeks ago in which she hit her forehead while [place] the beach. Her husband reports she has had some slight short-term memory loss recently. She has a history of breast metatheses that spread to her bones, which was first diagnosed August 11, 2008. She was recently diagnosed with brain metatheses a week ago when she got an MRI done. She has been getting chemo every two weeks since diagnosed. She is on a break for two weeks but is still currently receiving treatment. She is on Decadron (2 tablets) twice a day. She has an MRI scheduled for tomorrow morning and will see her neurosurgeon on 10/3 to discuss a treatment plan for her brain metatheses. She complains of shortness of breath, non-productive cough, slight polyuria, and vaginal bleeding that began last night. She denies fever, chest pain, abdominal pain, nausea, vomiting, appetite changes, diarrhea, decreased urine output, dysuria, or leg swelling.  Past Medical History  Diagnosis Date  . Diabetes mellitus   . Graves disease   . Cancer     breast  . Breast CA 05/22/2011  . Thyroid disease     Graves disease/iodine ablation 16 yrs ago  . Hypertension   . Radiation 11/29/09-12/20/09    T-spine to T-6-upper T11  . Bone metastases     T-spine  . Bone metastases 06/20/2011   . 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 History of radiation therapy 10/06/08-11/28/08    R breast  . Metastasis to brain 08/25/2013   Past Surgical History  Procedure Laterality Date  . Breast biopsy      left  . Axillary node dissection      biopsy  . Breast biopsy      right  . Eye surgery      laser retinal tears  . Acdf    . Cervical disc surgery      ant. fusion with plate and screws  . Cholecystectomy    . Cesarean section    . Tubal ligation    . Reproductive      Removal of rt reproductive system  . Dilatation and currettage    . Mastectomy      rt. radical  29 lymph nodes  . Portacath placement  2009  . Appendectomy    . Partial hysterectomy     Family History  Problem Relation Age of Onset  . Cancer Father     Lung, Prostate  . COPD Father   . Heart disease Father     Congestive heart failure  . Cancer Mother     Cervical  . Cancer Paternal Aunt     Breast  . Cancer Paternal Aunt     Ovarian   History  Substance Use Topics  .  Smoking status: Former Smoker -- 0.50 packs/day for 30 years    Types: Cigarettes    Quit date: 08/10/2008  . Smokeless tobacco: Never Used  . Alcohol Use: No   OB History   Grav Para Term Preterm Abortions TAB SAB Ect Mult Living   2 2 2       2      Review of Systems  Constitutional: Positive for fatigue. Negative for fever, chills, diaphoresis and appetite change.  HENT: Negative for sore throat, mouth sores and trouble swallowing.   Eyes: Negative for visual disturbance.  Respiratory: Positive for cough and shortness of breath. Negative for chest tightness and wheezing.   Cardiovascular: Negative for chest pain.  Gastrointestinal: Negative for nausea, vomiting, abdominal pain, diarrhea and abdominal distention.  Endocrine: Positive for polyuria. Negative for polydipsia and polyphagia.  Genitourinary: Positive for vaginal bleeding. Negative for dysuria, frequency and hematuria.   Musculoskeletal: Negative for gait problem.  Skin: Negative for color change, pallor and rash.  Neurological: Positive for weakness. Negative for dizziness, syncope, light-headedness and headaches.  Hematological: Does not bruise/bleed easily.  Psychiatric/Behavioral: Negative for behavioral problems and confusion.  All other systems reviewed and are negative.    Allergies  Betadine; Erythromycin; Nsaids; and Tape  Home Medications   Current Outpatient Rx  Name  Route  Sig  Dispense  Refill  . Calcium Carb-Cholecalciferol (CALCIUM 1000 + D PO)   Oral   Take 1 tablet by mouth daily.         . Carboxymethylcellulose Sodium (REFRESH LIQUIGEL OP)   Ophthalmic   Apply to eye at bedtime. Coats eyelids @@HS          . Cholecalciferol (VITAMIN D PO)   Oral   Take 1,000 Units by mouth daily.           . clonazePAM (KLONOPIN) 0.5 MG tablet   Oral   Take 0.5-1 mg by mouth at bedtime.         Marland Kitchen dexamethasone (DECADRON) 4 MG tablet   Oral   Take 2 tablets (8 mg total) by mouth 2 (two) times daily with a meal.   90 tablet   1   . Docusate Calcium (STOOL SOFTENER PO)   Oral   Take 1 capsule by mouth daily as needed (taking 2 - 4 daily).          . Fesoterodine Fumarate (TOVIAZ) 8 MG TB24   Oral   Take 8 mg by mouth daily.         Marland Kitchen HYDROcodone-acetaminophen (NORCO/VICODIN) 5-325 MG per tablet   Oral   Take 1 tablet by mouth every 4 (four) hours as needed for pain.   100 tablet   0   . levothyroxine (SYNTHROID, LEVOTHROID) 125 MCG tablet   Oral   Take 125 mcg by mouth daily.           Marland Kitchen lisinopril (PRINIVIL,ZESTRIL) 10 MG tablet   Oral   Take 10 mg by mouth daily.           Marland Kitchen LORazepam (ATIVAN) 1 MG tablet   Oral   Take 1 mg by mouth every 4 (four) hours as needed. Nausea/vomiting         . megestrol (MEGACE) 40 MG tablet   Oral   Take 1 tablet (40 mg total) by mouth daily.   30 tablet   3   . metFORMIN (GLUCOPHAGE) 500 MG tablet   Oral    Take 500 mg by mouth daily.         Marland Kitchen  mirabegron ER (MYRBETRIQ) 25 MG TB24   Oral   Take 25 mg by mouth daily.         Marland Kitchen omeprazole (PRILOSEC) 20 MG capsule   Oral   Take 1 capsule (20 mg total) by mouth daily.   30 capsule   5   . potassium chloride SA (K-DUR,KLOR-CON) 20 MEQ tablet   Oral   Take 1 tablet (20 mEq total) by mouth 2 (two) times daily.   60 tablet   5   . SitaGLIPtin-MetFORMIN HCl (JANUMET XR) [phone] MG TB24   Oral   Take 1 tablet by mouth daily. Per Dr. Regino Schultze, pt states she is to take 1 -2 tablets daily depending on blood sugars, while on steroids         . triamterene-hydrochlorothiazide (DYAZIDE) 37.5-25 MG per capsule   Oral   Take 1 each (1 capsule total) by mouth 3 (three) times a week. Taking Mondays, Wednesdays and Fridays   30 capsule   3   . venlafaxine (EFFEXOR) 75 MG tablet   Oral   Take 1 tablet (75 mg total) by mouth at bedtime.   30 tablet   3   . Zoledronic Acid (ZOMETA IV)   Intravenous   Inject into the vein every 30 (thirty) days.           . ciprofloxacin (CIPRO) 500 MG tablet   Oral   Take 1 tablet (500 mg total) by mouth every 12 (twelve) hours.   20 tablet   0   . CLOBETASOL PROP EMOLLIENT BASE EX   Apply externally   Apply 1 application topically as needed (for irritation).          . Diphenhyd-HC-Nystatin-Tetracyc (FIRST-MARYS MOUTHWASH) SUSP   Oral   Take 5 mLs by mouth 4 (four) times daily as needed.   300 mL   3   . magnesium hydroxide (PHILLIPS CHEWS) 311 MG CHEW   Oral   Chew 311 mg by mouth as needed.         Bertram Gala Glycol-Propyl Glycol (SYSTANE) 0.4-0.3 % SOLN   Ophthalmic   Apply 2 drops to eye.          Triage Vitals: BP 129/62  Pulse 116  Temp(Src) 98.9 F (37.2 C) (Oral)  Resp 18  Ht 5\' 6"  (1.676 m)  Wt 214 lb (97.07 kg)  BMI 34.56 kg/m2  SpO2 94%  Physical Exam  Nursing note and vitals reviewed. Constitutional: She is oriented to person, place, and time. She appears  well-developed and well-nourished. No distress.  HENT:  Head: Normocephalic.  Some white exudate present in right posterior pharynx.  Eyes: Conjunctivae are normal. Pupils are equal, round, and reactive to light. No scleral icterus.  3mm reactive bilaterally.  Neck: Normal range of motion. Neck supple. No thyromegaly present.  Cardiovascular: Normal rate, regular rhythm and intact distal pulses.  Exam reveals no gallop and no friction rub.   No murmur heard. Pulmonary/Chest: Effort normal and breath sounds normal. No respiratory distress. She has no wheezes. She has no rales.  Abdominal: Soft. Bowel sounds are normal. She exhibits no distension. There is no tenderness. There is no rebound.  Musculoskeletal: Normal range of motion. She exhibits no edema.  Neurological: She is alert and oriented to person, place, and time.  Right lower extremity slightly weaker compared to left. Tremulous with right upper extremity. No pronator drift.   Skin: Skin is warm and dry. No rash noted.  Port site in  left chest looks good.   Psychiatric: She has a normal mood and affect. Her behavior is normal.    ED Course  Procedures (including critical care time)  DIAGNOSTIC STUDIES: Oxygen Saturation is 94% on room air, adequate by my interpretation.    COORDINATION OF CARE:  3:34 PM -Patient is dehydrated. Will order IV fluids, chest x-ray, head CT, EKG, CBC, BMP, urine culture, and urinalysis. Patient verbalizes understanding and agrees with treatment plan.   Labs Review Labs Reviewed  CBC WITH DIFFERENTIAL - Abnormal; Notable for the following:    WBC 22.3 (*)    RDW 17.8 (*)    Neutrophils Relative % 89 (*)    Neutro Abs 19.8 (*)    Lymphocytes Relative 5 (*)    Monocytes Absolute 1.4 (*)    All other components within normal limits  BASIC METABOLIC PANEL - Abnormal; Notable for the following:    Sodium 134 (*)    Glucose, Bld 356 (*)    BUN 27 (*)    GFR calc non Af Amer 90 (*)    All  other components within normal limits  URINALYSIS, ROUTINE W REFLEX MICROSCOPIC - Abnormal; Notable for the following:    APPearance HAZY (*)    Specific Gravity, Urine >1.030 (*)    Glucose, UA >1000 (*)    Hgb urine dipstick LARGE (*)    Ketones, ur TRACE (*)    Protein, ur TRACE (*)    Nitrite POSITIVE (*)    All other components within normal limits  URINE MICROSCOPIC-ADD ON - Abnormal; Notable for the following:    Bacteria, UA MANY (*)    All other components within normal limits  URINE CULTURE  PROTIME-INR   Imaging Review  Ct Head W Wo Contrast  08/31/2013   CLINICAL DATA:  Metastatic right breast cancer, new brain met on recent MRI, falling, dizziness, headache  EXAM: CT HEAD WITHOUT AND WITH CONTRAST  TECHNIQUE: Contiguous axial images were obtained from the base of the skull through the vertex without and with intravenous contrast  CONTRAST:  75mL OMNIPAQUE IOHEXOL 300 MG/ML  SOLN  COMPARISON:  MRI brain dated 08/25/2013  FINDINGS: Known right inferior cerebellar metastasis is very poorly visualized on post-contrast CT (series 4/image 4). No associated vasogenic edema or mass effect within the posterior fossa.  Tiny parafalcine meningioma along the right aspect of the anterior interhemispheric fissure (series 2/image 18). No midline shift. Basal cisterns are patent.  No CT evidence of acute infarction.  Subcortical white matter and periventricular small vessel ischemic changes.  Mild cortical atrophy with secondary ventricular prominence.  The visualized paranasal sinuses are essentially clear. The mastoid air cells are unopacified.  No evidence of calvarial fracture. Stable diffuse heterogeneous sclerotic osseous metastases.  IMPRESSION: Known right inferior cerebellar metastasis is very poorly visualized on post-contrast CT. No associated vasogenic edema or mass effect within the posterior fossa.  Stable diffuse heterogeneous sclerotic osseous metastases.  Normal interval change from  recent MRI.   Electronically Signed   By: Charline Bills M.D.   On: 08/31/2013 17:30   Dg Chest Port 1 View  08/31/2013   CLINICAL DATA:  Fatigue and history of breast carcinoma.  EXAM: PORTABLE CHEST - 1 VIEW  COMPARISON:  06/25/2012  FINDINGS: There is stable positioning of a porta cath in the upper SVC. Lungs show low volumes and no evidence of overt edema, focal consolidation or pleural effusion. The heart size and mediastinal contours are stable and unremarkable.  IMPRESSION:  No active disease.   Electronically Signed   By: Irish Lack   On: 08/31/2013 16:16    EKG: Indication weakness, tachycardia. Interpretation sinus tachycardia occasional PACs. No ventricular ectopy. No ST segment changes.  MDM   1. UTI (lower urinary tract infection)    Labs reassuring. Normal calcium. Chest x-ray normal. CT scan shows no change the posterior fossa mass with edema or hemorrhage. This with the caveat that this is not well visualized, however it is been information I need today and there is no edema or marked change. She is scheduled for MRI tomorrow for evaluation for gamma knife therapy. Given IV Rocephin here. She is quite desirous of being discharged home. Actually had the nurse come by me and that she wanted to go now. Prescription for Cipro. Followup with her primary care physician to recheck urine culture and sensitivity in 48 hours.   I personally performed the services described in this documentation, which was scribed in my presence. The recorded information has been reviewed and is accurate.    Roney Marion, MD 08/31/13 1753  Roney Marion, MD 08/31/13 [phone]

## 2013-08-31 NOTE — Telephone Encounter (Signed)
Call from husband states "Kristen Parker is really weak today. She fell this morning when she got up, said she did not hurt herself but now if she tries to walk (with lots of help) more than 4 - 5 steps she gets dizzy and will have to sit because she feels like she's going to faint."  When questioned husband states "she has not lost control of her bladder or bowels, but is just weak all over and sometimes her thinking isn't good." Is taking Decadron 8 mg BID.  Blood sugar is currently 363 and she has not taken her metformin yet (instructed to take metformin). Has appointment to see Rad/Onc tomorrow and for [facility] treatment on 10/3 with Rad/Onc and neurosurgeon.  Is also scheduled to come for labs and aranesp tomorrow and husband is not sure he can get her in the car to get her here.  Wants to know what to do?

## 2013-08-31 NOTE — ED Notes (Signed)
Patient is a cancer patient and has an appointment this afternoon.  Patient c/o generalized weakness; states too weak to walk.  Patient's family called doctor and was told to call EMS to be evaluated.

## 2013-09-01 ENCOUNTER — Ambulatory Visit
Admission: RE | Admit: 2013-09-01 | Discharge: 2013-09-01 | Disposition: A | Discharge status: Home / Self Care | Payer: Medicare Other | Source: Ambulatory Visit | Attending: Radiation Oncology | Admitting: Radiation Oncology

## 2013-09-01 ENCOUNTER — Encounter: Payer: Self-pay | Admitting: Radiation Oncology

## 2013-09-01 VITALS — BP 146/83 | HR 108 | Temp 98.1°F | Resp 20 | Ht 66.0 in | Wt 207.9 lb

## 2013-09-01 DIAGNOSIS — C7931 Secondary malignant neoplasm of brain: Secondary | ICD-10-CM

## 2013-09-01 DIAGNOSIS — Z79899 Other long term (current) drug therapy: Secondary | ICD-10-CM | POA: Insufficient documentation

## 2013-09-01 DIAGNOSIS — C50919 Malignant neoplasm of unspecified site of unspecified female breast: Secondary | ICD-10-CM | POA: Insufficient documentation

## 2013-09-01 HISTORY — DX: Personal history of irradiation: Z92.3

## 2013-09-01 MED ORDER — GADOBENATE DIMEGLUMINE 529 MG/ML IV SOLN: 20.0000 mL | Freq: Once | INTRAVENOUS | Status: DC | PRN

## 2013-09-01 MED ORDER — SODIUM CHLORIDE 0.9 % IJ SOLN: 10.0000 mL | Freq: Once | INTRAMUSCULAR | Status: DC

## 2013-09-01 MED ORDER — SODIUM CHLORIDE 0.9 % IJ SOLN
10.0000 mL | Freq: Once | INTRAMUSCULAR | Status: AC
  Administered 2013-09-01: 10 mL via INTRAVENOUS

## 2013-09-01 NOTE — Progress Notes (Signed)
Please see the Nurse Progress Note in the MD Initial Consult Encounter for this patient. 

## 2013-09-01 NOTE — Addendum Note (Signed)
Encounter addended by: Eduardo Osier, RN on: 09/01/2013  1:39 PM<BR>     Documentation filed: Notes Section, Inpatient Document Flowsheet

## 2013-09-01 NOTE — Progress Notes (Signed)
IV start in left lateral wrist by Chrystie Nose, RN on 2nd attempt with brisk blood return.  1st unsuccessful atttempt by Sonda Rumble, RN in the lower left lateral wrist region.  Flushed without any difficulty.  Area secured with tape and Op-site dressing.  Tolerated without difficulty  Patient seen prior to this by Dr. Mitzi Hansen and is having simulation today.

## 2013-09-01 NOTE — Progress Notes (Signed)
Radiation Oncology         [phone]) [phone] ________________________________  Name: ANAPAOLA KINSEL MRN: [id number]  Date: 09/01/2013  DOB: 09-07-1951  Reevaluation Visit Note  CC: [name], MD  Carmela Hurt, MD   Jenita Seashore, PA  Diagnosis:   Metastatic breast cancer  Narrative:  The patient returns today for    reevaluation today. The patient has been seen in our clinic previously and received her last radiation treatment to the thoracic spine which was completed in January of 2011. The patient has proceeded with systemic treatment [facility]. Overall she has done very well. The patient has a recent history of some lack of balance, vision changes, and headaches. The patient also has been having some confusion/difficulty with communication.  The patient was diagnosed with a urinary tract infection yesterday and she does state that she is feeling somewhat better today. She has had some generalized weakness.  Evaluation for these issues included an MRI scan of the brain on 08/25/2013. A 10 x 15 x 13 mm right cerebellar metastasis was seen with some mild surrounding vasogenic edema. A small subcentimeter. Falcine meningioma was also seen at this time. Given this finding we were informed of this change in her status and she was deemed to be a good radiosurgery candidate by myself and Dr. Mikal Plane.  The patient has been started on steroids and she is on a prescription now 48 mg Decadron twice a day.   The patient proceeded to undergo a SRS protocol  MRI scan of the brain earlier today. This again showed the dominant metastasis within the cerebellum. An additional 8 small/punctate lesions were seen. I therefore seeing the patient today for consideration of cranial radiation.                               ALLERGIES:  is allergic to betadine; erythromycin; nsaids; and tape.  Meds: Current Outpatient Prescriptions  Medication Sig Dispense Refill  . Calcium Carb-Cholecalciferol  (CALCIUM 1000 + D PO) Take 1 tablet by mouth daily.      . Carboxymethylcellulose Sodium (REFRESH LIQUIGEL OP) Apply to eye at bedtime. Coats eyelids @@HS       . Cholecalciferol (VITAMIN D PO) Take 1,000 Units by mouth daily.        . ciprofloxacin (CIPRO) 500 MG tablet Take 1 tablet (500 mg total) by mouth every 12 (twelve) hours.  20 tablet  0  . CLOBETASOL PROP EMOLLIENT BASE EX Apply 1 application topically as needed (for irritation).       . clonazePAM (KLONOPIN) 0.5 MG tablet Take 0.5-1 mg by mouth at bedtime.      Marland Kitchen dexamethasone (DECADRON) 4 MG tablet Take 2 tablets (8 mg total) by mouth 2 (two) times daily with a meal.  90 tablet  1  . Diphenhyd-HC-Nystatin-Tetracyc (FIRST-MARYS MOUTHWASH) SUSP Take 5 mLs by mouth 4 (four) times daily as needed.  300 mL  3  . Docusate Calcium (STOOL SOFTENER PO) Take 1 capsule by mouth daily as needed (taking 2 - 4 daily).       . Fesoterodine Fumarate (TOVIAZ) 8 MG TB24 Take 8 mg by mouth daily.      Marland Kitchen HYDROcodone-acetaminophen (NORCO/VICODIN) 5-325 MG per tablet Take 1 tablet by mouth every 4 (four) hours as needed for pain.  100 tablet  0  . levothyroxine (SYNTHROID, LEVOTHROID) 125 MCG tablet Take 125 mcg by mouth daily.        Marland Kitchen  lisinopril (PRINIVIL,ZESTRIL) 10 MG tablet Take 10 mg by mouth daily.        Marland Kitchen LORazepam (ATIVAN) 1 MG tablet Take 1 mg by mouth every 4 (four) hours as needed. Nausea/vomiting      . magnesium hydroxide (PHILLIPS CHEWS) 311 MG CHEW Chew 311 mg by mouth as needed.      . megestrol (MEGACE) 40 MG tablet Take 1 tablet (40 mg total) by mouth daily.  30 tablet  3  . mirabegron ER (MYRBETRIQ) 25 MG TB24 Take 25 mg by mouth daily.      Marland Kitchen omeprazole (PRILOSEC) 20 MG capsule Take 1 capsule (20 mg total) by mouth daily.  30 capsule  5  . Polyethyl Glycol-Propyl Glycol (SYSTANE) 0.4-0.3 % SOLN Apply 2 drops to eye.      . potassium chloride SA (K-DUR,KLOR-CON) 20 MEQ tablet Take 1 tablet (20 mEq total) by mouth 2 (two) times daily.  60  tablet  5  . SitaGLIPtin-MetFORMIN HCl (JANUMET XR) [phone] MG TB24 Take 1 tablet by mouth daily. Per Dr. Regino Schultze, pt states she is to take 1 -2 tablets daily depending on blood sugars, while on steroids      . venlafaxine (EFFEXOR) 75 MG tablet Take 1 tablet (75 mg total) by mouth at bedtime.  30 tablet  3  . Zoledronic Acid (ZOMETA IV) Inject into the vein every 30 (thirty) days.        . metFORMIN (GLUCOPHAGE) 500 MG tablet Take 500 mg by mouth daily.      Marland Kitchen triamterene-hydrochlorothiazide (DYAZIDE) 37.5-25 MG per capsule Take 1 each (1 capsule total) by mouth 3 (three) times a week. Taking Mondays, Wednesdays and Fridays  30 capsule  3   No current facility-administered medications for this encounter.   Facility-Administered Medications Ordered in Other Encounters  Medication Dose Route Frequency Provider Last Rate Last Dose  . gadobenate dimeglumine (MULTIHANCE) injection 20 mL  20 mL Intravenous Once PRN Medication Radiologist, MD      . sodium chloride 0.9 % injection 10 mL  10 mL Intravenous PRN Ellouise Newer, PA-C   10 mL at 03/06/12 1325    Physical Findings: The patient is in no acute distress. Patient is alert and oriented.  height is 5\' 6"  (1.676 m) and weight is 207 lb 14.4 oz (94.303 kg). Her oral temperature is 98.1 F (36.7 C). Her blood pressure is 146/83 and her pulse is 108. Her respiration is 20. Marland Kitchen   General: Well-developed, in no acute distress, the patient had some difficulty focusing on one topic to completion. Some apparent expressive aphasia. HEENT: Normocephalic, atraumatic, a mild case of thrush is present. The patient is on Magic mouthwash with nystatin at this time Cardiovascular: Regular rate and rhythm Respiratory: Clear to auscultation bilaterally GI: Soft, nontender, normal bowel sounds Extremities: No edema present Neuro: The patient has fairly good symmetrical strength in the upper extremities bilaterally. Greater proximal lower extremity  weakness on  the right than the left; 3-4/5 vs. 4+/5. Dorsiflexion and plantar flexion 5 out of 5 bilaterally.   Lab Findings: Lab Results  Component Value Date   WBC 22.3* 08/31/2013   HGB 12.6 08/31/2013   HCT 40.3 08/31/2013   MCV 99.5 08/31/2013   PLT 354 08/31/2013     Radiographic Findings: Ct Head W Wo Contrast  08/31/2013   CLINICAL DATA:  Metastatic right breast cancer, new brain met on recent MRI, falling, dizziness, headache  EXAM: CT HEAD WITHOUT AND WITH CONTRAST  TECHNIQUE: Contiguous axial images were obtained from the base of the skull through the vertex without and with intravenous contrast  CONTRAST:  75mL OMNIPAQUE IOHEXOL 300 MG/ML  SOLN  COMPARISON:  MRI brain dated 08/25/2013  FINDINGS: Known right inferior cerebellar metastasis is very poorly visualized on post-contrast CT (series 4/image 4). No associated vasogenic edema or mass effect within the posterior fossa.  Tiny parafalcine meningioma along the right aspect of the anterior interhemispheric fissure (series 2/image 18). No midline shift. Basal cisterns are patent.  No CT evidence of acute infarction.  Subcortical white matter and periventricular small vessel ischemic changes.  Mild cortical atrophy with secondary ventricular prominence.  The visualized paranasal sinuses are essentially clear. The mastoid air cells are unopacified.  No evidence of calvarial fracture. Stable diffuse heterogeneous sclerotic osseous metastases.  IMPRESSION: Known right inferior cerebellar metastasis is very poorly visualized on post-contrast CT. No associated vasogenic edema or mass effect within the posterior fossa.  Stable diffuse heterogeneous sclerotic osseous metastases.  Normal interval change from recent MRI.   Electronically Signed   By: Charline Bills M.D.   On: 08/31/2013 17:30   Mr Laqueta Jean ZO Contrast  09/01/2013   CLINICAL DATA:  S RS targeting. Metastatic breast cancer.  EXAM: MRI HEAD WITHOUT AND WITH CONTRAST  TECHNIQUE: Multiplanar,  multiecho pulse sequences of the brain and surrounding structures were obtained according to standard protocol without and with intravenous contrast  CONTRAST:  20 cc MultiHance  COMPARISON:  08/25/2013. 09/08/2008.  FINDINGS: There is a dominant metastatic lesion at the inferior cerebellum on the right measuring 11 x 15 mm. Minimal associated vasogenic edema. Using this technique, we can identify an additional 4 punctate metastases within the cerebellum as marked on axial images 31, 47, 55 and 65.  Within the left cerebral hemisphere, I can identify a single punctate metastasis in the occipital lobe on image 51.  Within the right cerebral hemisphere, I can identify a punctate metastasis in the temporal lobe on image 56, a punctate metastasis in the parietal lobe on image 82 and a punctate metastasis in the parietal lobe at the vertex on image 128.  Small falcine meningioma towards the vertex measuring 7 mm is unchanged.  No acute infarction. No hydrocephalus. Chronic small-vessel changes affect the deep white matter. No extra-axial fluid. The patient has chronic sclerotic changes of the calvarium, skull base and cervical spine related to known osseous metastatic disease.  IMPRESSION: This examination re- demonstrates the inferior cerebellar metastasis on the right measuring 15 x 11 mm with minimal vasogenic edema. Additionally demonstrated are 4 more punctate cerebellar metastases, a single punctate left occipital metastasis, and 3 punctate right cerebral hemispheric metastases.   Electronically Signed   By: Paulina Fusi M.D.   On: 09/01/2013 11:07   Mr Laqueta Jean XW Contrast  08/25/2013   *RADIOLOGY REPORT*  Clinical Data: Mental status changes.  Changes in ambulation. Breast cancer.  MRI HEAD WITHOUT AND WITH CONTRAST  Technique:  Multiplanar, multiecho pulse sequences of the brain and surrounding structures were obtained according to standard protocol without and with intravenous contrast  Contrast: 20mL  MULTIHANCE GADOBENATE DIMEGLUMINE 529 MG/ML IV SOLN  Comparison:  09/08/2008.  Findings: No acute stroke or hemorrhage.  No hydrocephalus or extra- axial fluid.  Moderate atrophy.  Chronic microvascular ischemic change in the periventricular and subcortical white matter. Multiple sclerotic lesions in the calvarium and upper cervical vertebrae reflect known osseous metastatic disease.  Post infusion imaging demonstrates a new abnormality in  the right inferior cerebellar hemisphere representing a solid metastasis. Cross-sectional measurements are 10 x 15 x 13 mm.  There is mild surrounding vasogenic edema but no midline shift or incipient herniation.  No other areas of abnormal enhancement are seen except for a small parafalcine meningioma in the interhemispheric fissure of the frontal lobe on the right of doubtful significance measuring 6 x 6 x 3 mm.  Compared with priors, no metastases were seen.  Bone metastases were present.  Chronic microvascular ischemic change has progressed.  Small meningioma is stable.  IMPRESSION: 10 x 15 x 13 mm right inferior cerebellar hemispheric solitary metastasis.  Progression of atrophy and chronic microvascular ischemic change.  Incidental interhemispheric meningioma, stable.  Sclerotic bone lesions representing treated osseous metastases.   Original Report Authenticated By: Davonna Belling, M.D.   Nm Pet Image Restag (ps) Skull Base To Thigh  08/16/2013   *RADIOLOGY REPORT*  Clinical Data:  Subsequent treatment strategy for breast cancer.  NUCLEAR MEDICINE PET WHOLE BODY  Fasting Blood Glucose:  139  Technique:  17.1 mCi F-18 FDG was injected intravenously. CT data was obtained and used for attenuation correction and anatomic localization only.  (This was not acquired as a diagnostic CT examination.) Additional exam technical data entered on technologist worksheet.  Comparison:  10/06/2012  Findings:  Head/Neck:   No hypermetabolic lymph nodes in the neck.  Chest:   No  hypermetabolic mediastinal or hilar nodes.  There is focal hypermetabolism associated with the patient's left nipple. SUV max = 7.   No suspicious pulmonary nodules on the CT scan.  Abdomen/Pelvis:  No abnormal hypermetabolic activity within the solid organs.  No evidence of abdominal or pelvic hypermetabolic lymph nodes.  Skeleton:  As before, there are diffuse hypermetabolic lesions throughout the skeleton.  The index lesion identified previously in the L3 vertebral body remains hypermetabolic with SUV max = 8 today compared to 7 previously.  Right sacral hypermetabolic lesion has SUV max = 7.  A hypermetabolic lesion in the sternum has SUV max = 6.  Extremities:  No hypermetabolic activity to suggest metastasis.  IMPRESSION: Focal hypermetabolism associate with the patient's left nipple.  No definite underlying mass lesion is evident.  This should be amenable to further clinical evaluation.  Persistent scattered osseous metastases as before.  Bony disease shows no substantial interval change.   Original Report Authenticated By: Kennith Center, M.D.   Dg Chest Port 1 View  08/31/2013   CLINICAL DATA:  Fatigue and history of breast carcinoma.  EXAM: PORTABLE CHEST - 1 VIEW  COMPARISON:  06/25/2012  FINDINGS: There is stable positioning of a porta cath in the upper SVC. Lungs show low volumes and no evidence of overt edema, focal consolidation or pleural effusion. The heart size and mediastinal contours are stable and unremarkable.  IMPRESSION: No active disease.   Electronically Signed   By: Irish Lack   On: 08/31/2013 16:16    Impression:    The patient has a history of metastatic rest cancer, now with brain metastasis. She has a 15 mm right cerebellar lesion with any additional punctate lesions. I discussed possible cranial radiation with the patient, whole brain radiotherapy versus radiosurgery. I believe that a course of radiosurgery is feasible in 2 sessions 2 target all of the lesions. I also believe  that this would be reasonable for her given her favorable course overall until recently. The patient's husband stresses that she has been very active until this most recent setback. In fact they  were planning to go to [facility] later this month but this has been postponed.  We therefore discussed a possible course of radiosurgery. We discussed the possible side effects and risks of treatment in addition to the expected benefits. We discussed the logistics of treatment and patient is scheduled for a simulation later today such that we can begin treatment planning. All of her questions were answered. The patient does wish to proceed with treatment as above at this time.  Plan:  Simulation today for her planning for radiosurgery. We will treat the 9 lesions in 2 separate sessions within the next approximate 1 week.  I spent 40 minutes with the patient today, the majority of which was spent counseling the patient on the diagnosis of cancer and coordinating care.   [name], M.D., Ph.D.

## 2013-09-01 NOTE — Progress Notes (Signed)
Removed #22 IV intact from patient's left wrist.  Site did not have any bleeding or redness.  Pressure and bandaid applied.

## 2013-09-02 NOTE — Progress Notes (Signed)
[facility] Psychosocial Distress Screening Clinical Social Work  Clinical Social Work was referred by distress screening protocol.  The patient scored a 9 on the Psychosocial Distress Thermometer which indicates severe distress. Clinical Social Worker telephoned to assess for distress and other psychosocial needs. Patient did not answer phone and message was left to Clinical Social Worker Intern if there was anything Patient would like to discuss.   Clinical Social Worker follow up needed: no  If yes, follow up plan:   Celeste Tavenner [facility] Clinical Social Work Intern [employer] [facility] [phone]

## 2013-09-03 ENCOUNTER — Ambulatory Visit
Admission: RE | Admit: 2013-09-03 | Discharge: 2013-09-03 | Disposition: A | Discharge status: Home / Self Care | Payer: Medicare Other | Source: Ambulatory Visit | Attending: Radiation Oncology | Admitting: Radiation Oncology

## 2013-09-03 ENCOUNTER — Telehealth: Payer: Self-pay | Admitting: Oncology

## 2013-09-03 ENCOUNTER — Other Ambulatory Visit: Payer: Self-pay | Admitting: Radiation Therapy

## 2013-09-03 ENCOUNTER — Ambulatory Visit: Payer: Medicare Other

## 2013-09-03 ENCOUNTER — Ambulatory Visit (HOSPITAL_COMMUNITY): Payer: Medicare Other

## 2013-09-03 VITALS — BP 125/65 | HR 114 | Temp 98.0°F

## 2013-09-03 DIAGNOSIS — C50919 Malignant neoplasm of unspecified site of unspecified female breast: Secondary | ICD-10-CM

## 2013-09-03 DIAGNOSIS — C7931 Secondary malignant neoplasm of brain: Secondary | ICD-10-CM

## 2013-09-03 LAB — CREATININE, SERUM: Creatinine, Ser: 0.83 mg/dL (ref 0.50–1.10)

## 2013-09-03 MED ORDER — LORAZEPAM 1 MG PO TABS: 1.0000 mg | ORAL_TABLET | ORAL | Status: AC

## 2013-09-03 NOTE — Progress Notes (Signed)
Patient is alert and oriented to person, place and time.  She denies pain, headache, nausea, blurred vision and dizziness.  Her family is with her and call light is in reach.

## 2013-09-03 NOTE — Progress Notes (Addendum)
Kristen Parker here for post [facility] monitoring.  She is oriented to person and place.  She was unable to state her birth year.  She denies headache, pain, nausea, blurred vision and dizziness.  She is resting in a wheelchair.  Her family is with her.  Call light in reach.  She was given orange juice.  Will continue to monitor.

## 2013-09-03 NOTE — Telephone Encounter (Signed)
Called and left a message for Kristen Parker letting her know that she should resume taking her metformin per Dr. Mitzi Hansen.  Her bun was 29 today compared to 27 on 08/31/13.  Creatinine was 0.83.  Lab results reviewed with Dr. Mitzi Hansen.

## 2013-09-04 ENCOUNTER — Telehealth (HOSPITAL_COMMUNITY): Payer: Self-pay

## 2013-09-04 LAB — URINE CULTURE: Colony Count: >=100000

## 2013-09-04 NOTE — Telephone Encounter (Signed)
Call from Kristen Parker stated "Kristen Parker had first [facility] treatment yesterday and is to have her next one on Tuesday.  Just wanted to know what I needed to do to get someone to help me out  with her like maybe Home Health.  She's still very weak and can't walk by herself or do much by herself at all.  She fell again last night going to the bathroom.  I've been sleeping in a recliner to try and make sure I hear her when she tries to get up and I didn't hear her get up and she fell.  If I could get someone to help me bathe her too.  I'm trying but I know I'm not doing as good a job as she would like.  She's still staggering and her thoughts are still all mixed up, she worse than she was when y'all saw her and I'm still giving her the steroids and the antibiotic.  Dr. Mitzi Hansen said he felt like she was on enough steroids for now.  If you can talk with the doctor Monday morning to see what we can do."    Will discuss with Dellis Anes, PA-C, Monday morning and let Kristen Parker know what we can do.

## 2013-09-05 ENCOUNTER — Telehealth (HOSPITAL_COMMUNITY): Payer: Self-pay | Admitting: Emergency Medicine

## 2013-09-05 NOTE — ED Notes (Signed)
Post ED Visit - Positive Culture Follow-up  Culture report reviewed by antimicrobial stewardship pharmacist: []  Wes Dulaney, Pharm.D., BCPS []  Celedonio Miyamoto, Pharm.D., BCPS []  Georgina Pillion, Pharm.D., BCPS []  [place], [address].D., BCPS, AAHIVP []  Estella Husk, Pharm.D., BCPS, AAHIVP [x]  Abran Duke, [address].D., BCPS  Positive urine culture Treated with Cipro, organism sensitive to the same and no further patient follow-up is required at this time.  Kylie A Holland 09/05/2013, 2:15 PM

## 2013-09-06 ENCOUNTER — Ambulatory Visit: Payer: Medicare Other | Admitting: Radiation Oncology

## 2013-09-06 ENCOUNTER — Encounter (HOSPITAL_COMMUNITY): Payer: Self-pay | Admitting: Oncology

## 2013-09-07 ENCOUNTER — Ambulatory Visit: Admission: RE | Admit: 2013-09-07 | Payer: Medicare Other | Source: Ambulatory Visit | Admitting: Radiation Oncology

## 2013-09-08 ENCOUNTER — Inpatient Hospital Stay (HOSPITAL_COMMUNITY)
Admission: EM | Admit: 2013-09-08 | Discharge: 2013-10-02 | DRG: 054 | Disposition: E | Payer: Medicare Other | Attending: Internal Medicine | Admitting: Internal Medicine

## 2013-09-08 ENCOUNTER — Encounter (HOSPITAL_COMMUNITY): Payer: Self-pay | Admitting: Emergency Medicine

## 2013-09-08 ENCOUNTER — Emergency Department (HOSPITAL_COMMUNITY): Payer: Medicare Other

## 2013-09-08 DIAGNOSIS — Z87440 Personal history of urinary (tract) infections: Secondary | ICD-10-CM

## 2013-09-08 DIAGNOSIS — Z923 Personal history of irradiation: Secondary | ICD-10-CM

## 2013-09-08 DIAGNOSIS — D496 Neoplasm of unspecified behavior of brain: Secondary | ICD-10-CM

## 2013-09-08 DIAGNOSIS — E119 Type 2 diabetes mellitus without complications: Secondary | ICD-10-CM | POA: Diagnosis present

## 2013-09-08 DIAGNOSIS — C7949 Secondary malignant neoplasm of other parts of nervous system: Secondary | ICD-10-CM | POA: Diagnosis present

## 2013-09-08 DIAGNOSIS — Z515 Encounter for palliative care: Secondary | ICD-10-CM

## 2013-09-08 DIAGNOSIS — C50919 Malignant neoplasm of unspecified site of unspecified female breast: Secondary | ICD-10-CM | POA: Diagnosis present

## 2013-09-08 DIAGNOSIS — C7931 Secondary malignant neoplasm of brain: Secondary | ICD-10-CM | POA: Diagnosis present

## 2013-09-08 DIAGNOSIS — E876 Hypokalemia: Secondary | ICD-10-CM | POA: Diagnosis not present

## 2013-09-08 DIAGNOSIS — R5383 Other fatigue: Secondary | ICD-10-CM

## 2013-09-08 DIAGNOSIS — E0500 Thyrotoxicosis with diffuse goiter without thyrotoxic crisis or storm: Secondary | ICD-10-CM | POA: Diagnosis present

## 2013-09-08 DIAGNOSIS — Z8249 Family history of ischemic heart disease and other diseases of the circulatory system: Secondary | ICD-10-CM

## 2013-09-08 DIAGNOSIS — E236 Other disorders of pituitary gland: Secondary | ICD-10-CM | POA: Diagnosis present

## 2013-09-08 DIAGNOSIS — Z87891 Personal history of nicotine dependence: Secondary | ICD-10-CM

## 2013-09-08 DIAGNOSIS — T380X5A Adverse effect of glucocorticoids and synthetic analogues, initial encounter: Secondary | ICD-10-CM | POA: Diagnosis present

## 2013-09-08 DIAGNOSIS — R000 Tachycardia, unspecified: Secondary | ICD-10-CM | POA: Diagnosis present

## 2013-09-08 DIAGNOSIS — T451X5A Adverse effect of antineoplastic and immunosuppressive drugs, initial encounter: Secondary | ICD-10-CM | POA: Diagnosis present

## 2013-09-08 DIAGNOSIS — D6481 Anemia due to antineoplastic chemotherapy: Secondary | ICD-10-CM | POA: Diagnosis present

## 2013-09-08 DIAGNOSIS — E222 Syndrome of inappropriate secretion of antidiuretic hormone: Secondary | ICD-10-CM | POA: Diagnosis present

## 2013-09-08 DIAGNOSIS — E669 Obesity, unspecified: Secondary | ICD-10-CM | POA: Diagnosis present

## 2013-09-08 DIAGNOSIS — G9340 Encephalopathy, unspecified: Secondary | ICD-10-CM | POA: Diagnosis present

## 2013-09-08 DIAGNOSIS — R4182 Altered mental status, unspecified: Secondary | ICD-10-CM

## 2013-09-08 DIAGNOSIS — I10 Essential (primary) hypertension: Secondary | ICD-10-CM | POA: Diagnosis present

## 2013-09-08 DIAGNOSIS — I639 Cerebral infarction, unspecified: Secondary | ICD-10-CM | POA: Diagnosis present

## 2013-09-08 DIAGNOSIS — Z9010 Acquired absence of unspecified breast and nipple: Secondary | ICD-10-CM

## 2013-09-08 DIAGNOSIS — Z79899 Other long term (current) drug therapy: Secondary | ICD-10-CM

## 2013-09-08 DIAGNOSIS — Z853 Personal history of malignant neoplasm of breast: Secondary | ICD-10-CM

## 2013-09-08 DIAGNOSIS — Z66 Do not resuscitate: Secondary | ICD-10-CM | POA: Diagnosis present

## 2013-09-08 DIAGNOSIS — E871 Hypo-osmolality and hyponatremia: Secondary | ICD-10-CM | POA: Diagnosis present

## 2013-09-08 DIAGNOSIS — I6350 Cerebral infarction due to unspecified occlusion or stenosis of unspecified cerebral artery: Secondary | ICD-10-CM | POA: Diagnosis present

## 2013-09-08 DIAGNOSIS — C7951 Secondary malignant neoplasm of bone: Secondary | ICD-10-CM | POA: Diagnosis present

## 2013-09-08 DIAGNOSIS — D72829 Elevated white blood cell count, unspecified: Secondary | ICD-10-CM | POA: Diagnosis present

## 2013-09-08 LAB — URINALYSIS, ROUTINE W REFLEX MICROSCOPIC
Bilirubin Urine: NEGATIVE
Glucose, UA: >1000 mg/dL — AB
Hgb urine dipstick: NEGATIVE
Ketones, ur: 15 mg/dL — AB
Nitrite: NEGATIVE
Protein, ur: NEGATIVE mg/dL
Urobilinogen, UA: 0.2 mg/dL (ref 0.0–1.0)

## 2013-09-08 LAB — COMPREHENSIVE METABOLIC PANEL
ALT: 13 U/L (ref 0–35)
AST: 19 U/L (ref 0–37)
Albumin: 3.2 g/dL — ABNORMAL LOW (ref 3.5–5.2)
Alkaline Phosphatase: 118 U/L — ABNORMAL HIGH (ref 39–117)
CO2: 23 mEq/L (ref 19–32)
Chloride: 93 mEq/L — ABNORMAL LOW (ref 96–112)
GFR calc Af Amer: >90 mL/min (ref >90)
Potassium: 4.3 mEq/L (ref 3.5–5.1)
Total Bilirubin: 0.4 mg/dL (ref 0.3–1.2)

## 2013-09-08 LAB — CBC WITH DIFFERENTIAL/PLATELET
Basophils Absolute: 0 10*3/uL (ref 0.0–0.1)
Basophils Relative: 0 % (ref 0–1)
Hemoglobin: 13 g/dL (ref 12.0–15.0)
Lymphocytes Relative: 4 % — ABNORMAL LOW (ref 12–46)
MCHC: 32.5 g/dL (ref 30.0–36.0)
Monocytes Relative: 2 % — ABNORMAL LOW (ref 3–12)
Neutro Abs: 24.8 10*3/uL — ABNORMAL HIGH (ref 1.7–7.7)
Neutrophils Relative %: 93 % — ABNORMAL HIGH (ref 43–77)
RDW: 17 % — ABNORMAL HIGH (ref 11.5–15.5)
WBC: 26.5 10*3/uL — ABNORMAL HIGH (ref 4.0–10.5)

## 2013-09-08 LAB — GLUCOSE, CAPILLARY: Glucose-Capillary: 307 mg/dL — ABNORMAL HIGH (ref 70–99)

## 2013-09-08 LAB — URINE MICROSCOPIC-ADD ON

## 2013-09-08 LAB — LACTIC ACID, PLASMA: Lactic Acid, Venous: 1.8 mmol/L (ref 0.5–2.2)

## 2013-09-08 MED ORDER — DEXAMETHASONE SODIUM PHOSPHATE 4 MG/ML IJ SOLN
4.0000 mg | Freq: Two times a day (BID) | INTRAMUSCULAR | Status: DC
  Administered 2013-09-08 – 2013-09-11 (×7): 4 mg via INTRAVENOUS
  Filled 2013-09-08 (×7): qty 1

## 2013-09-08 MED ORDER — FLEET ENEMA 7-19 GM/118ML RE ENEM: 1.0000 | ENEMA | Freq: Once | RECTAL | Status: AC | PRN

## 2013-09-08 MED ORDER — ONDANSETRON HCL 4 MG PO TABS: 4.0000 mg | ORAL_TABLET | Freq: Four times a day (QID) | ORAL | Status: DC | PRN

## 2013-09-08 MED ORDER — INFLUENZA VAC SPLIT QUAD 0.5 ML IM SUSP
0.5000 mL | INTRAMUSCULAR | Status: DC
  Filled 2013-09-08: qty 0.5

## 2013-09-08 MED ORDER — SODIUM CHLORIDE 0.9 % IV BOLUS (SEPSIS)
1000.0000 mL | Freq: Once | INTRAVENOUS | Status: AC
  Administered 2013-09-08: 1000 mL via INTRAVENOUS

## 2013-09-08 MED ORDER — PHENYTOIN SODIUM 50 MG/ML IJ SOLN
100.0000 mg | Freq: Three times a day (TID) | INTRAMUSCULAR | Status: DC
  Administered 2013-09-09 – 2013-09-11 (×7): 100 mg via INTRAVENOUS
  Filled 2013-09-08 (×8): qty 2

## 2013-09-08 MED ORDER — INSULIN ASPART 100 UNIT/ML ~~LOC~~ SOLN
0.0000 [IU] | Freq: Three times a day (TID) | SUBCUTANEOUS | Status: DC
  Administered 2013-09-08 – 2013-09-09 (×2): 8 [IU] via SUBCUTANEOUS

## 2013-09-08 MED ORDER — SODIUM CHLORIDE 0.9 % IV SOLN
INTRAVENOUS | Status: DC
  Administered 2013-09-08 – 2013-09-11 (×5): via INTRAVENOUS

## 2013-09-08 MED ORDER — ENOXAPARIN SODIUM 40 MG/0.4ML ~~LOC~~ SOLN
40.0000 mg | SUBCUTANEOUS | Status: DC
  Administered 2013-09-08 – 2013-09-10 (×3): 40 mg via SUBCUTANEOUS
  Filled 2013-09-08 (×3): qty 0.4

## 2013-09-08 MED ORDER — INSULIN ASPART 100 UNIT/ML ~~LOC~~ SOLN
0.0000 [IU] | Freq: Every day | SUBCUTANEOUS | Status: DC
  Administered 2013-09-08: 2 [IU] via SUBCUTANEOUS

## 2013-09-08 MED ORDER — ONDANSETRON HCL 4 MG/2ML IJ SOLN: 4.0000 mg | Freq: Four times a day (QID) | INTRAMUSCULAR | Status: DC | PRN

## 2013-09-08 MED ORDER — SODIUM CHLORIDE 0.9 % IJ SOLN
3.0000 mL | Freq: Two times a day (BID) | INTRAMUSCULAR | Status: DC
  Administered 2013-09-08 – 2013-09-11 (×3): 3 mL via INTRAVENOUS

## 2013-09-08 MED ORDER — SODIUM CHLORIDE 0.9 % IV BOLUS (SEPSIS): 500.0000 mL | Freq: Once | INTRAVENOUS | Status: DC

## 2013-09-08 MED ORDER — HYDROMORPHONE HCL PF 1 MG/ML IJ SOLN
0.5000 mg | INTRAMUSCULAR | Status: DC | PRN
  Administered 2013-09-08 – 2013-09-10 (×10): 0.5 mg via INTRAVENOUS
  Filled 2013-09-08 (×10): qty 1

## 2013-09-08 MED ORDER — LORAZEPAM 2 MG/ML IJ SOLN
1.0000 mg | INTRAMUSCULAR | Status: DC | PRN
  Administered 2013-09-08 – 2013-09-09 (×4): 1 mg via INTRAVENOUS
  Filled 2013-09-08 (×4): qty 1

## 2013-09-08 MED ORDER — SODIUM CHLORIDE 0.9 % IV SOLN
1000.0000 mg | Freq: Once | INTRAVENOUS | Status: AC
  Administered 2013-09-08: 1000 mg via INTRAVENOUS
  Filled 2013-09-08: qty 20

## 2013-09-08 MED ORDER — ACETAMINOPHEN 325 MG PO TABS: 650.0000 mg | ORAL_TABLET | Freq: Four times a day (QID) | ORAL | Status: DC | PRN

## 2013-09-08 MED ORDER — ACETAMINOPHEN 650 MG RE SUPP
650.0000 mg | Freq: Four times a day (QID) | RECTAL | Status: DC | PRN
  Administered 2013-09-09: 650 mg via RECTAL
  Filled 2013-09-08: qty 1

## 2013-09-08 NOTE — ED Notes (Addendum)
EMS reports they were called out for pt's high blood sugar (387), generalized weakness and confusion. Pt is unable to answer appropriate questions and family reports that pt has become incontinent in the last week. EMS reports GCS 13. Pt has history of breast cancer and history of brain tumor. Pt is currently receiving [place] nite treatments. Her last treatment was Friday.

## 2013-09-08 NOTE — Progress Notes (Signed)
MEDICATION RELATED CONSULT NOTE - INITIAL   Pharmacy Consult for Dilantin Indication: Pt with metastatic breast ca with brain mets, possible new onset seizures.  Allergies  Allergen Reactions  . Betadine [Povidone Iodine] Rash  . Erythromycin Other (See Comments)    Thrush   . Nsaids Other (See Comments)    Blisters  . Tape     Paper tape causes blisters.    Patient Measurements: Height: 5\' 5"  (165.1 cm) Weight: 207 lb (93.895 kg) IBW/kg (Calculated) : 57 Adjusted Body Weight: 71Kg  Vital Signs: Temp: 99.4 F (37.4 C) (10/08 1330) Temp src: Oral (10/08 1330) BP: 149/82 mmHg (10/08 1330) Pulse Rate: 113 (10/08 1330) Intake/Output from previous day:   Intake/Output from this shift: Total I/O In: -  Out: 545 [Urine:545]  Labs:  Recent Labs  09/29/2013 0656  WBC 26.5*  HGB 13.0  HCT 40.0  PLT 322  CREATININE 0.64  ALBUMIN 3.2*  PROT 6.8  AST 19  ALT 13  ALKPHOS 118*  BILITOT 0.4   Estimated Creatinine Clearance: 84.8 ml/min (by C-G formula based on Cr of 0.64).  Microbiology: Recent Results (from the past 720 hour(s))  URINE CULTURE     Status: None   Collection Time    08/31/13  3:51 PM      Result Value Range Status   Specimen Description URINE, CLEAN CATCH   Final   Special Requests NONE   Final   Culture  Setup Time     Final   Value: 08/31/2013 17:30     Two isolates with different morphologies were identified as the same organism.The most resistant organism was reported.     Performed at [facility] Count     Final   Value: >=100,000 COLONIES/ML     Performed at [facility]   Culture     Final   Value: KLEBSIELLA PNEUMONIAE     Performed at [facility]   Report Status 09/04/2013 FINAL   Final   Organism ID, Bacteria KLEBSIELLA PNEUMONIAE   Final   Medical History: Past Medical History  Diagnosis Date  . Diabetes mellitus   . Graves disease   . Cancer     breast  . Breast CA 05/22/2011  . Thyroid  disease     Graves disease/iodine ablation 16 yrs ago  . Hypertension   . Radiation 11/29/09-12/20/09    T-spine to T-6-upper T11  . Bone metastases     T-spine  . Bone metastases 06/20/2011  . 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 History of radiation therapy 10/06/08-11/28/08    R breast  . Metastasis to brain 08/25/2013   Medications:  Prescriptions prior to admission  Medication Sig Dispense Refill  . Calcium Carb-Cholecalciferol (CALCIUM 1000 + D PO) Take 1 tablet by mouth daily.      . Carboxymethylcellulose Sodium (REFRESH LIQUIGEL OP) Apply to eye at bedtime. Coats eyelids @@HS       . Cholecalciferol (VITAMIN D PO) Take 1,000 Units by mouth daily.        . ciprofloxacin (CIPRO) 500 MG tablet Take 1 tablet (500 mg total) by mouth every 12 (twelve) hours.  20 tablet  0  . clonazePAM (KLONOPIN) 0.5 MG tablet Take 0.5-1 mg by mouth at bedtime.      Marland Kitchen dexamethasone (DECADRON) 4 MG tablet Take 2 tablets (8 mg total) by mouth 2 (two) times daily with a meal.  90 tablet  1  . Docusate Calcium (STOOL  SOFTENER PO) Take 1 capsule by mouth daily as needed (taking 2 - 4 daily).       . Fesoterodine Fumarate (TOVIAZ) 8 MG TB24 Take 8 mg by mouth daily.      Marland Kitchen HYDROcodone-acetaminophen (NORCO/VICODIN) 5-325 MG per tablet Take 1 tablet by mouth every 4 (four) hours as needed for pain.  100 tablet  0  . levothyroxine (SYNTHROID, LEVOTHROID) 125 MCG tablet Take 125 mcg by mouth daily.        Marland Kitchen lisinopril (PRINIVIL,ZESTRIL) 10 MG tablet Take 10 mg by mouth daily.        Marland Kitchen LORazepam (ATIVAN) 1 MG tablet Take 1 tablet (1 mg total) by mouth 1 day or 1 dose. Take 30 minutes prior to radiation treatment for anxiety. May also take Q 6 hr as needed for nausea/vomiting.  30 tablet  0  . magnesium hydroxide (PHILLIPS CHEWS) 311 MG CHEW Chew 311 mg by mouth as needed.      . megestrol (MEGACE) 40 MG tablet Take 1 tablet (40 mg total) by mouth daily.  30 tablet  3  . metFORMIN  (GLUCOPHAGE) 500 MG tablet Take 500 mg by mouth daily.      . mirabegron ER (MYRBETRIQ) 25 MG TB24 Take 25 mg by mouth daily.      Marland Kitchen omeprazole (PRILOSEC) 20 MG capsule Take 1 capsule (20 mg total) by mouth daily.  30 capsule  5  . Polyethyl Glycol-Propyl Glycol (SYSTANE) 0.4-0.3 % SOLN Apply 2 drops to eye daily as needed (dry eyes).       . potassium chloride SA (K-DUR,KLOR-CON) 20 MEQ tablet Take 1 tablet (20 mEq total) by mouth 2 (two) times daily.  60 tablet  5  . SitaGLIPtin-MetFORMIN HCl (JANUMET XR) [phone] MG TB24 Take 1 tablet by mouth daily. Per Dr. Regino Schultze, pt states she is to take 1 -2 tablets daily depending on blood sugars, while on steroids      . venlafaxine (EFFEXOR) 75 MG tablet Take 1 tablet (75 mg total) by mouth at bedtime.  30 tablet  3  . Zoledronic Acid (ZOMETA IV) Inject into the vein every 30 (thirty) days.         Assessment: Patient is a 62 y/o female with history of breast cancer who has been admitted with progressive, worsening confusion and lethargy. Family reports that she had recently been diagnosed with brain mets and was undergoing radiation. Patient was reportedly doing quite well 2 weeks ago and has had a rapid decline since then. She started gamma knife radiation approximately 5 days ago, and since then her lethargy and generalized weakness has been getting worse.  Patient appears lethargic, is spontaneously moving her extremities, does not follow commands or answer questions. Per MD, with her metastatic brain disease, seizures will also need to be considered.  Start patient empirically on dilantin and request a neurology consultation.   Goal of Therapy:  Dilantin level (corrected) 10-20  Plan:  Dilantin 1000mg  IV now x 1 then Dilantin 100mg  slow IV push q8h Check level at steady state  [address], Kristen Parker A 09/25/2013,5:11 PM

## 2013-09-08 NOTE — H&P (Signed)
[facility] History and Physical  Kristen Parker ZOX:[id number] DOB: [birth year] DOA: 09/29/2013  Referring physician:  PCP: [name], MD  Specialists:   Chief Complaint: Weakness/altered female status/hyperglycemia HPI: Kristen Parker is a 62 y.o. female with a past medical history that includes diabetes, metastatic breast cancer recent diagnosis of right cerebellum brain metastases, recent UTI, Graves' disease presents to the emergency department today via EMS with a chief complaint of altered metal status. Information is obtained from the family who is at the bedside. Patient reports that approximately 7 days ago she developed progressively worsening weakness. She received radiation therapy 5 days ago and since then has deteriorated fairly quickly. Family reports that she has been confused uncooperative. Associated symptoms include anorexia, falling, incontinence of urine. There's been no report of vomiting. Family does indicate that the patient's nonverbal language seems to indicate a headache and generalized pain specifically patient will put her hands over her head and contract her legs and point her toes while in bed. Patient was scheduled for radiation yesterday and was unable to sit still for that treatment. She was given Ativan at that time and remained agitated. She was also treated 8 days ago for urinary tract infection with Cipro. Lab work in the emergency room yields a serum glucose of 307, sodium level of 129 alkaline phosphatase date 118 and a white count of 26.5 noting that patient has been on Decadron for several weeks. Urinalysis yields greater than 1000 glucose otherwise unremarkable. Chest x-ray yields no acute cardiopulmonary process CT of the head yields atrophy with small vessel chronic ischemic changes of deep cerebral white matter. Small falcine meningioma. Extensive osseous metastatic disease. No definite acute intracranial abnormalities. Vital signs are significant  for heart rate of 112. Symptoms came on gradually have persisted and worsened characterized as moderate to severe. Hospitalists are asked to    Review of Systems: Unable to get a review of systems with patient do to altered mental status. However family provided information and all systems negative except as indicated by history of present illness  Past Medical History  Diagnosis Date  . Diabetes mellitus   . Graves disease   . Cancer     breast  . Breast CA 05/22/2011  . Thyroid disease     Graves disease/iodine ablation 16 yrs ago  . Hypertension   . Radiation 11/29/09-12/20/09    T-spine to T-6-upper T11  . Bone metastases     T-spine  . Bone metastases 06/20/2011  . 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 History of radiation therapy 10/06/08-11/28/08    R breast  . Metastasis to brain 08/25/2013   Past Surgical History  Procedure Laterality Date  . Breast biopsy      left  . Axillary node dissection      biopsy  . Breast biopsy      right  . Eye surgery      laser retinal tears  . Acdf    . Cervical disc surgery      ant. fusion with plate and screws  . Cholecystectomy    . Cesarean section    . Tubal ligation    . Reproductive      Removal of rt reproductive system  . Dilatation and currettage    . Mastectomy      rt. radical  29 lymph nodes  . Portacath placement  2009  . Appendectomy    . Partial hysterectomy     Social History:  reports that  she quit smoking about 5 years ago. Her smoking use included Cigarettes. She has a 15 pack-year smoking history. She has never used smokeless tobacco. She reports that she does not drink alcohol or use illicit drugs. 2 weeks ago patient was ambulatory and independent with her ADLs. Recently got back from the beach. Allergies  Allergen Reactions  . Betadine [Povidone Iodine] Rash  . Erythromycin Other (See Comments)    Thrush   . Nsaids Other (See Comments)    Blisters  . Tape     Paper tape  causes blisters.     Family History  Problem Relation Age of Onset  . Cancer Father     Lung, Prostate  . COPD Father   . Heart disease Father     Congestive heart failure  . Cancer Mother     Cervical  . Cancer Paternal Aunt     Breast  . Cancer Paternal Aunt     Ovarian     Prior to Admission medications   Medication Sig Start Date End Date Taking? Authorizing Provider  Calcium Carb-Cholecalciferol (CALCIUM 1000 + D PO) Take 1 tablet by mouth daily.   Yes Historical Provider, MD  Carboxymethylcellulose Sodium (REFRESH LIQUIGEL OP) Apply to eye at bedtime. Coats eyelids @@HS    Yes Historical Provider, MD  Cholecalciferol (VITAMIN D PO) Take 1,000 Units by mouth daily.     Yes Historical Provider, MD  ciprofloxacin (CIPRO) 500 MG tablet Take 1 tablet (500 mg total) by mouth every 12 (twelve) hours. 08/31/13  Yes Roney Marion, MD  clonazePAM (KLONOPIN) 0.5 MG tablet Take 0.5-1 mg by mouth at bedtime.   Yes Historical Provider, MD  dexamethasone (DECADRON) 4 MG tablet Take 2 tablets (8 mg total) by mouth 2 (two) times daily with a meal. 08/25/13  Yes Ellouise Newer, PA-C  Docusate Calcium (STOOL SOFTENER PO) Take 1 capsule by mouth daily as needed (taking 2 - 4 daily).    Yes Historical Provider, MD  Fesoterodine Fumarate (TOVIAZ) 8 MG TB24 Take 8 mg by mouth daily.   Yes Historical Provider, MD  HYDROcodone-acetaminophen (NORCO/VICODIN) 5-325 MG per tablet Take 1 tablet by mouth every 4 (four) hours as needed for pain. 08/25/13  Yes Maurine Minister Kefalas, PA-C  levothyroxine (SYNTHROID, LEVOTHROID) 125 MCG tablet Take 125 mcg by mouth daily.     Yes Historical Provider, MD  lisinopril (PRINIVIL,ZESTRIL) 10 MG tablet Take 10 mg by mouth daily.     Yes Historical Provider, MD  LORazepam (ATIVAN) 1 MG tablet Take 1 tablet (1 mg total) by mouth 1 day or 1 dose. Take 30 minutes prior to radiation treatment for anxiety. May also take Q 6 hr as needed for nausea/vomiting. 09/03/13  Yes Jonna Coup, MD  magnesium hydroxide (PHILLIPS CHEWS) 311 MG CHEW Chew 311 mg by mouth as needed.   Yes Historical Provider, MD  megestrol (MEGACE) 40 MG tablet Take 1 tablet (40 mg total) by mouth daily. 08/20/13  Yes Maurine Minister Kefalas, PA-C  metFORMIN (GLUCOPHAGE) 500 MG tablet Take 500 mg by mouth daily.   Yes Historical Provider, MD  mirabegron ER (MYRBETRIQ) 25 MG TB24 Take 25 mg by mouth daily.   Yes Historical Provider, MD  omeprazole (PRILOSEC) 20 MG capsule Take 1 capsule (20 mg total) by mouth daily. 03/25/13  Yes Ellouise Newer, PA-C  Polyethyl Glycol-Propyl Glycol (SYSTANE) 0.4-0.3 % SOLN Apply 2 drops to eye daily as needed (dry eyes).    Yes Historical Provider,  MD  potassium chloride SA (K-DUR,KLOR-CON) 20 MEQ tablet Take 1 tablet (20 mEq total) by mouth 2 (two) times daily. 03/16/13  Yes Randall An, MD  SitaGLIPtin-MetFORMIN HCl (JANUMET XR) [phone] MG TB24 Take 1 tablet by mouth daily. Per Dr. Regino Schultze, pt states she is to take 1 -2 tablets daily depending on blood sugars, while on steroids   Yes Historical Provider, MD  venlafaxine (EFFEXOR) 75 MG tablet Take 1 tablet (75 mg total) by mouth at bedtime. 07/01/13  Yes Ellouise Newer, PA-C  Zoledronic Acid (ZOMETA IV) Inject into the vein every 30 (thirty) days.     Yes Historical Provider, MD   Physical Exam: Filed Vitals:   10/08/2013 1213  BP:   Pulse:   Temp: 99.6 F (37.6 C)  Resp:      General:  Well-nourished well-developed appears somewhat ill  Eyes: PE RRL, EOMI, no scleral icterus  ENT: Ears clear nose without drainage oropharynx without erythema or exudate. Mucous membranes of her mouth are pink slightly dry  Neck: Supple no JVD full range of motion no lymphadenopathy  Cardiovascular: Tachycardic but regular no murmur no gallop no rubs no lower extremity edema pedal pulses present and palpable  Respiratory: Normal effort slightly shallow breath sounds clear bilaterally to auscultation no wheeze no rhonchi  noted  Abdomen: Obese soft positive bowel sounds nontender to palpation no mass organomegaly noted no guarding or  Skin: Warm and dry no rashes or lesions noted  Musculoskeletal: Patient restless in bed moves all extremities spontaneously joints without swelling or erythema  Psychiatric: Lethargic does not respond verbally  Neurologic: Lethargic does not respond verbally attempts to follow commands moves all extremities spontaneously bilateral grip 5 out of 5. Frequent contracture of lower extremities and pointing of toes until soles of feet stimulated then all toes/foot point up.   Labs on Admission:  Basic Metabolic Panel:  Recent Labs Lab 09/03/13 1651 10-08-2013 0656  NA  --  129*  K  --  4.3  CL  --  93*  CO2  --  23  GLUCOSE  --  332*  BUN 29* 29*  CREATININE 0.83 0.64  CALCIUM  --  9.6   Liver Function Tests:  Recent Labs Lab 08-Oct-2013 0656  AST 19  ALT 13  ALKPHOS 118*  BILITOT 0.4  PROT 6.8  ALBUMIN 3.2*   No results found for this basename: LIPASE, AMYLASE,  in the last 168 hours No results found for this basename: AMMONIA,  in the last 168 hours CBC:  Recent Labs Lab 10/08/2013 0656  WBC 26.5*  NEUTROABS 24.8*  HGB 13.0  HCT 40.0  MCV 96.6  PLT 322   Cardiac Enzymes: No results found for this basename: CKTOTAL, CKMB, CKMBINDEX, TROPONINI,  in the last 168 hours  BNP (last 3 results)  Recent Labs  06/15/13 0940  PROBNP 63.5   CBG:  Recent Labs Lab Oct 08, 2013 0609  GLUCAP 307*    Radiological Exams on Admission: Dg Chest 1 View  10/08/2013   CLINICAL DATA:  Lethargic, recent brain tumor, history of breast cancer  EXAM: PORTABLE CHEST - 1 VIEW  COMPARISON:  08/31/2013  FINDINGS: Low lung volumes. No focal consolidation. No pleural effusion or pneumothorax.  Heart is top-normal in size.  Stable left chest power port.  Status post right mastectomy with surgical clips in the right chest wall/axilla.  Cervical spine fixation hardware.   IMPRESSION: No evidence of acute cardiopulmonary disease.   Electronically Signed  By: Charline Bills M.D.   On: 09/18/2013 07:55   Ct Head Wo Contrast  09/17/2013   CLINICAL DATA:  Agitation, altered mental status, history breast cancer with CNS metastasis, hyperglycemia  EXAM: CT HEAD WITHOUT CONTRAST  TECHNIQUE: Contiguous axial images were obtained from the base of the skull through the vertex without intravenous contrast.  COMPARISON:  08/31/2013, MRI brain 09/01/2013  FINDINGS: Motion artifacts, for which repeat imaging was performed.  Generalized atrophy.  Mild prominence of ventricular system, stable since previous exam.  No midline shift or mass effect.  Small vessel chronic ischemic changes in deep cerebral white matter.  Known right cerebellar metastasis is not well visualized.  Small lenticular high attenuation focus along the anterior falx 9 x 3 mm, by prior MR a small small meningioma.  Compared to previous exam, slight effacement of sulci is identified at the vertex particularly at left vertex is seen, suspect differences in slice averaging versus previous exam  No definite CNS metastasis was seen at this site on recent MR.  No definite intracranial hemorrhage, additional mass lesion, or evidence of acute infarction.  No extra-axial fluid collections.  Visualized sinuses and mastoid air cells clear.  Numerous sclerotic foci throughout calvaria and skull base consistent with widespread osseous metastatic disease.  CT imaging of the cervical spine was inadvertently performed. Prior anterior fusion of C5-C7.  Scattered osseous metastases.  Prevertebral soft tissues normal thickness.  Motion artifacts at C2.  No definite acute fracture, subluxation or bone destruction.  IMPRESSION: Atrophy with small vessel chronic ischemic changes of deep cerebral white matter.  Small falcine meningioma.  Extensive osseous metastatic disease.  No definite acute intracranial abnormalities.   Electronically Signed    By: Ulyses Southward M.D.   On: 09/07/2013 09:23    EKG:   Assessment/Plan Principal Problem:   Acute encephalopathy: Etiology unclear although concern for worsening brain metastases. Not likely infectious process as urinalysis is unremarkable and chest x-ray without acute process and lactic acid level within the limits of normal. Seizures also possibility.  Patient does have a leukocytosis but has been on Decadron for several weeks. Her rectal temp was 99.6. Will admit to telemetry. Will provide supportive care in the form of IV fluids pain medicine. I will make her n.p.o. right now as patient is not alert enough to take by mouth meds or food. Will obtain EEG. Will request an oncology consult as well. Patient may need further radiation and/or transfer to [facility] long for therapies.   Active Problems:  Leukocytosis: Likely related to Decadron patient has been on for several weeks. Chart review indicates her current level is at baseline. Her rectal temperature was 99.6. Analysis was unremarkable 1 chest x-ray without acute process.    Tachycardia: Likely related to dehydration. Family indicates patient has taken very little by mouth over the last 3 days. Will provide vigorous IV fluids. Will monitor on telemetry. Check an EKG.    Hyponatremia: Again likely related to dehydration. Will give IV fluids and check in the a.m.   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 We'll request oncology consult. Patient had started radiation 5 days ago. Will continue Decadron. Will provide pain medicine as needed    Diabetes: Patient was hyperglycemic on admission. Of note she has been on Decadron for several weeks. Patient is n.p.o. on admission do to acute encephalopathy. Will check hemoglobin A1c. Will monitor CBGs and provide sliding scale insulin.    Antineoplastic chemotherapy induced anemia(285.3) chart review indicates  currently stable. Will monitor.  Hypertension. Fair control. Patient on  lisinopril at home. Will hold this now do to encephalopathy. Will monitor blood pressure. Will provide hydralazine when necessary.  Graves' disease. Will check TSH.   Oncology  Code Status: full Family Communication: husband/son/daughter-in-law at bedside Disposition Plan: home when ready   Time spent: 65 minutes  Gwenyth Bender [facility] Pager [phone]  If 7PM-7AM, please contact night-coverage [email or web address] Password [email or web address] 09/06/2013, 12:22 PM

## 2013-09-08 NOTE — ED Provider Notes (Signed)
CSN: [id number]     Arrival date & time 10/07/13  0603 History  This chart was scribed for Kristen Lennert, MD by Quintella Reichert, ED scribe.  This patient was seen in room APA06/APA06 and the patient's care was started at 7:08 AM.  Chief Complaint  Patient presents with  . Hyperglycemia  . Weakness  . Altered Mental Status    Patient is a 62 y.o. female presenting with altered mental status. The history is provided by a relative. No language interpreter was used.  Altered Mental Status Presenting symptoms: confusion, disorientation and lethargy   Presenting symptoms comment:  Uncooperative, responding inappropriately Severity:  Severe Most recent episode: Began slightly over a week ago. Episode number: Ongoing. Timing:  Constant Progression:  Worsening Chronicity:  New Context comment:  Brain tumor, hyperglycemia Associated symptoms: bladder incontinence, decreased appetite and headaches     Level 5 Caveat: AMS  HPI Comments: Kristen Parker is a 62 y.o. 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  Pt's family reports that slightly over a week ago pt developed mild, progressively-worsening weakness.  She received radiation therapy 5 days ago and since then she has had progressively-worsening deterioration in her mental condition and has been uncooperative, confused, lethargic, and responding to questions inappropriately.  Family states she is very weak and is not able to ambulate with assistance  She has also been complaining of headaches and has been incontinent.  Family also notes she is eating and drinking very little.  Family called EMS today when pt's blood sugar was 387.Kristen Parker  Pt was diagnosed with a brain tumor 3 weeks ago during her last chemotherapy treatment.  Her last radiation treatment was 5 days ago at [facility].  She would not  cooperate for her radiation therapy yesterday and "wouldn't be still."  She was given Ativan but was still agitated.   She was also seen in the ED 8 days ago for a UTI and was able to ambulate at that time.  Pt has been on steroids recently.    PCP is Dr. Regino Schultze   Past Medical History  Diagnosis Date  . Diabetes mellitus   . Graves disease   . Cancer     breast  . Breast CA 05/22/2011  . Thyroid disease     Graves disease/iodine ablation 16 yrs ago  . Hypertension   . Radiation 11/29/09-12/20/09    T-spine to T-6-upper T11  . Bone metastases     T-spine  . Bone metastases 06/20/2011  . 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 History of radiation therapy 10/06/08-11/28/08    R breast  . Metastasis to brain 08/25/2013    Past Surgical History  Procedure Laterality Date  . Breast biopsy      left  . Axillary node dissection      biopsy  . Breast biopsy      right  . Eye surgery      laser retinal tears  . Acdf    . Cervical disc surgery      ant. fusion with plate and screws  . Cholecystectomy    . Cesarean section    . Tubal ligation    . Reproductive      Removal of rt reproductive system  . Dilatation and currettage    . Mastectomy      rt. radical  29  lymph nodes  . Portacath placement  2009  . Appendectomy    . Partial hysterectomy      Family History  Problem Relation Age of Onset  . Cancer Father     Lung, Prostate  . COPD Father   . Heart disease Father     Congestive heart failure  . Cancer Mother     Cervical  . Cancer Paternal Aunt     Breast  . Cancer Paternal Aunt     Ovarian    History  Substance Use Topics  . Smoking status: Former Smoker -- 0.50 packs/day for 30 years    Types: Cigarettes    Quit date: 08/10/2008  . Smokeless tobacco: Never Used  . Alcohol Use: No    OB History   Grav Para Term Preterm Abortions TAB SAB Ect Mult Living   2 2 2       2       Review of Systems  Unable to perform ROS:  Mental status change  Constitutional: Positive for decreased appetite.  Genitourinary: Positive for bladder incontinence.  Neurological: Positive for headaches.  Psychiatric/Behavioral: Positive for confusion.     Allergies  Betadine; Erythromycin; Nsaids; and Tape  Home Medications   Current Outpatient Rx  Name  Route  Sig  Dispense  Refill  . Calcium Carb-Cholecalciferol (CALCIUM 1000 + D PO)   Oral   Take 1 tablet by mouth daily.         . Carboxymethylcellulose Sodium (REFRESH LIQUIGEL OP)   Ophthalmic   Apply to eye at bedtime. Coats eyelids @@HS          . Cholecalciferol (VITAMIN D PO)   Oral   Take 1,000 Units by mouth daily.           . ciprofloxacin (CIPRO) 500 MG tablet   Oral   Take 1 tablet (500 mg total) by mouth every 12 (twelve) hours.   20 tablet   0   . clonazePAM (KLONOPIN) 0.5 MG tablet   Oral   Take 0.5-1 mg by mouth at bedtime.         Kristen Parker dexamethasone (DECADRON) 4 MG tablet   Oral   Take 2 tablets (8 mg total) by mouth 2 (two) times daily with a meal.   90 tablet   1   . Docusate Calcium (STOOL SOFTENER PO)   Oral   Take 1 capsule by mouth daily as needed (taking 2 - 4 daily).          . Fesoterodine Fumarate (TOVIAZ) 8 MG TB24   Oral   Take 8 mg by mouth daily.         Kristen Parker HYDROcodone-acetaminophen (NORCO/VICODIN) 5-325 MG per tablet   Oral   Take 1 tablet by mouth every 4 (four) hours as needed for pain.   100 tablet   0   . levothyroxine (SYNTHROID, LEVOTHROID) 125 MCG tablet   Oral   Take 125 mcg by mouth daily.           Kristen Parker lisinopril (PRINIVIL,ZESTRIL) 10 MG tablet   Oral   Take 10 mg by mouth daily.           Kristen Parker LORazepam (ATIVAN) 1 MG tablet   Oral   Take 1 tablet (1 mg total) by mouth 1 day or 1 dose. Take 30 minutes prior to radiation treatment for anxiety. May also take Q 6 hr as needed for nausea/vomiting.   30 tablet   0   .  magnesium hydroxide (PHILLIPS CHEWS) 311 MG CHEW   Oral   Chew 311 mg by  mouth as needed.         . megestrol (MEGACE) 40 MG tablet   Oral   Take 1 tablet (40 mg total) by mouth daily.   30 tablet   3   . metFORMIN (GLUCOPHAGE) 500 MG tablet   Oral   Take 500 mg by mouth daily.         . mirabegron ER (MYRBETRIQ) 25 MG TB24   Oral   Take 25 mg by mouth daily.         Kristen Parker omeprazole (PRILOSEC) 20 MG capsule   Oral   Take 1 capsule (20 mg total) by mouth daily.   30 capsule   5   . Polyethyl Glycol-Propyl Glycol (SYSTANE) 0.4-0.3 % SOLN   Ophthalmic   Apply 2 drops to eye.         . potassium chloride SA (K-DUR,KLOR-CON) 20 MEQ tablet   Oral   Take 1 tablet (20 mEq total) by mouth 2 (two) times daily.   60 tablet   5   . SitaGLIPtin-MetFORMIN HCl (JANUMET XR) [phone] MG TB24   Oral   Take 1 tablet by mouth daily. Per Dr. Regino Schultze, pt states she is to take 1 -2 tablets daily depending on blood sugars, while on steroids         . Zoledronic Acid (ZOMETA IV)   Intravenous   Inject into the vein every 30 (thirty) days.           Kristen Parker CLOBETASOL PROP EMOLLIENT BASE EX   Apply externally   Apply 1 application topically as needed (for irritation).          . Diphenhyd-HC-Nystatin-Tetracyc (FIRST-MARYS MOUTHWASH) SUSP   Oral   Take 5 mLs by mouth 4 (four) times daily as needed.   300 mL   3   . triamterene-hydrochlorothiazide (DYAZIDE) 37.5-25 MG per capsule   Oral   Take 1 each (1 capsule total) by mouth 3 (three) times a week. Taking Mondays, Wednesdays and Fridays   30 capsule   3   . venlafaxine (EFFEXOR) 75 MG tablet   Oral   Take 1 tablet (75 mg total) by mouth at bedtime.   30 tablet   3    BP 128/36  Pulse 109  Temp(Src) 97.7 F (36.5 C) (Oral)  Resp 22  SpO2 99%  Physical Exam  Nursing note and vitals reviewed. Constitutional: She appears well-developed.  Lethargic  HENT:  Head: Normocephalic.  Eyes: Conjunctivae and EOM are normal. No scleral icterus.  Neck: Neck supple. No thyromegaly present.   Cardiovascular: Normal rate, regular rhythm and normal heart sounds.  Exam reveals no gallop and no friction rub.   No murmur heard. Pulmonary/Chest: Effort normal and breath sounds normal. No stridor. She has no wheezes. She has no rales. She exhibits no tenderness.  Abdominal: Soft. She exhibits no distension. There is no tenderness. There is no rebound.  Musculoskeletal: Normal range of motion. She exhibits no edema.  Lymphadenopathy:    She has no cervical adenopathy.  Neurological: She exhibits normal muscle tone. Coordination normal.  Lethargic Oriented to person, but not place or time Able to move all extremities on command  Skin: No rash noted. No erythema.    ED Course  Procedures (including critical care time)  DIAGNOSTIC STUDIES: Oxygen Saturation is 99% on room air, normal by my interpretation.    COORDINATION OF  CARE: 7:13 AM-Discussed treatment plan which includes labs and imaging with pt's family at bedside and they agreed to plan.   10:41 AM: Pt's family states she is "restless."  Pt is still lethargic but states she feels "pretty good."  Family notes she is scheduled to receive her next treatment in the next several days.   Labs Review Labs Reviewed  GLUCOSE, CAPILLARY - Abnormal; Notable for the following:    Glucose-Capillary 307 (*)    All other components within normal limits  CBC WITH DIFFERENTIAL - Abnormal; Notable for the following:    WBC 26.5 (*)    RDW 17.0 (*)    Neutrophils Relative % 93 (*)    Neutro Abs 24.8 (*)    Lymphocytes Relative 4 (*)    Monocytes Relative 2 (*)    All other components within normal limits  COMPREHENSIVE METABOLIC PANEL - Abnormal; Notable for the following:    Sodium 129 (*)    Chloride 93 (*)    Glucose, Bld 332 (*)    BUN 29 (*)    Albumin 3.2 (*)    Alkaline Phosphatase 118 (*)    All other components within normal limits  URINALYSIS, ROUTINE W REFLEX MICROSCOPIC - Abnormal; Notable for the following:     Specific Gravity, Urine >1.030 (*)    Glucose, UA >1000 (*)    Ketones, ur 15 (*)    All other components within normal limits  LACTIC ACID, PLASMA  URINE MICROSCOPIC-ADD ON    Imaging Review Dg Chest 1 View  10-03-2013   CLINICAL DATA:  Lethargic, recent brain tumor, history of breast cancer  EXAM: PORTABLE CHEST - 1 VIEW  COMPARISON:  08/31/2013  FINDINGS: Low lung volumes. No focal consolidation. No pleural effusion or pneumothorax.  Heart is top-normal in size.  Stable left chest power port.  Status post right mastectomy with surgical clips in the right chest wall/axilla.  Cervical spine fixation hardware.  IMPRESSION: No evidence of acute cardiopulmonary disease.   Electronically Signed   By: Charline Bills M.D.   On: Oct 03, 2013 07:55   Ct Head Wo Contrast  10-03-13   CLINICAL DATA:  Agitation, altered mental status, history breast cancer with CNS metastasis, hyperglycemia  EXAM: CT HEAD WITHOUT CONTRAST  TECHNIQUE: Contiguous axial images were obtained from the base of the skull through the vertex without intravenous contrast.  COMPARISON:  08/31/2013, MRI brain 09/01/2013  FINDINGS: Motion artifacts, for which repeat imaging was performed.  Generalized atrophy.  Mild prominence of ventricular system, stable since previous exam.  No midline shift or mass effect.  Small vessel chronic ischemic changes in deep cerebral white matter.  Known right cerebellar metastasis is not well visualized.  Small lenticular high attenuation focus along the anterior falx 9 x 3 mm, by prior MR a small small meningioma.  Compared to previous exam, slight effacement of sulci is identified at the vertex particularly at left vertex is seen, suspect differences in slice averaging versus previous exam  No definite CNS metastasis was seen at this site on recent MR.  No definite intracranial hemorrhage, additional mass lesion, or evidence of acute infarction.  No extra-axial fluid collections.  Visualized sinuses and  mastoid air cells clear.  Numerous sclerotic foci throughout calvaria and skull base consistent with widespread osseous metastatic disease.  CT imaging of the cervical spine was inadvertently performed. Prior anterior fusion of C5-C7.  Scattered osseous metastases.  Prevertebral soft tissues normal thickness.  Motion artifacts at C2.  No  definite acute fracture, subluxation or bone destruction.  IMPRESSION: Atrophy with small vessel chronic ischemic changes of deep cerebral white matter.  Small falcine meningioma.  Extensive osseous metastatic disease.  No definite acute intracranial abnormalities.   Electronically Signed   By: Ulyses Southward M.D.   On: 09-17-2013 09:23     MDM  No diagnosis found.  Spoke with tom kefalas and it was felt the pt could be care at [facility] for now.  She may need radiation at [facility]   The chart was scribed for me under my direct supervision.  I personally performed the history, physical, and medical decision making and all procedures in the evaluation of this patient.Kristen Lennert, MD September 17, 2013 [phone]

## 2013-09-08 NOTE — H&P (Signed)
Patient seen and examined, agree with note as above per Toya Smothers, NP.  Patient is a 62 y/o female with history of breast cancer who has been admitted with progressive, worsening confusion and lethargy.  Family reports that she had recently been diagnosed with brain mets and was undergoing gamma knife radiation.  Patient was reportedly doing quite well 2 weeks ago and has had a rapid decline since then.  Approximately one week ago, she was seen in the ED and urinalysis indicated a klebsiella urinary tract infection. Family reports that at that time she was noted to be falling frequently.  She had started steroids for her brain mets and family also reported that her blood sugars had been running very high (300-400). She started gamma knife radiation approximately 5 days ago, and since then her lethargy and generalized weakness has been getting worse.  Po intake has been poor.  No fever, cough, dysuria, diarrhea or vomiting reported.  She was evaluated in the ED and noted to be clinically dehydrated.  Infectious work up was otherwise negative.  On evaluation, patient appears lethargic, is spontaneously moving her extremities, does not follow commands or answer questions.  This does appear to be waxing and waning. She does have increased tone in her extremities, no ankle clonus or babinski appreciated.  Pupils are round, equal and appear to be dilated.    Her progressively lethargy and weakness may simply be due to a metabolic encephalopathy due to dehydration/hyponatremia.  With her metastatic brain disease, I think seizures will also need to be considered.  Will order EEG, start patient empirically on dilantin and request a neurology consultation. Have also requested oncology consultation to determine if this patient needs inpatient radiation therapy and therefore transfer to [facility].  Her leukocytosis is likely related to stress demargination from steroids.  Lactic acid normal.  Will check  blood cultures to rule out underlying infection.  Lennox Dolberry

## 2013-09-08 NOTE — Progress Notes (Signed)
UR Chart Review Completed  

## 2013-09-09 ENCOUNTER — Encounter (HOSPITAL_COMMUNITY): Payer: Self-pay | Admitting: *Deleted

## 2013-09-09 ENCOUNTER — Inpatient Hospital Stay (HOSPITAL_COMMUNITY)
Admit: 2013-09-09 | Discharge: 2013-09-09 | Disposition: A | Discharge status: Home / Self Care | Payer: Medicare Other | Attending: Internal Medicine | Admitting: Internal Medicine

## 2013-09-09 ENCOUNTER — Other Ambulatory Visit (HOSPITAL_COMMUNITY): Payer: Self-pay | Admitting: Oncology

## 2013-09-09 ENCOUNTER — Ambulatory Visit: Payer: Medicare Other | Admitting: Radiation Oncology

## 2013-09-09 ENCOUNTER — Inpatient Hospital Stay (HOSPITAL_COMMUNITY): Payer: Medicare Other

## 2013-09-09 DIAGNOSIS — C7931 Secondary malignant neoplasm of brain: Principal | ICD-10-CM

## 2013-09-09 DIAGNOSIS — R7309 Other abnormal glucose: Secondary | ICD-10-CM

## 2013-09-09 DIAGNOSIS — C50919 Malignant neoplasm of unspecified site of unspecified female breast: Secondary | ICD-10-CM

## 2013-09-09 DIAGNOSIS — R404 Transient alteration of awareness: Secondary | ICD-10-CM

## 2013-09-09 DIAGNOSIS — E860 Dehydration: Secondary | ICD-10-CM

## 2013-09-09 DIAGNOSIS — C801 Malignant (primary) neoplasm, unspecified: Secondary | ICD-10-CM

## 2013-09-09 LAB — CBC
MCH: 31.3 pg (ref 26.0–34.0)
Platelets: 351 10*3/uL (ref 150–400)
RDW: 16.7 % — ABNORMAL HIGH (ref 11.5–15.5)
WBC: 33.2 10*3/uL — ABNORMAL HIGH (ref 4.0–10.5)

## 2013-09-09 LAB — COMPREHENSIVE METABOLIC PANEL
ALT: 12 U/L (ref 0–35)
AST: 18 U/L (ref 0–37)
Alkaline Phosphatase: 116 U/L (ref 39–117)
BUN: 19 mg/dL (ref 6–23)
CO2: 20 mEq/L (ref 19–32)
Chloride: 92 mEq/L — ABNORMAL LOW (ref 96–112)
GFR calc Af Amer: >90 mL/min (ref >90)
GFR calc non Af Amer: >90 mL/min (ref >90)
Glucose, Bld: 283 mg/dL — ABNORMAL HIGH (ref 70–99)
Potassium: 4 mEq/L (ref 3.5–5.1)
Sodium: 128 mEq/L — ABNORMAL LOW (ref 135–145)
Total Bilirubin: 0.4 mg/dL (ref 0.3–1.2)
Total Protein: 6.8 g/dL (ref 6.0–8.3)

## 2013-09-09 LAB — BLOOD GAS, ARTERIAL
Acid-base deficit: 2.6 mmol/L — ABNORMAL HIGH (ref 0.0–2.0)
Drawn by: 23534
O2 Content: 21 L/min
O2 Saturation: 94.2 %
Patient temperature: 37
pH, Arterial: 7.419 (ref 7.350–7.450)
pO2, Arterial: 72.3 mmHg — ABNORMAL LOW (ref 80.0–100.0)

## 2013-09-09 LAB — GLUCOSE, CAPILLARY
Glucose-Capillary: 277 mg/dL — ABNORMAL HIGH (ref 70–99)
Glucose-Capillary: 223 mg/dL — ABNORMAL HIGH (ref 70–99)

## 2013-09-09 LAB — CSF CELL COUNT WITH DIFFERENTIAL
RBC Count, CSF: 25 /mm3 — ABNORMAL HIGH
Tube #: 3

## 2013-09-09 LAB — HEMOGLOBIN A1C: Hgb A1c MFr Bld: 8 % — ABNORMAL HIGH (ref <5.7)

## 2013-09-09 LAB — LACTATE DEHYDROGENASE, PLEURAL OR PERITONEAL FLUID: LD, Fluid: 41 U/L — ABNORMAL HIGH (ref 3–23)

## 2013-09-09 LAB — OSMOLALITY: Osmolality: 284 mOsm/kg (ref 275–300)

## 2013-09-09 LAB — PROTEIN, CSF: Total  Protein, CSF: 197 mg/dL — ABNORMAL HIGH (ref 15–45)

## 2013-09-09 LAB — AMMONIA: Ammonia: 22 umol/L (ref 11–60)

## 2013-09-09 LAB — GLUCOSE, CSF: Glucose, CSF: 107 mg/dL — ABNORMAL HIGH (ref 43–76)

## 2013-09-09 MED ORDER — INSULIN DETEMIR 100 UNIT/ML ~~LOC~~ SOLN
5.0000 [IU] | Freq: Every day | SUBCUTANEOUS | Status: DC
  Administered 2013-09-09: 5 [IU] via SUBCUTANEOUS
  Filled 2013-09-09 (×2): qty 0.05

## 2013-09-09 MED ORDER — INSULIN ASPART 100 UNIT/ML ~~LOC~~ SOLN
0.0000 [IU] | Freq: Every day | SUBCUTANEOUS | Status: DC
  Administered 2013-09-09: 2 [IU] via SUBCUTANEOUS

## 2013-09-09 MED ORDER — GADOBENATE DIMEGLUMINE 529 MG/ML IV SOLN
20.0000 mL | Freq: Once | INTRAVENOUS | Status: AC | PRN
  Administered 2013-09-09: 20 mL via INTRAVENOUS

## 2013-09-09 MED ORDER — INSULIN ASPART 100 UNIT/ML ~~LOC~~ SOLN
0.0000 [IU] | Freq: Three times a day (TID) | SUBCUTANEOUS | Status: DC
  Administered 2013-09-09 – 2013-09-10 (×3): 7 [IU] via SUBCUTANEOUS
  Administered 2013-09-10: 11 [IU] via SUBCUTANEOUS
  Administered 2013-09-10: 7 [IU] via SUBCUTANEOUS

## 2013-09-09 MED ORDER — CLOTRIMAZOLE 1 % VA CREA
1.0000 | TOPICAL_CREAM | Freq: Every day | VAGINAL | Status: DC
  Administered 2013-09-09 – 2013-09-10 (×2): 1 via VAGINAL
  Filled 2013-09-09: qty 45

## 2013-09-09 MED ORDER — ACYCLOVIR SODIUM 50 MG/ML IV SOLN
INTRAVENOUS | Status: AC
  Filled 2013-09-09: qty 20

## 2013-09-09 MED ORDER — DEXTROSE 5 % IV SOLN
10.0000 mg/kg | Freq: Three times a day (TID) | INTRAVENOUS | Status: DC
  Administered 2013-09-09 – 2013-09-10 (×2): 940 mg via INTRAVENOUS
  Filled 2013-09-09 (×3): qty 18.8

## 2013-09-09 NOTE — Progress Notes (Signed)
INITIAL NUTRITION ASSESSMENT  DOCUMENTATION CODES Per approved criteria  -Not Applicable   INTERVENTION:  Follow diet advancement and food preferences   NUTRITION DIAGNOSIS: Inadequate oral intake related to encephalopathy and metastatic cancer illness as evidenced by           .   Goal: Pt to meet >/= 90% of their estimated nutrition needs   Monitor:  Diet advancement, po intake, labs and wt trends  Reason for Assessment: Malnutrition Screen Score = 2  62 y.o. female  Admitting Dx: Acute encephalopathy  ASSESSMENT: Pt weight currently within usual range. Unable to complete full nutrition assessment at this time. Patient Active Problem List   Diagnosis Date Noted  . Diabetes 09/18/2013  . Acute encephalopathy 09/27/2013  . Leukocytosis 09/01/2013  . Tachycardia 09/03/2013  . Hyponatremia 09/27/2013  . Hypertension   . Metastasis to brain 08/25/2013  . 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 UTI (lower urinary tract infection) 06/25/2012  . Bone metastases 06/20/2011  . Breast CA 05/22/2011    Class: Diagnosis of  . ACHILLES TENDON TEAR 05/15/2007    Height: Ht Readings from Last 1 Encounters:  09/01/2013 5\' 5"  (1.651 m)    Weight: Wt Readings from Last 1 Encounters:  09/06/2013 207 lb (93.895 kg)    Ideal Body Weight: 125# (56.8 kg)  % Ideal Body Weight: 166%  Wt Readings from Last 10 Encounters:  09/21/2013 207 lb (93.895 kg)  09/01/13 207 lb 14.4 oz (94.303 kg)  08/31/13 214 lb (97.07 kg)  08/03/13 214 lb (97.07 kg)  07/20/13 213 lb (96.616 kg)  07/06/13 211 lb (95.709 kg)  06/22/13 207 lb 12.8 oz (94.257 kg)  05/28/13 207 lb 12.8 oz (94.257 kg)  05/25/13 212 lb 12.8 oz (96.525 kg)  05/11/13 213 lb 9.6 oz (96.888 kg)    Usual Body Weight: 207-214#  % Usual Body Weight: 100%  BMI:  Body mass index is 34.45 kg/(m^2).obesity class I  Estimated Nutritional Needs: Kcal: 1610-9604 Protein: 85-95 gr Fluid: >2000 ml  /day  Skin: No issues noted  Diet Order: NPO  EDUCATION NEEDS: -Education not appropriate at this time   Intake/Output Summary (Last 24 hours) at 09/09/13 0851 Last data filed at 09/09/13 0600  Gross per 24 hour  Intake 1427.08 ml  Output   2625 ml  Net -1197.92 ml    Last BM: 09/04/13  Labs:   Recent Labs Lab 09/03/13 1651 09/04/2013 0656 09/09/13 0507  NA  --  129* 128*  K  --  4.3 4.0  CL  --  93* 92*  CO2  --  23 20  BUN 29* 29* 19  CREATININE 0.83 0.64 0.49*  CALCIUM  --  9.6 9.3  GLUCOSE  --  332* 283*    CBG (last 3)   Recent Labs  09/20/2013 1620 09/18/2013 2059 09/09/13 0726  GLUCAP 271* 233* 277*    Scheduled Meds: . dexamethasone  4 mg Intravenous Q12H  . enoxaparin (LOVENOX) injection  40 mg Subcutaneous Q24H  . insulin aspart  0-20 Units Subcutaneous TID WC  . insulin aspart  0-5 Units Subcutaneous QHS  . insulin detemir  5 Units Subcutaneous Daily  . phenytoin (DILANTIN) IV  100 mg Intravenous Q8H  . sodium chloride  3 mL Intravenous Q12H    Continuous Infusions: . sodium chloride 125 mL/hr at 09/04/2013 1835    Past Medical History  Diagnosis Date  . Diabetes mellitus   . Graves disease   . Cancer  breast  . Breast CA 05/22/2011  . Thyroid disease     Graves disease/iodine ablation 16 yrs ago  . Hypertension   . Radiation 11/29/09-12/20/09    T-spine to T-6-upper T11  . Bone metastases     T-spine  . Bone metastases 06/20/2011  . 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 History of radiation therapy 10/06/08-11/28/08    R breast  . Metastasis to brain 08/25/2013    Past Surgical History  Procedure Laterality Date  . Breast biopsy      left  . Axillary node dissection      biopsy  . Breast biopsy      right  . Eye surgery      laser retinal tears  . Acdf    . Cervical disc surgery      ant. fusion with plate and screws  . Cholecystectomy    . Cesarean section    . Tubal ligation    .  Reproductive      Removal of rt reproductive system  . Dilatation and currettage    . Mastectomy      rt. radical  29 lymph nodes  . Portacath placement  2009  . Appendectomy    . Partial hysterectomy      Royann Shivers MS,RD,LDN,CSG Office: #[phone] Pager: [phone]

## 2013-09-09 NOTE — Progress Notes (Signed)
Inpatient Diabetes Program Recommendations  AACE/ADA: New Consensus Statement on Inpatient Glycemic Control (2013)  Target Ranges:  Prepandial:   less than 140 mg/dL      Peak postprandial:   less than 180 mg/dL (1-2 hours)      Critically ill patients:  140 - 180 mg/dL   Results for Kristen Parker, Kristen Parker (MRN [id number]) as of 09/09/2013 07:40  Ref. Range 09/19/2013 06:09 09/24/2013 15:00 09/17/2013 16:20 09/26/2013 20:59  Glucose-Capillary Latest Range: 70-99 mg/dL 119 (H) 147 (H) 829 (H) 233 (H)  Results for Kristen Parker, Kristen Parker (MRN [id number]) as of 09/09/2013 07:40  Ref. Range 09/11/2013 06:56  Hemoglobin A1C Latest Range: <5.7 % 8.0 (H)  Glucose Latest Range: 70-99 mg/dL 784 (H)    Inpatient Diabetes Program Recommendations Insulin - Basal: Please consider ordering low dose basal insulin; recommend starting with Levemir 10 units dialy and adjust accordingly.  Note: Patient has a history of diabetes and according to the home medication list patient takes Metformin 500 mg daily and Janumet [phone] mg (one to two tabs depending on blood sugar while on steroids) daily as an outpatient for diabetes management.  Currently, patient is ordered to receive Novolog 0-15 units AC and Novolog 0-5 units HS for inpatient glycemic control.  Fasting lab glucose this morning was 283 mg/dl.  Please consider ordering low dose basal insulin; recommend starting with Levemir 10 units daily and adjust accordingly.  Will continue to follow.  Thanks, Orlando Penner, RN, MSN, CCRN Diabetes Coordinator Inpatient Diabetes Program [phone] (Team Pager) [phone] ([facility] office) [phone] [facility] office)

## 2013-09-09 NOTE — Progress Notes (Signed)
INITIAL NUTRITION ASSESSMENT  DOCUMENTATION CODES Per approved criteria  -Obesity Unspecified   INTERVENTION: Follow for diet advancement and goals of care.   NUTRITION DIAGNOSIS: Inadequate oral intake related to decreased appetite, inability to eat due to mental status as evidenced by NPO.   Goal: Pt will meet nutrition needs as able  Monitor:  Diet advancement, goals of care, weight changes, labs, skin integrity, changes in status  Reason for Assessment: MST=2  62 y.o. female  Admitting Dx: Acute encephalopathy  ASSESSMENT: Pt admitted for acute encephalopathy. H&P reports weakness, AMS, and poor appetite x 5 days.  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 She recently started radiation treatments last week. Pt continues to decline with cancer treatments.  Wt fluctuates and wt changes are not clinically significant (13# (5.9%) wt loss x 1 year, 9# (4.3%) wt loss x 6 months, 0% wt loss x 3 months, 7# (3.2%) wt loss x 1 month, and 0% wt loss x 1 week).  Pt is currently NPO and sedated. Unable to perform nutrition focuses physical exam at this time. Family members were unavailable during time of visit.    Height: Ht Readings from Last 1 Encounters:  Sep 16, 2013 5\' 5"  (1.651 m)    Weight: Wt Readings from Last 1 Encounters:  09-16-2013 207 lb (93.895 kg)    Ideal Body Weight: 125#  % Ideal Body Weight: 166%  Wt Readings from Last 10 Encounters:  2013-09-16 207 lb (93.895 kg)  09/01/13 207 lb 14.4 oz (94.303 kg)  08/31/13 214 lb (97.07 kg)  08/03/13 214 lb (97.07 kg)  07/20/13 213 lb (96.616 kg)  07/06/13 211 lb (95.709 kg)  06/22/13 207 lb 12.8 oz (94.257 kg)  05/28/13 207 lb 12.8 oz (94.257 kg)  05/25/13 212 lb 12.8 oz (96.525 kg)  05/11/13 213 lb 9.6 oz (96.888 kg)    Usual Body Weight: 215#  % Usual Body Weight: 196%  BMI:  Body mass index is 34.45 kg/(m^2). Meets criteria for obesity, class I.   Estimated Nutritional Needs: Kcal: 2000-2300  daily Protein: 117-140 grams daily Fluid: 2.0-2.3 L daily  Skin: No skin issues noted  Diet Order: NPO  EDUCATION NEEDS: -Education not appropriate at this time   Intake/Output Summary (Last 24 hours) at 09/09/13 1357 Last data filed at 09/09/13 0600  Gross per 24 hour  Intake 1427.08 ml  Output   2250 ml  Net -822.92 ml    Last BM: 09/04/13   Labs:   Recent Labs Lab 09/03/13 1651 09/16/13 0656 09/09/13 0507  NA  --  129* 128*  K  --  4.3 4.0  CL  --  93* 92*  CO2  --  23 20  BUN 29* 29* 19  CREATININE 0.83 0.64 0.49*  CALCIUM  --  9.6 9.3  GLUCOSE  --  332* 283*    CBG (last 3)   Recent Labs  2013-09-16 1620 09/16/13 2059 09/09/13 0726  GLUCAP 271* 233* 277*    Scheduled Meds: . clotrimazole  1 Applicatorful Vaginal QHS  . dexamethasone  4 mg Intravenous Q12H  . enoxaparin (LOVENOX) injection  40 mg Subcutaneous Q24H  . insulin aspart  0-20 Units Subcutaneous TID WC  . insulin aspart  0-5 Units Subcutaneous QHS  . insulin detemir  5 Units Subcutaneous Daily  . phenytoin (DILANTIN) IV  100 mg Intravenous Q8H  . sodium chloride  3 mL Intravenous Q12H    Continuous Infusions: . sodium chloride 125 mL/hr at 09/09/13  1254    Past Medical History  Diagnosis Date  . Diabetes mellitus   . Graves disease   . Cancer     breast  . Breast CA 05/22/2011  . Thyroid disease     Graves disease/iodine ablation 16 yrs ago  . Hypertension   . Radiation 11/29/09-12/20/09    T-spine to T-6-upper T11  . Bone metastases     T-spine  . Bone metastases 06/20/2011  . 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 History of radiation therapy 10/06/08-11/28/08    R breast  . Metastasis to brain 08/25/2013    Past Surgical History  Procedure Laterality Date  . Breast biopsy      left  . Axillary node dissection      biopsy  . Breast biopsy      right  . Eye surgery      laser retinal tears  . Acdf    . Cervical disc surgery      ant.  fusion with plate and screws  . Cholecystectomy    . Cesarean section    . Tubal ligation    . Reproductive      Removal of rt reproductive system  . Dilatation and currettage    . Mastectomy      rt. radical  29 lymph nodes  . Portacath placement  2009  . Appendectomy    . Partial hysterectomy      Komal Stangelo A. Mayford Knife, RD, LDN Pager: [phone]

## 2013-09-09 NOTE — Progress Notes (Signed)
Reviewed lumbar puncture results with Dr. Luciana Axe, on call for Infectious Diseases.  It was his opinion that results were generally unimpressive. They certainly did not reflect a bacterial meningitis and therefore antibacterials were not recommended.  Herpes simplex meningitis/encephalitis could be a possibility, but felt to be less likely. With high opening pressures, cryptococcal meningitis would also be a consideration.  Although overall, results were not convincing in any direction. He did not feel that starting the patient on acyclovir until HSV pcr was available would be unreasonable.  He also recommended rechecking MRI brain, since patient's clinical condition has changed/deteriorated since last study.  Kristen Parker

## 2013-09-09 NOTE — Procedures (Signed)
Preprocedure Dx: Metastatic breast cancer to brain, abnormal MRI with question leptomeningeal spread Postprocedure Dx: Metastatic breast cancer to brain, abnormal MRI with question leptomeningeal spread Procedure:  Fluoroscopically guided lumbar puncture Radiologist:  Tyron Russell Anesthesia:  2 ml of 1% lidocaine Specimen:  10 ml CSF, clear colorless EBL:   None Opening pressure: Performed PRONE: 36 cm H2O Complications: None

## 2013-09-09 NOTE — Care Management Note (Unsigned)
    Page 1 of 1   09/09/2013     10:28:35 AM   CARE MANAGEMENT NOTE 09/09/2013  Patient:  Kristen Parker, Kristen Parker   Account Number:  [id number]  Date Initiated:  09/09/2013  Documentation initiated by:  Sharrie Rothman  Subjective/Objective Assessment:   Pt admitted from home with hyponatremia and metabolic encephalopathy. Pt also has breast cancer with mets to the brain. Pt lives with her husband and is active with [facility]. Pt continues to have decline with cancer treatments. Pts husband and     Action/Plan:   children are very active in the care of the pt. Will continue to follow for discharge planning needs.   Anticipated DC Date:  09/13/2013   Anticipated DC Plan:  HOME W HOME HEALTH SERVICES      DC Planning Services  CM consult      Choice offered to / List presented to:             Status of service:  In process, will continue to follow Medicare Important Message given?   (If response is "NO", the following Medicare IM given date fields will be blank) Date Medicare IM given:   Date Additional Medicare IM given:    Discharge Disposition:  HOME W HOME HEALTH SERVICES  Per UR Regulation:    If discussed at Long Length of Stay Meetings, dates discussed:    Comments:  09/09/13 1030 Arlyss Queen, RN BSN CM

## 2013-09-09 NOTE — Progress Notes (Signed)
Patient seen and examined, above note reveiwed.  Patient has been admitted with worsening mental status/lethargy and generalized weakness. On today's visit, her husband reports that she had received Ativan approximately 15 mins prior to my arrival. The patient does not arouse to voice or tactile stimuli.  She cannot participate in history.  Exam is unchanged from yesterday.  Appreciate oncology/neurology input.  The etiology of her mental status is not entirely clear.  She has been started on dilantin with EEG results pending. Oncology does not feel that her metastatic brain disease is causing her current presentation. Certainly her metabolic issues could be playing a role, although her hyponatremia is only mild. Agree with pursuing lumbar puncture at this point for further data. Cytology, glucose/protein, cell count and culture have been ordered by oncology. I have added HSV PCR as well as cryptococcal antigen for further evaluation. Her leukocytosis appears to be somewhat chronic which may be exacerbated by IV steroids. She is afebrile. Will likely need to empirically start antibiotics until further data from lumbar puncture is obtained. We'll also add ABG as well as ammonia to complete encephalopathy workup. 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 Will wait for further serum osmolarity to return. Continue current treatments for now.  [name],[name]

## 2013-09-09 NOTE — Progress Notes (Signed)
Pt at CT. MD paged about scheduled ayclovir and concern about patency of the left chest port. MD ordered Peripheral IV for meds to be given through. Will attempt to place IV when pt comes back from CT.

## 2013-09-09 NOTE — Progress Notes (Signed)
Lumbar puncture complete no signs of distress  

## 2013-09-09 NOTE — Progress Notes (Signed)
Kristen Parker is a patient on dept 300.Cancer center staff was contacted to access port. Portacath located lt chest wall accessed with  H 20 needle. No blood return and tubing is prominent above port site. Port flushes with some resistance. Portacath flushed with 20ml NS and capped.Procedure without incident. Patient tolerated procedure well. Reported to Riki Altes Rn condition of port. APCC guidelines recommend a dye study prior to any medications being administered via port.

## 2013-09-09 NOTE — Consult Note (Signed)
[facility] Calvin Chura A. Gerilyn Pilgrim, MD     [email or web address]          Kristen Parker is an 62 y.o. female.   ASSESSMENT/PLAN:  Marked encephalopathy from multifactorial causes including multiple metastatic brain lesions, medication effect and possible seizures. The patient has been started on loading dose of Dilantin by the hospitalist. EEG is being done and will be evaluated. For now we will continue with the Dilantin and see how she progresses. Given the overall clinical picture, there is induration she given for supportive care/comfort measures.  Marked gait impairment due to the above.  The patient is a 62 year old female who presents with a three-week history of worsening encephalopathy/confusion responsiveness and gait impairment. The patient was treated by a week ago for progressive confusion and was found to have a urinary tract infection. She does have a history of metastatic brain cancer and underwent a single course of radiation treatment. It appears that this apparently made things worse at least chronologically per the husband. It appears that the patient has had a few episodes of shaking of the legs and upper extremities associated with episodic worsening of her cognitive impairment. She subsequently has been started on Dilantin by the hospitalist. Additionally, she has been given IV pain medications and benzodiazepines. The husband reports that she has had problems with headaches and visual problems. Review of systems otherwise limited given the cognitive impairment.   GENERAL: This is an obese female who is unresponsive in the bed.  HEENT:  Neck is supple. Head is normocephalic. She has a bruise involving the shin on the left side apparently from falling.  ABDOMEN: soft  EXTREMITIES: No edema   BACK: Unremarkable.  SKIN: Normal by inspection.    MENTAL STATUS: The patient lays in bed with eyes closed. There is no eye opening even to deep painful stimuli. She  moans and groans. She does not follow commands.  CRANIAL NERVES: Pupils are equal, round and reactive to light; extra ocular movements are full with oculocephalic reflexes, there is no significant nystagmus;  upper and lower facial muscles are normal in strength and symmetric, there is no flattening of the nasolabial folds.  MOTOR: She seems to move the right side less. There is increased tone involving the right upper extremity. The right leg is abducted and externally rotated. She moves the left side at least 3/5 to the painful stimuli.  COORDINATION: There is no dysmetria or tremors noted.  REFLEXES: Deep tendon reflexes are symmetrical and normal. Plantar responses are flexor bilaterally.   SENSATION: She responds to pain to deep painful stimuli bilaterally.     Past Medical History  Diagnosis Date  . Diabetes mellitus   . Graves disease   . Cancer     breast  . Breast CA 05/22/2011  . Thyroid disease     Graves disease/iodine ablation 16 yrs ago  . Hypertension   . Radiation 11/29/09-12/20/09    T-spine to T-6-upper T11  . Bone metastases     T-spine  . Bone metastases 06/20/2011  . 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 History of radiation therapy 10/06/08-11/28/08    R breast  . Metastasis to brain 08/25/2013    Past Surgical History  Procedure Laterality Date  . Breast biopsy      left  . Axillary node dissection      biopsy  . Breast biopsy      right  . Eye surgery      laser retinal tears  .  [facility]    . Cervical disc surgery      ant. fusion with plate and screws  . Cholecystectomy    . Cesarean section    . Tubal ligation    . Reproductive      Removal of rt reproductive system  . Dilatation and currettage    . Mastectomy      rt. radical  29 lymph nodes  . Portacath placement  2009  . Appendectomy    . Partial hysterectomy      Family History  Problem Relation Age of Onset  . Cancer Father     Lung, Prostate  . COPD  Father   . Heart disease Father     Congestive heart failure  . Cancer Mother     Cervical  . Cancer Paternal Aunt     Breast  . Cancer Paternal Aunt     Ovarian    Social History:  reports that she quit smoking about 5 years ago. Her smoking use included Cigarettes. She has a 15 pack-year smoking history. She has never used smokeless tobacco. She reports that she does not drink alcohol or use illicit drugs.  Allergies:  Allergies  Allergen Reactions  . Betadine [Povidone Iodine] Rash  . Erythromycin Other (See Comments)    Thrush   . Nsaids Other (See Comments)    Blisters  . Tape     Paper tape causes blisters.     Medications: Prior to Admission medications   Medication Sig Start Date End Date Taking? Authorizing Provider  Calcium Carb-Cholecalciferol (CALCIUM 1000 + D PO) Take 1 tablet by mouth daily.   Yes Historical Provider, MD  Carboxymethylcellulose Sodium (REFRESH LIQUIGEL OP) Apply to eye at bedtime. Coats eyelids @@HS    Yes Historical Provider, MD  Cholecalciferol (VITAMIN D PO) Take 1,000 Units by mouth daily.     Yes Historical Provider, MD  ciprofloxacin (CIPRO) 500 MG tablet Take 1 tablet (500 mg total) by mouth every 12 (twelve) hours. 08/31/13  Yes Roney Marion, MD  clonazePAM (KLONOPIN) 0.5 MG tablet Take 0.5-1 mg by mouth at bedtime.   Yes Historical Provider, MD  dexamethasone (DECADRON) 4 MG tablet Take 2 tablets (8 mg total) by mouth 2 (two) times daily with a meal. 08/25/13  Yes Ellouise Newer, PA-C  Docusate Calcium (STOOL SOFTENER PO) Take 1 capsule by mouth daily as needed (taking 2 - 4 daily).    Yes Historical Provider, MD  Fesoterodine Fumarate (TOVIAZ) 8 MG TB24 Take 8 mg by mouth daily.   Yes Historical Provider, MD  HYDROcodone-acetaminophen (NORCO/VICODIN) 5-325 MG per tablet Take 1 tablet by mouth every 4 (four) hours as needed for pain. 08/25/13  Yes Maurine Minister Kefalas, PA-C  levothyroxine (SYNTHROID, LEVOTHROID) 125 MCG tablet Take 125 mcg by  mouth daily.     Yes Historical Provider, MD  lisinopril (PRINIVIL,ZESTRIL) 10 MG tablet Take 10 mg by mouth daily.     Yes Historical Provider, MD  LORazepam (ATIVAN) 1 MG tablet Take 1 tablet (1 mg total) by mouth 1 day or 1 dose. Take 30 minutes prior to radiation treatment for anxiety. May also take Q 6 hr as needed for nausea/vomiting. 09/03/13  Yes Jonna Coup, MD  magnesium hydroxide (PHILLIPS CHEWS) 311 MG CHEW Chew 311 mg by mouth as needed.   Yes Historical Provider, MD  megestrol (MEGACE) 40 MG tablet Take 1 tablet (40 mg total) by mouth daily. 08/20/13  Yes Ellouise Newer, PA-C  metFORMIN (  GLUCOPHAGE) 500 MG tablet Take 500 mg by mouth daily.   Yes Historical Provider, MD  mirabegron ER (MYRBETRIQ) 25 MG TB24 Take 25 mg by mouth daily.   Yes Historical Provider, MD  omeprazole (PRILOSEC) 20 MG capsule Take 1 capsule (20 mg total) by mouth daily. 03/25/13  Yes Ellouise Newer, PA-C  Polyethyl Glycol-Propyl Glycol (SYSTANE) 0.4-0.3 % SOLN Apply 2 drops to eye daily as needed (dry eyes).    Yes Historical Provider, MD  potassium chloride SA (K-DUR,KLOR-CON) 20 MEQ tablet Take 1 tablet (20 mEq total) by mouth 2 (two) times daily. 03/16/13  Yes Randall An, MD  SitaGLIPtin-MetFORMIN HCl (JANUMET XR) [phone] MG TB24 Take 1 tablet by mouth daily. Per Dr. Regino Schultze, pt states she is to take 1 -2 tablets daily depending on blood sugars, while on steroids   Yes Historical Provider, MD  venlafaxine (EFFEXOR) 75 MG tablet Take 1 tablet (75 mg total) by mouth at bedtime. 07/01/13  Yes Ellouise Newer, PA-C  Zoledronic Acid (ZOMETA IV) Inject into the vein every 30 (thirty) days.     Yes Historical Provider, MD    Scheduled Meds: . dexamethasone  4 mg Intravenous Q12H  . enoxaparin (LOVENOX) injection  40 mg Subcutaneous Q24H  . insulin aspart  0-15 Units Subcutaneous TID WC  . insulin aspart  0-5 Units Subcutaneous QHS  . phenytoin (DILANTIN) IV  100 mg Intravenous Q8H  . sodium chloride   3 mL Intravenous Q12H   Continuous Infusions: . sodium chloride 125 mL/hr at 09/27/2013 1835   PRN Meds:.acetaminophen, acetaminophen, HYDROmorphone (DILAUDID) injection, LORazepam, ondansetron (ZOFRAN) IV, ondansetron   Blood pressure 141/71, pulse 98, temperature 98.2 F (36.8 C), temperature source Axillary, resp. rate 15, height 5\' 5"  (1.651 m), weight 93.895 kg (207 lb), SpO2 96.00%.   Results for orders placed during the hospital encounter of 09/30/2013 (from the past 48 hour(s))  GLUCOSE, CAPILLARY     Status: Abnormal   Collection Time    09/07/2013  6:09 AM      Result Value Range   Glucose-Capillary 307 (*) 70 - 99 mg/dL  CBC WITH DIFFERENTIAL     Status: Abnormal   Collection Time    09/11/2013  6:56 AM      Result Value Range   WBC 26.5 (*) 4.0 - 10.5 K/uL   RBC 4.14  3.87 - 5.11 MIL/uL   Hemoglobin 13.0  12.0 - 15.0 g/dL   HCT 16.1  09.6 - 04.5 %   MCV 96.6  78.0 - 100.0 fL   MCH 31.4  26.0 - 34.0 pg   MCHC 32.5  30.0 - 36.0 g/dL   RDW 40.9 (*) 81.1 - 91.4 %   Platelets 322  150 - 400 K/uL   Neutrophils Relative % 93 (*) 43 - 77 %   Neutro Abs 24.8 (*) 1.7 - 7.7 K/uL   Lymphocytes Relative 4 (*) 12 - 46 %   Lymphs Abs 1.1  0.7 - 4.0 K/uL   Monocytes Relative 2 (*) 3 - 12 %   Monocytes Absolute 0.6  0.1 - 1.0 K/uL   Eosinophils Relative 0  0 - 5 %   Eosinophils Absolute 0.0  0.0 - 0.7 K/uL   Basophils Relative 0  0 - 1 %   Basophils Absolute 0.0  0.0 - 0.1 K/uL  COMPREHENSIVE METABOLIC PANEL     Status: Abnormal   Collection Time    09/18/2013  6:56 AM  Result Value Range   Sodium 129 (*) 135 - 145 mEq/L   Potassium 4.3  3.5 - 5.1 mEq/L   Chloride 93 (*) 96 - 112 mEq/L   CO2 23  19 - 32 mEq/L   Glucose, Bld 332 (*) 70 - 99 mg/dL   BUN 29 (*) 6 - 23 mg/dL   Creatinine, Ser 1.61  0.50 - 1.10 mg/dL   Calcium 9.6  8.4 - 09.6 mg/dL   Total Protein 6.8  6.0 - 8.3 g/dL   Albumin 3.2 (*) 3.5 - 5.2 g/dL   AST 19  0 - 37 U/L   ALT 13  0 - 35 U/L   Alkaline  Phosphatase 118 (*) 39 - 117 U/L   Total Bilirubin 0.4  0.3 - 1.2 mg/dL   GFR calc non Af Amer >90  >90 mL/min   GFR calc Af Amer >90  >90 mL/min   Comment: (NOTE)     The eGFR has been calculated using the CKD EPI equation.     This calculation has not been validated in all clinical situations.     eGFR's persistently <90 mL/min signify possible Chronic Kidney     Disease.  LACTIC ACID, PLASMA     Status: None   Collection Time    09-21-13  6:56 AM      Result Value Range   Lactic Acid, Venous 1.8  0.5 - 2.2 mmol/L  TSH     Status: None   Collection Time    21-Sep-2013  6:56 AM      Result Value Range   TSH 2.110  0.350 - 4.500 uIU/mL   Comment: Performed at [facility]  HEMOGLOBIN A1C     Status: Abnormal   Collection Time    September 21, 2013  6:56 AM      Result Value Range   Hemoglobin A1C 8.0 (*) <5.7 %   Comment: (NOTE)                                                                               According to the ADA Clinical Practice Recommendations for 2011, when     HbA1c is used as a screening test:      >=6.5%   Diagnostic of Diabetes Mellitus               (if abnormal result is confirmed)     5.7-6.4%   Increased risk of developing Diabetes Mellitus     References:Diagnosis and Classification of Diabetes Mellitus,Diabetes     Care,2011,34(Suppl 1):S62-S69 and Standards of Medical Care in             Diabetes - 2011,Diabetes Care,2011,34 (Suppl 1):S11-S61.   Mean Plasma Glucose 183 (*) <117 mg/dL   Comment: Performed at [facility]  URINALYSIS, ROUTINE W REFLEX MICROSCOPIC     Status: Abnormal   Collection Time    2013-09-21  8:32 AM      Result Value Range   Color, Urine YELLOW  YELLOW   APPearance CLEAR  CLEAR   Specific Gravity, Urine >1.030 (*) 1.005 - 1.030   pH 6.0  5.0 - 8.0   Glucose, UA >1000 (*) NEGATIVE mg/dL  Hgb urine dipstick NEGATIVE  NEGATIVE   Bilirubin Urine NEGATIVE  NEGATIVE   Ketones, ur 15 (*) NEGATIVE mg/dL   Protein, ur  NEGATIVE  NEGATIVE mg/dL   Urobilinogen, UA 0.2  0.0 - 1.0 mg/dL   Nitrite NEGATIVE  NEGATIVE   Leukocytes, UA NEGATIVE  NEGATIVE  URINE MICROSCOPIC-ADD ON     Status: None   Collection Time    09-16-13  8:32 AM      Result Value Range   Urine-Other YEAST     Comment: MANY YEAST  GLUCOSE, CAPILLARY     Status: Abnormal   Collection Time    September 16, 2013  3:00 PM      Result Value Range   Glucose-Capillary 239 (*) 70 - 99 mg/dL  GLUCOSE, CAPILLARY     Status: Abnormal   Collection Time    09/16/13  4:20 PM      Result Value Range   Glucose-Capillary 271 (*) 70 - 99 mg/dL  GLUCOSE, CAPILLARY     Status: Abnormal   Collection Time    2013-09-16  8:59 PM      Result Value Range   Glucose-Capillary 233 (*) 70 - 99 mg/dL   Comment 1 Notify RN     Comment 2 Documented in Chart    COMPREHENSIVE METABOLIC PANEL     Status: Abnormal   Collection Time    09/09/13  5:07 AM      Result Value Range   Sodium 128 (*) 135 - 145 mEq/L   Potassium 4.0  3.5 - 5.1 mEq/L   Chloride 92 (*) 96 - 112 mEq/L   CO2 20  19 - 32 mEq/L   Glucose, Bld 283 (*) 70 - 99 mg/dL   BUN 19  6 - 23 mg/dL   Creatinine, Ser 4.09 (*) 0.50 - 1.10 mg/dL   Calcium 9.3  8.4 - 81.1 mg/dL   Total Protein 6.8  6.0 - 8.3 g/dL   Albumin 3.3 (*) 3.5 - 5.2 g/dL   AST 18  0 - 37 U/L   ALT 12  0 - 35 U/L   Alkaline Phosphatase 116  39 - 117 U/L   Total Bilirubin 0.4  0.3 - 1.2 mg/dL   GFR calc non Af Amer >90  >90 mL/min   GFR calc Af Amer >90  >90 mL/min   Comment: (NOTE)     The eGFR has been calculated using the CKD EPI equation.     This calculation has not been validated in all clinical situations.     eGFR's persistently <90 mL/min signify possible Chronic Kidney     Disease.  CBC     Status: Abnormal   Collection Time    09/09/13  5:07 AM      Result Value Range   WBC 33.2 (*) 4.0 - 10.5 K/uL   RBC 4.31  3.87 - 5.11 MIL/uL   Hemoglobin 13.5  12.0 - 15.0 g/dL   HCT 91.4  78.2 - 95.6 %   MCV 96.8  78.0 - 100.0 fL     MCH 31.3  26.0 - 34.0 pg   MCHC 32.4  30.0 - 36.0 g/dL   RDW 21.3 (*) 08.6 - 57.8 %   Platelets 351  150 - 400 K/uL  GLUCOSE, CAPILLARY     Status: Abnormal   Collection Time    09/09/13  7:26 AM      Result Value Range   Glucose-Capillary 277 (*) 70 - 99 mg/dL  Dg Chest 1 View  09/03/2013   CLINICAL DATA:  Lethargic, recent brain tumor, history of breast cancer  EXAM: PORTABLE CHEST - 1 VIEW  COMPARISON:  08/31/2013  FINDINGS: Low lung volumes. No focal consolidation. No pleural effusion or pneumothorax.  Heart is top-normal in size.  Stable left chest power port.  Status post right mastectomy with surgical clips in the right chest wall/axilla.  Cervical spine fixation hardware.  IMPRESSION: No evidence of acute cardiopulmonary disease.   Electronically Signed   By: Charline Bills M.D.   On: 09/29/2013 07:55   Ct Head Wo Contrast  09/13/2013   CLINICAL DATA:  Agitation, altered mental status, history breast cancer with CNS metastasis, hyperglycemia  EXAM: CT HEAD WITHOUT CONTRAST  TECHNIQUE: Contiguous axial images were obtained from the base of the skull through the vertex without intravenous contrast.  COMPARISON:  08/31/2013, MRI brain 09/01/2013  FINDINGS: Motion artifacts, for which repeat imaging was performed.  Generalized atrophy.  Mild prominence of ventricular system, stable since previous exam.  No midline shift or mass effect.  Small vessel chronic ischemic changes in deep cerebral white matter.  Known right cerebellar metastasis is not well visualized.  Small lenticular high attenuation focus along the anterior falx 9 x 3 mm, by prior MR a small small meningioma.  Compared to previous exam, slight effacement of sulci is identified at the vertex particularly at left vertex is seen, suspect differences in slice averaging versus previous exam  No definite CNS metastasis was seen at this site on recent MR.  No definite intracranial hemorrhage, additional mass lesion, or evidence of  acute infarction.  No extra-axial fluid collections.  Visualized sinuses and mastoid air cells clear.  Numerous sclerotic foci throughout calvaria and skull base consistent with widespread osseous metastatic disease.  CT imaging of the cervical spine was inadvertently performed. Prior anterior fusion of C5-C7.  Scattered osseous metastases.  Prevertebral soft tissues normal thickness.  Motion artifacts at C2.  No definite acute fracture, subluxation or bone destruction.  IMPRESSION: Atrophy with small vessel chronic ischemic changes of deep cerebral white matter.  Small falcine meningioma.  Extensive osseous metastatic disease.  No definite acute intracranial abnormalities.   Electronically Signed   By: Ulyses Southward M.D.   On: 09/11/2013 09:23     BRAIN MRI [id number] This examination re- demonstrates the inferior cerebellar metastasis  on the right measuring 15 x 11 mm with minimal vasogenic edema.  Additionally demonstrated are 4 more punctate cerebellar metastases,  a single punctate left occipital metastasis, and 3 punctate right  cerebral hemispheric metastases.    Kenetra Hildenbrand A. Gerilyn Pilgrim, M.D.  Diplomate, Biomedical engineer of Psychiatry and Neurology ( Neurology). 09/09/2013, 8:42 AM

## 2013-09-09 NOTE — Consult Note (Signed)
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    Oncology treatment history:  Following right modiefied radical mastectomy, she was initially treated with Letrozole and she experienced an excellent response, but then showed evidence of progression and she was therefore switched to Tamoxifen therapy with only a mild response recorded.  This was followed with Faslodex and unfortunately, her malignancy did not respond to this regimen.  Subsequently, she was started on Docetaxel systemic therapy every 3 weeks x 6 cycles with a great response, but rapid failure.  She was then placed on Doxil beginning on 01/05/2013 and received two cycles, last treated with Doxil on 02/02/2013, when she developed significant palmar-plantar erythrodysesthesia and this chemotherapy regimen was aborted.  She then started Gemcitabine/Navelbine beginning on 03/02/2013 and received 12 cycles of this regimen that was tolerated extremely well and was last treated on 08/03/2013.  PET scan following last cycle of chemotherapy demonstrated stability of disease systemically and it was decided to provide the patient a chemotherapy-holiday as she planned for a trip [place] and [facility] over the following 4 weeks.  Two weeks prior to [facility] trip, she complained of a  headache and difficulty ambulating.  MRI brain performed which demonstrated a right inferior cerebellar metastasis.  She was thus referred to Radiation Oncology, Dr. Mitzi Hansen (Rad Onc), for consideration of SRS to this single metastasis and this was completed on 09/03/2013.  In the interim, her case was presented to brain tumor board and multiple other punctate brain lesions were identified and therefore radiation oncology performed SRS to the single target lesion with another session scheduled to treat 8 other foci's.  When she returned to the Radiation Oncology suite for radiation, the technicians were unable to administer the second session due to the patient's inability to lay still and therefore this session was postponed.  Kristen Parker presented to the ED on 08/31/2013 and was diagnosed with UTI and discharged with antibiotics.   She re-presented to the ED on 09/28/2013 with progressive and worsening confusion and lethargy.  ED physician contacted the clinic to see if the patient would be appropriate for admission to [facility] versus [facility].  I recommended admission here to [facility].  I spoke with Dr. Mitzi Hansen, Radiation Oncologist, who educated me regarding the patient's radiation care.  Kristen Parker has completed SRS therapy to the index lesion in the inferior right cerebellum.  As mentioned above under her oncology treatment history, subsequent SRS was intended to treat very small, subclinical foci.  After our discussion regarding the patient's brain metastases, it is believed that from an oncology standpoint, her brain metastases are not the cause of her presenting symptoms as the lesions are small with negligible edema.  I saw the patient this AM in bed.  She is resting comfortably.  She was given 0.5 mg of Dilaudid at 0702 hours and Ativan 1 mg IV at 0701 hours.  As a result, the patient is not arousable, not even to pain and therefore conversation with patient is nonexistent.  No  family at the bedside.   Chart is reviewed.    Labs are obvious for abnormalities with hyperglycemia (secondary to Decadron requirements), dehydration with NUU:VOZDGUYQIH ratio greater than 20, hyponatremia secondary to hyperglycemia and dehydration.  Otherwise, CBC shows a mild leukocytosis secondary to stress and decadron.  Hgb is WNL and platelets are WNL.  At this time, we believe that although she has metastatic breast cancer to bone and brain, her major hospitalization issues are secondary to electrolyte abnormality, dehydration, and hyperglycemia and complications of these.  PAST MEDICAL HISTORY:   Past Medical History  Diagnosis Date  . Diabetes mellitus   . Graves disease   . Cancer     breast  . Breast CA 05/22/2011  . Thyroid disease     Graves disease/iodine ablation 16 yrs ago  . Hypertension   . Radiation 11/29/09-12/20/09    T-spine to T-6-upper T11  . Bone metastases     T-spine  . Bone metastases 06/20/2011  . 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 History of radiation therapy 10/06/08-11/28/08    R breast  . 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 Betadine [Povidone Iodine] Rash  . Erythromycin Other (See Comments)    Thrush   . Nsaids Other (See Comments)    Blisters  . Tape     Paper tape causes blisters.       MEDICATIONS: I have reviewed the patient's current medications.     PAST SURGICAL HISTORY Past Surgical History  Procedure Laterality Date  . Breast biopsy      left  . Axillary node dissection      biopsy  . Breast biopsy      right  . Eye surgery      laser retinal tears  . Acdf    . Cervical disc surgery      ant. fusion with plate and screws  . Cholecystectomy    . Cesarean section    . Tubal ligation    . Reproductive      Removal of rt reproductive system  . Dilatation and currettage    . Mastectomy      rt. radical  29 lymph nodes  . Portacath placement  2009   . Appendectomy    . Partial hysterectomy      FAMILY HISTORY: Family History  Problem Relation Age of Onset  . Cancer Father     Lung, Prostate  . COPD Father   . Heart disease Father     Congestive heart failure  . Cancer Mother     Cervical  . Cancer Paternal Aunt     Breast  . Cancer Paternal Aunt     Ovarian    SOCIAL HISTORY:  reports that she quit smoking about 5 years ago. Her smoking use included Cigarettes. She has a 15 pack-year smoking history. She has never used smokeless tobacco. She reports that she does not drink alcohol or use illicit drugs.  PERFORMANCE STATUS: The patient's performance status is unknown due to patient's present condition.  PHYSICAL EXAM: Most Recent Vital Signs: Blood pressure 141/71, pulse 98, temperature 98.2 F (36.8 C), temperature source Axillary, resp. rate 15, height 5\' 5"  (1.651 m), weight 207 lb (93.895  kg), SpO2 96.00%. General appearance: no distress and stuporous Head: Normocephalic, without obvious abnormality, atraumatic, ecchymosis on chin Neck: supple, symmetrical, trachea midline Lungs: clear to auscultation bilaterally and anteriorly examed due to patient's stupor Heart: regular rate and rhythm, S1, S2 normal, no murmur, click, rub or gallop Abdomen: normal findings: bowel sounds normal, no masses palpable, no organomegaly, soft, non-tender and symmetric Extremities: extremities normal, atraumatic, no cyanosis or edema Neurologic: Mental status: alertness: stuperous  LABORATORY DATA:  Results for orders placed during the hospital encounter of 2013-09-27 (from the past 48 hour(s))  GLUCOSE, CAPILLARY     Status: Abnormal   Collection Time    2013-09-27  6:09 AM      Result Value Range   Glucose-Capillary 307 (*) 70 - 99 mg/dL  CBC WITH DIFFERENTIAL     Status: Abnormal   Collection Time    2013/09/27  6:56 AM      Result Value Range   WBC 26.5 (*) 4.0 - 10.5 K/uL   RBC 4.14  3.87 - 5.11 MIL/uL   Hemoglobin 13.0  12.0 -  15.0 g/dL   HCT 78.4  69.6 - 29.5 %   MCV 96.6  78.0 - 100.0 fL   MCH 31.4  26.0 - 34.0 pg   MCHC 32.5  30.0 - 36.0 g/dL   RDW 28.4 (*) 13.2 - 44.0 %   Platelets 322  150 - 400 K/uL   Neutrophils Relative % 93 (*) 43 - 77 %   Neutro Abs 24.8 (*) 1.7 - 7.7 K/uL   Lymphocytes Relative 4 (*) 12 - 46 %   Lymphs Abs 1.1  0.7 - 4.0 K/uL   Monocytes Relative 2 (*) 3 - 12 %   Monocytes Absolute 0.6  0.1 - 1.0 K/uL   Eosinophils Relative 0  0 - 5 %   Eosinophils Absolute 0.0  0.0 - 0.7 K/uL   Basophils Relative 0  0 - 1 %   Basophils Absolute 0.0  0.0 - 0.1 K/uL  COMPREHENSIVE METABOLIC PANEL     Status: Abnormal   Collection Time    Sep 27, 2013  6:56 AM      Result Value Range   Sodium 129 (*) 135 - 145 mEq/L   Potassium 4.3  3.5 - 5.1 mEq/L   Chloride 93 (*) 96 - 112 mEq/L   CO2 23  19 - 32 mEq/L   Glucose, Bld 332 (*) 70 - 99 mg/dL   BUN 29 (*) 6 - 23 mg/dL   Creatinine, Ser 1.02  0.50 - 1.10 mg/dL   Calcium 9.6  8.4 - 72.5 mg/dL   Total Protein 6.8  6.0 - 8.3 g/dL   Albumin 3.2 (*) 3.5 - 5.2 g/dL   AST 19  0 - 37 U/L   ALT 13  0 - 35 U/L   Alkaline Phosphatase 118 (*) 39 - 117 U/L   Total Bilirubin 0.4  0.3 - 1.2 mg/dL   GFR calc non Af Amer >90  >90 mL/min   GFR calc Af Amer >90  >90 mL/min   Comment: (NOTE)     The eGFR has been calculated using the CKD EPI equation.     This calculation has not been validated in all clinical situations.     eGFR's persistently <90 mL/min signify possible Chronic Kidney     Disease.  LACTIC ACID, PLASMA     Status: None   Collection Time    09-27-13  6:56 AM  Result Value Range   Lactic Acid, Venous 1.8  0.5 - 2.2 mmol/L  TSH     Status: None   Collection Time    10-01-2013  6:56 AM      Result Value Range   TSH 2.110  0.350 - 4.500 uIU/mL   Comment: Performed at [facility]  HEMOGLOBIN A1C     Status: Abnormal   Collection Time    10/01/2013  6:56 AM      Result Value Range   Hemoglobin A1C 8.0 (*) <5.7 %   Comment:  (NOTE)                                                                               According to the ADA Clinical Practice Recommendations for 2011, when     HbA1c is used as a screening test:      >=6.5%   Diagnostic of Diabetes Mellitus               (if abnormal result is confirmed)     5.7-6.4%   Increased risk of developing Diabetes Mellitus     References:Diagnosis and Classification of Diabetes Mellitus,Diabetes     Care,2011,34(Suppl 1):S62-S69 and Standards of Medical Care in             Diabetes - 2011,Diabetes Care,2011,34 (Suppl 1):S11-S61.   Mean Plasma Glucose 183 (*) <117 mg/dL   Comment: Performed at [facility]  URINALYSIS, ROUTINE W REFLEX MICROSCOPIC     Status: Abnormal   Collection Time    10/01/13  8:32 AM      Result Value Range   Color, Urine YELLOW  YELLOW   APPearance CLEAR  CLEAR   Specific Gravity, Urine >1.030 (*) 1.005 - 1.030   pH 6.0  5.0 - 8.0   Glucose, UA >1000 (*) NEGATIVE mg/dL   Hgb urine dipstick NEGATIVE  NEGATIVE   Bilirubin Urine NEGATIVE  NEGATIVE   Ketones, ur 15 (*) NEGATIVE mg/dL   Protein, ur NEGATIVE  NEGATIVE mg/dL   Urobilinogen, UA 0.2  0.0 - 1.0 mg/dL   Nitrite NEGATIVE  NEGATIVE   Leukocytes, UA NEGATIVE  NEGATIVE  URINE MICROSCOPIC-ADD ON     Status: None   Collection Time    October 01, 2013  8:32 AM      Result Value Range   Urine-Other YEAST     Comment: MANY YEAST  GLUCOSE, CAPILLARY     Status: Abnormal   Collection Time    10/01/13  3:00 PM      Result Value Range   Glucose-Capillary 239 (*) 70 - 99 mg/dL  GLUCOSE, CAPILLARY     Status: Abnormal   Collection Time    10/01/13  4:20 PM      Result Value Range   Glucose-Capillary 271 (*) 70 - 99 mg/dL  GLUCOSE, CAPILLARY     Status: Abnormal   Collection Time    10/01/13  8:59 PM      Result Value Range   Glucose-Capillary 233 (*) 70 - 99 mg/dL   Comment 1 Notify RN     Comment 2 Documented in Chart    COMPREHENSIVE METABOLIC PANEL     Status:  Abnormal    Collection Time    09/09/13  5:07 AM      Result Value Range   Sodium 128 (*) 135 - 145 mEq/L   Potassium 4.0  3.5 - 5.1 mEq/L   Chloride 92 (*) 96 - 112 mEq/L   CO2 20  19 - 32 mEq/L   Glucose, Bld 283 (*) 70 - 99 mg/dL   BUN 19  6 - 23 mg/dL   Creatinine, Ser 8.29 (*) 0.50 - 1.10 mg/dL   Calcium 9.3  8.4 - 56.2 mg/dL   Total Protein 6.8  6.0 - 8.3 g/dL   Albumin 3.3 (*) 3.5 - 5.2 g/dL   AST 18  0 - 37 U/L   ALT 12  0 - 35 U/L   Alkaline Phosphatase 116  39 - 117 U/L   Total Bilirubin 0.4  0.3 - 1.2 mg/dL   GFR calc non Af Amer >90  >90 mL/min   GFR calc Af Amer >90  >90 mL/min   Comment: (NOTE)     The eGFR has been calculated using the CKD EPI equation.     This calculation has not been validated in all clinical situations.     eGFR's persistently <90 mL/min signify possible Chronic Kidney     Disease.  CBC     Status: Abnormal   Collection Time    09/09/13  5:07 AM      Result Value Range   WBC 33.2 (*) 4.0 - 10.5 K/uL   RBC 4.31  3.87 - 5.11 MIL/uL   Hemoglobin 13.5  12.0 - 15.0 g/dL   HCT 13.0  86.5 - 78.4 %   MCV 96.8  78.0 - 100.0 fL   MCH 31.3  26.0 - 34.0 pg   MCHC 32.4  30.0 - 36.0 g/dL   RDW 69.6 (*) 29.5 - 28.4 %   Platelets 351  150 - 400 K/uL      RADIOGRAPHY: Dg Chest 1 View  09/11/2013   CLINICAL DATA:  Lethargic, recent brain tumor, history of breast cancer  EXAM: PORTABLE CHEST - 1 VIEW  COMPARISON:  08/31/2013  FINDINGS: Low lung volumes. No focal consolidation. No pleural effusion or pneumothorax.  Heart is top-normal in size.  Stable left chest power port.  Status post right mastectomy with surgical clips in the right chest wall/axilla.  Cervical spine fixation hardware.  IMPRESSION: No evidence of acute cardiopulmonary disease.   Electronically Signed   By: Charline Bills M.D.   On: 10/01/2013 07:55   Ct Head Wo Contrast  09/14/2013   CLINICAL DATA:  Agitation, altered mental status, history breast cancer with CNS metastasis, hyperglycemia  EXAM:  CT HEAD WITHOUT CONTRAST  TECHNIQUE: Contiguous axial images were obtained from the base of the skull through the vertex without intravenous contrast.  COMPARISON:  08/31/2013, MRI brain 09/01/2013  FINDINGS: Motion artifacts, for which repeat imaging was performed.  Generalized atrophy.  Mild prominence of ventricular system, stable since previous exam.  No midline shift or mass effect.  Small vessel chronic ischemic changes in deep cerebral white matter.  Known right cerebellar metastasis is not well visualized.  Small lenticular high attenuation focus along the anterior falx 9 x 3 mm, by prior MR a small small meningioma.  Compared to previous exam, slight effacement of sulci is identified at the vertex particularly at left vertex is seen, suspect differences in slice averaging versus previous exam  No definite CNS metastasis was seen at this site on  recent MR.  No definite intracranial hemorrhage, additional mass lesion, or evidence of acute infarction.  No extra-axial fluid collections.  Visualized sinuses and mastoid air cells clear.  Numerous sclerotic foci throughout calvaria and skull base consistent with widespread osseous metastatic disease.  CT imaging of the cervical spine was inadvertently performed. Prior anterior fusion of C5-C7.  Scattered osseous metastases.  Prevertebral soft tissues normal thickness.  Motion artifacts at C2.  No definite acute fracture, subluxation or bone destruction.  IMPRESSION: Atrophy with small vessel chronic ischemic changes of deep cerebral white matter.  Small falcine meningioma.  Extensive osseous metastatic disease.  No definite acute intracranial abnormalities.   Electronically Signed   By: Ulyses Southward M.D.   On: 09/17/2013 09:23        ASSESSMENT:  1. Hyponatremia 2. Dehydration with EAV:WUJWJXBJYN ratio greater than 20.  Calculated today at 38.8 3. Hyperglycemia 4. Stuporous, secondary to Dilaudid and Ativan 30 minutes prior to examination 5. Metastatic  lobular carcinoma of right breast with bone involvement presenting in September 2009 after a right modified radical mastectomy revealing the aforementioned diagnosis and 26/29 involved lymph nodes.  Recently discovered single brain metastasis in the inferior right cerebellum with possible other foci that are extremely small without edema.  See oncology history above for more details.    PLAN:  1. Chart reviewed 2. Patient stuporous and therefore not participating in discussion 3. Labs reviewed 4. Recommend Lumbar puncture with cytology and additional routine studies to evaluate for subclinical leptomeningeal involvement.  Orders placed. 5. Suspect her hospitalization is secondary to indirect issues from her malignancy as mentioned above. 6. Discussed case with Dr. Mitzi Hansen, Radiation Oncology. 7. DNR discussion is appropriate 8. Will follow along while an inpatient.  All questions were answered. The patient knows to call the clinic with any problems, questions or concerns. We can certainly see the patient much sooner if necessary.  Patient and plan discussed with Dr. Alla German and he is in agreement with the aforementioned.    KEFALAS,THOMAS

## 2013-09-09 NOTE — Progress Notes (Signed)
[facility] PROGRESS NOTE  Kristen Parker ZOX:[id number] DOB: [birth year] DOA: 2013-09-24 PCP: [name], MD  Assessment/Plan: Acute encephalopathy: Little improvement. Infectious workup remains negative. Blood cultures are pending. Appreciate oncology assistance. LP ordered per oncology as oncology doubts symptoms related to brain mets. Concern for seizures thus await EEG results. Dilantin started.  In addition,pt remains hyponatremic, hyperglycemic and somewhat dehydrated. Will add low dose levemir and increase SSI for better glycemic control, will continue IV fluids at 125/hr. Will check urine sodium and osmolality. Will check serum osmolality as well.   Active Problems:  Leukocytosis: trending up today.  Likely related to Decadron. Urinalysis was unremarkable.chest x-ray without acute process. Afebrile. Will continue to monitor closely. Blood cultures pending. LP pending.   Tachycardia: Likely related to dehydration in setting of hyperglycemia. Slight improvement. Continue vigorous IV fluids. Increase SSI and add levemir for improved glycemic control   Hyponatremia: Again likely related to dehydration.Continue  IV fluids. Check serum osmolality and urine sodium and osmolality.    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 apprecieate oncology consult. Patient had started radiation 5 days ago.  Will provide pain medicine as needed   Diabetes: Patient was hyperglycemic on admission. Of note she has been on Decadron for several weeks. Patient is n.p.o. Hemoglobin A1c 8.0. CBG range 230-280. Will increase SSI to resistant and add low dose basal. Monitor  Antineoplastic chemotherapy induced anemia(285.3) chart review indicates currently stable. Will monitor.   Hypertension. Fair control. Patient on lisinopril at home. Will hold this now do to encephalopathy. Will monitor blood pressure. Will provide hydralazine when necessary.   Graves' disease. TSH 2.1.     Code  Status: full Family Communication: sister at bedside Disposition Plan: to be determined   Consultants:  Oncology   neurology  Procedures:  LP 09/09/13  Antibiotics: none HPI/Subjective: Lying in bed snoring. NAD. Obtunded. respers even.  Objective: Filed Vitals:   Sep 24, 2013 2100  BP: 141/71  Pulse: 98  Temp: 98.2 F (36.8 C)  Resp: 15    Intake/Output Summary (Last 24 hours) at 09/09/13 0850 Last data filed at 09/09/13 0600  Gross per 24 hour  Intake 1427.08 ml  Output   2625 ml  Net -1197.92 ml   Filed Weights   09/24/13 1330  Weight: 93.895 kg (207 lb)    Exam:   General:  Obese, flushed, NAD, obtunded likely related to ativan and dilaudid given this am  Cardiovascular: RRR No MGR No LE edema  Respiratory: normal effort slightly shallow. No use of accessory muscles. BS distant but clear to ausculation.  Abdomen: obese soft +BS non-tender to palpation  Musculoskeletal: no cyanosis, no clubbing.    Data Reviewed: Basic Metabolic Panel:  Recent Labs Lab 09/03/13 1651 24-Sep-2013 0656 09/09/13 0507  NA  --  129* 128*  K  --  4.3 4.0  CL  --  93* 92*  CO2  --  23 20  GLUCOSE  --  332* 283*  BUN 29* 29* 19  CREATININE 0.83 0.64 0.49*  CALCIUM  --  9.6 9.3   Liver Function Tests:  Recent Labs Lab 2013-09-24 0656 09/09/13 0507  AST 19 18  ALT 13 12  ALKPHOS 118* 116  BILITOT 0.4 0.4  PROT 6.8 6.8  ALBUMIN 3.2* 3.3*   No results found for this basename: LIPASE, AMYLASE,  in the last 168 hours No results found for this basename: AMMONIA,  in the last 168 hours CBC:  Recent Labs Lab 09/24/13 0656  09/09/13 0507  WBC 26.5* 33.2*  NEUTROABS 24.8*  --   HGB 13.0 13.5  HCT 40.0 41.7  MCV 96.6 96.8  PLT 322 351   Cardiac Enzymes: No results found for this basename: CKTOTAL, CKMB, CKMBINDEX, TROPONINI,  in the last 168 hours BNP (last 3 results)  Recent Labs  06/15/13 0940  PROBNP 63.5   CBG:  Recent Labs Lab Sep 29, 2013 0609  2013-09-29 1500 September 29, 2013 1620 Sep 29, 2013 2059 09/09/13 0726  GLUCAP 307* 239* 271* 233* 277*    Recent Results (from the past 240 hour(s))  URINE CULTURE     Status: None   Collection Time    08/31/13  3:51 PM      Result Value Range Status   Specimen Description URINE, CLEAN CATCH   Final   Special Requests NONE   Final   Culture  Setup Time     Final   Value: 08/31/2013 17:30     Two isolates with different morphologies were identified as the same organism.The most resistant organism was reported.     Performed at [facility] Count     Final   Value: >=100,000 COLONIES/ML     Performed at [facility]   Culture     Final   Value: KLEBSIELLA PNEUMONIAE     Performed at [facility]   Report Status 09/04/2013 FINAL   Final   Organism ID, Bacteria KLEBSIELLA PNEUMONIAE   Final     Studies: Dg Chest 1 View  09/29/2013   CLINICAL DATA:  Lethargic, recent brain tumor, history of breast cancer  EXAM: PORTABLE CHEST - 1 VIEW  COMPARISON:  08/31/2013  FINDINGS: Low lung volumes. No focal consolidation. No pleural effusion or pneumothorax.  Heart is top-normal in size.  Stable left chest power port.  Status post right mastectomy with surgical clips in the right chest wall/axilla.  Cervical spine fixation hardware.  IMPRESSION: No evidence of acute cardiopulmonary disease.   Electronically Signed   By: Charline Bills M.D.   On: 29-Sep-2013 07:55   Ct Head Wo Contrast  09-29-13   CLINICAL DATA:  Agitation, altered mental status, history breast cancer with CNS metastasis, hyperglycemia  EXAM: CT HEAD WITHOUT CONTRAST  TECHNIQUE: Contiguous axial images were obtained from the base of the skull through the vertex without intravenous contrast.  COMPARISON:  08/31/2013, MRI brain 09/01/2013  FINDINGS: Motion artifacts, for which repeat imaging was performed.  Generalized atrophy.  Mild prominence of ventricular system, stable since previous exam.  No midline shift  or mass effect.  Small vessel chronic ischemic changes in deep cerebral white matter.  Known right cerebellar metastasis is not well visualized.  Small lenticular high attenuation focus along the anterior falx 9 x 3 mm, by prior MR a small small meningioma.  Compared to previous exam, slight effacement of sulci is identified at the vertex particularly at left vertex is seen, suspect differences in slice averaging versus previous exam  No definite CNS metastasis was seen at this site on recent MR.  No definite intracranial hemorrhage, additional mass lesion, or evidence of acute infarction.  No extra-axial fluid collections.  Visualized sinuses and mastoid air cells clear.  Numerous sclerotic foci throughout calvaria and skull base consistent with widespread osseous metastatic disease.  CT imaging of the cervical spine was inadvertently performed. Prior anterior fusion of C5-C7.  Scattered osseous metastases.  Prevertebral soft tissues normal thickness.  Motion artifacts at C2.  No definite acute fracture, subluxation  or bone destruction.  IMPRESSION: Atrophy with small vessel chronic ischemic changes of deep cerebral white matter.  Small falcine meningioma.  Extensive osseous metastatic disease.  No definite acute intracranial abnormalities.   Electronically Signed   By: Ulyses Southward M.D.   On: October 06, 2013 09:23    Scheduled Meds: . dexamethasone  4 mg Intravenous Q12H  . enoxaparin (LOVENOX) injection  40 mg Subcutaneous Q24H  . insulin aspart  0-20 Units Subcutaneous TID WC  . insulin aspart  0-5 Units Subcutaneous QHS  . insulin detemir  5 Units Subcutaneous Daily  . phenytoin (DILANTIN) IV  100 mg Intravenous Q8H  . sodium chloride  3 mL Intravenous Q12H   Continuous Infusions: . sodium chloride 125 mL/hr at 10/06/2013 1835    Principal Problem:   Acute encephalopathy Active Problems:   Antineoplastic chemotherapy induced anemia(285.3)   Metastasis to brain   Diabetes   Leukocytosis    Tachycardia   Hyponatremia    Time spent: 40 minutes    [facility] M  [facility] Pager [phone]. If 7PM-7AM, please contact night-coverage at [email or web address], password [facility] 09/09/2013, 8:50 AM  LOS: 1 day

## 2013-09-09 NOTE — Progress Notes (Signed)
EEG Completed; Results Pending  

## 2013-09-10 ENCOUNTER — Inpatient Hospital Stay (HOSPITAL_COMMUNITY): Payer: Medicare Other

## 2013-09-10 DIAGNOSIS — E222 Syndrome of inappropriate secretion of antidiuretic hormone: Secondary | ICD-10-CM | POA: Diagnosis present

## 2013-09-10 DIAGNOSIS — D496 Neoplasm of unspecified behavior of brain: Secondary | ICD-10-CM

## 2013-09-10 LAB — BASIC METABOLIC PANEL
BUN: 18 mg/dL (ref 6–23)
Chloride: 96 mEq/L (ref 96–112)
GFR calc Af Amer: >90 mL/min (ref >90)
GFR calc non Af Amer: >90 mL/min (ref >90)
Glucose, Bld: 237 mg/dL — ABNORMAL HIGH (ref 70–99)
Potassium: 3.1 mEq/L — ABNORMAL LOW (ref 3.5–5.1)

## 2013-09-10 LAB — HERPES SIMPLEX VIRUS(HSV) DNA BY PCR

## 2013-09-10 LAB — GLUCOSE, CAPILLARY
Glucose-Capillary: 229 mg/dL — ABNORMAL HIGH (ref 70–99)
Glucose-Capillary: 261 mg/dL — ABNORMAL HIGH (ref 70–99)

## 2013-09-10 LAB — OSMOLALITY, URINE: Osmolality, Ur: 760 mOsm/kg (ref 390–1090)

## 2013-09-10 LAB — CBC
HCT: 43.1 % (ref 36.0–46.0)
Hemoglobin: 14.3 g/dL (ref 12.0–15.0)
MCHC: 33.2 g/dL (ref 30.0–36.0)
Platelets: 332 10*3/uL (ref 150–400)
RDW: 16.8 % — ABNORMAL HIGH (ref 11.5–15.5)
WBC: 31.5 10*3/uL — ABNORMAL HIGH (ref 4.0–10.5)

## 2013-09-10 LAB — URINE CULTURE
Colony Count: NO GROWTH
Culture: NO GROWTH

## 2013-09-10 LAB — CRYPTOCOCCAL ANTIGEN, CSF: Crypto Ag: NEGATIVE

## 2013-09-10 MED ORDER — INSULIN DETEMIR 100 UNIT/ML ~~LOC~~ SOLN
15.0000 [IU] | Freq: Every day | SUBCUTANEOUS | Status: DC
  Administered 2013-09-10: 15 [IU] via SUBCUTANEOUS
  Filled 2013-09-10 (×4): qty 0.15

## 2013-09-10 MED ORDER — FLEET ENEMA 7-19 GM/118ML RE ENEM
1.0000 | ENEMA | Freq: Once | RECTAL | Status: AC
  Administered 2013-09-10: 1 via RECTAL

## 2013-09-10 MED ORDER — IOHEXOL 350 MG/ML SOLN
50.0000 mL | Freq: Once | INTRAVENOUS | Status: AC | PRN
  Administered 2013-09-10: 12 mL

## 2013-09-10 MED ORDER — ACYCLOVIR SODIUM 50 MG/ML IV SOLN
INTRAVENOUS | Status: AC
  Filled 2013-09-10: qty 20

## 2013-09-10 MED ORDER — DEXTROSE 5 % IV SOLN
10.0000 mg/kg | Freq: Three times a day (TID) | INTRAVENOUS | Status: DC
  Administered 2013-09-10: 570 mg via INTRAVENOUS
  Filled 2013-09-10 (×10): qty 11.4

## 2013-09-10 MED ORDER — HYDROMORPHONE HCL PF 1 MG/ML IJ SOLN
0.5000 mg | INTRAMUSCULAR | Status: DC | PRN
  Administered 2013-09-10 – 2013-09-11 (×3): 0.5 mg via INTRAVENOUS
  Filled 2013-09-10 (×3): qty 1

## 2013-09-10 MED ORDER — LORAZEPAM 2 MG/ML IJ SOLN
0.5000 mg | Freq: Four times a day (QID) | INTRAMUSCULAR | Status: DC | PRN
  Administered 2013-09-10 – 2013-09-11 (×2): 0.5 mg via INTRAVENOUS
  Filled 2013-09-10 (×3): qty 1

## 2013-09-10 NOTE — Progress Notes (Signed)
Inpatient Diabetes Program Recommendations  AACE/ADA: New Consensus Statement on Inpatient Glycemic Control  Target Ranges:  Prepandial:   less than 140 mg/dL      Peak postprandial:   less than 180 mg/dL (1-2 hours)      Critically ill patients:  140 - 180 mg/dL  Pager:  [phone] Hours:  8 am-10pm   Reason for Visit: Elevated fasting glucose:  237 mg/dL  Inpatient Diabetes Program Recommendations Insulin - Basal: Increase Levemir to 15 units daily  Alfredia Client PhD, RN, BC-ADM Diabetes Coordinator  Office:  [phone] Team Pager:  [phone]

## 2013-09-10 NOTE — Progress Notes (Signed)
Subjective: Patient in bed, stuporous.  Long discussion with family members present, which is husband and daughter-in-law.  Preliminary results from Dr. Dierdre Searles (Pathologist) reports lobular malignant cells in hematology smear.  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  As a result this is a terminal condition and we suspect she will live only weeks due to her present performance status.  We do not recommend intrathecal chemotherapy or radiation due to her present status.   Attending healthcare providers has held all medications that may be contributing to her present lack of alertness and therefore these are not contributing.   Her altered mental status is secondary to her disease and natural course of disease.   Her husband has signed paperwork to make the patient DNR according to her living will.  We commended him on making this difficult decision and respecting Dannika's wishes.   We recommend Hospice Home.  Order has been placed.   Objective: Vital signs in last 24 hours: Temp:  [98.1 F (36.7 C)-99.1 F (37.3 C)] 98.8 F (37.1 C) (10/10 1410) Pulse Rate:  [97-108] 107 (10/10 1410) Resp:  [18-20] 20 (10/10 1410) BP: (136-149)/(82-101) 146/84 mmHg (10/10 1410) SpO2:  [92 %-98 %] 92 % (10/10 1410)  Intake/Output from previous day: 10/09 0800 - 10/10 0759 In: -  Out: 1000 [Urine:1000] Intake/Output this shift:    General appearance: stuporous  Lab Results:   Recent Labs  09/09/13 0507 09/10/13 0511  WBC 33.2* 31.5*  HGB 13.5 14.3  HCT 41.7 43.1  PLT 351 332   BMET  Recent Labs  09/09/13 0507 09/10/13 0511  NA 128* 132*  K 4.0 3.1*  CL 92* 96  CO2 20 23  GLUCOSE 283* 237*  BUN 19 18  CREATININE 0.49* 0.46*  CALCIUM 9.3 9.5    Studies/Results: [name] Contrast  09/10/2013   CLINICAL DATA:  Breast cancer with brain Mets. Progressive lethargy with unresponsiveness. Evaluate for possible encephalitis.  EXAM:  MRI HEAD WITHOUT AND WITH CONTRAST  TECHNIQUE: Multiplanar, multiecho pulse sequences of the brain and surrounding structures were obtained according to standard protocol without and with intravenous contrast  CONTRAST:  20mL MULTIHANCE GADOBENATE DIMEGLUMINE 529 MG/ML IV SOLN  COMPARISON:  MR head 09/01/2013. CT head 09-14-13.  FINDINGS: The patient was unable to remain motionless for the exam. Small or subtle lesions could be overlooked.  Small focus of restricted diffusion left parieto-occipital parasagittal cortex (image 41 series 2) not present previously, consistent with a small area of acute infarction.  No significant change in size of the dominant right inferior cerebellar metastatic deposit measuring 12 x 15 mm cross-section. Slight mass effect on the 4th ventricle without obstructive hydrocephalus. Numerous other smaller metastases are poorly seen on today's study as compared with the prior 3 T MRI 1 week earlier. No definite new metastases.  With regard to the question of HSV encephalitis, no temporal lobe edema or hemorrhage is identified. No cortical hyperintensity is seen elsewhere. Unchanged small parasagittal meningioma.  Extensive small vessel type changes in the periventricular greater than subcortical white matter. Premature atrophy with ventriculomegaly, not felt to represent communicating hydrocephalus. No meningeal enhancement. Chronic osseous metastatic disease with sclerosis is stable.  IMPRESSION: Within limits of visualization on 1.5 T MR in this motion degraded exam, no new metastatic disease is evident.  New subcentimeter area of acute restricted diffusion in the left parieto-occipital parasagittal cortex consistent with acute infarction.  No evidence for carcinomatous meningitis or encephalitis.  Electronically Signed   By: Davonna Belling M.D.   On: 09/10/2013 08:02   Dg Cv Line Injection  09/10/2013   CLINICAL DATA:  Port-A-Cath, unable to obtain blood return, resistance to  injection  EXAM: CONTRAST INJECTION OF [name] UNDER FLUOROSCOPY  TECHNIQUE: Contrast was administered via the indwelling port after it was accessed. Fluoroscopic spot images were obtained of the catheter during injection.  CONTRAST:  12 mL of Omnipaque 350  FLUOROSCOPY TIME:  0 min 30 seconds  FINDINGS: Patient arrived with a left subclavian Port-A-Cath already accessed.  Needle is within the reservoir. Unable to obtain blood return. With contrast injection, opacification of erosive or seen without extravasation. Contiguous tubing with reservoir. The tubing is patent to the tip. Resistance to injection is present. At the tip of the Port-A-Cath catheter, contrast exits the catheter and tracks proximally along the catheter for a short distance before entering the bloodstream, most likely representing a fibrin sheath.  IMPRESSION: Probable fibrin sheath at tip of Port-A-Cath, preventing blood return and obstructing injection, though a small amount of injectate does enter the bloodstream.   Electronically Signed   By: Ulyses Southward M.D.   On: 09/10/2013 15:21   Dg Fluoro Guide Lumbar Puncture  09/09/2013   CLINICAL DATA:  Metastatic breast cancer to the brain, abnormal MRI with questionable meningeal enhancement, assessment for leptomeningeal carcinomatosis  EXAM: DIAGNOSTIC LUMBAR PUNCTURE UNDER FLUOROSCOPIC GUIDANCE  FLUOROSCOPY TIME:  0 minutes 6 seconds  PROCEDURE: Informed consent was obtained from the patient's husband prior to the procedure, including potential complications of headache, allergy, and pain. With the patient prone, the lower back was prepped with Betadine. 1% Lidocaine was used for local anesthesia. Lumbar puncture was performed at the L4-L5 level using a gauge needle with return of clear colorless CSF with an opening pressure of 36 cm+ water (prone). 10 ml of CSF was obtained for laboratory studies. The patient tolerated the procedure well and there were no apparent complications.   IMPRESSION: Fluoroscopic guided lumbar puncture as above.   Electronically Signed   By: Ulyses Southward M.D.   On: 09/09/2013 17:23    Medications: I have reviewed the patient's current medications.  Assessment/Plan: 1. 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 Recently discovered single brain metastasis in the inferior right cerebellum with possible other foci that are extremely small without edema. S/P LP yesterday.  Preliminary results per Dr. Dierdre Searles (Pathologist), hematology smear is positive for lobular malignancy cells, and therefore indicative of meningeal carcinomatosis.  Terminal prognosis. 2. Recommend Hospice Home 3. Electrolyte abnormalities corrected 4. Sedative medication held without improvement in alertness. 5. DNR 6. Hospice Home referral placed  Patient and plan discussed with Dr. Alla German and he is in agreement with the aforementioned.   Dr. Zigmund Daniel guided family discussion today.   LOS: 2 days    KEFALAS,THOMAS 09/10/2013

## 2013-09-10 NOTE — Progress Notes (Signed)
[facility] PROGRESS NOTE  Kristen Parker WJX:[id number] DOB: 25-Feb-1951 DOA: 09/16/2013 PCP: [name], MD  Assessment/Plan: Acute encephalopathy: Little improvement. Infectious workup remains negative. Blood cultures with no growth. ABG CO2 33.4 and O2 72.3, ammonia level within normal limits. LP results discussed with ID who opined results did not reflect a bacterial meningitis and therefore antibacterials not recommended. Acyclovir started pending HSV pcr result. Repeat MRI yields no significant change in brain met and new subcentimeter area of acute restricted diffusion in the left parieto-occipital parasagittal cortex consistent with acute  Infarction. Significance unclear. Will defer to neurology.  Appreciate oncology assistance. Await EEG results. Continue Dilantin. Discontinue narcotics and benzos.  Improved sodium and glucose levels. Urine sodium 133, urine osmolality 760, serum osmolality 284.  Will increase levemir and continue SSI. Continue IV fluids at 125/hr.  Active Problems:  Leukocytosis: trending down today. Likely related to Decadron. Urinalysis was unremarkable.chest x-ray without acute process. Afebrile. Will continue to monitor closely. Blood cultures pending. See #1   Tachycardia: mild.  Likely related to dehydration in setting of hyperglycemia. Continue vigorous IV fluids. Volume status -2.3. Concern for SIADh   Hyponatremia: Improving. Continue IV fluids. serum osmolality 284 and urine sodium 133 and osmolality 760.   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 apprecieate oncology consult. Patient had started radiation 5 days ago. Appreciate oncology assistance. Prognosis poor even with treatment. Family verbalizing interest in Hospice.    Diabetes: Patient was hyperglycemic on admission. Of note she has been on Decadron for several weeks. Patient is n.p.o. Hemoglobin A1c 8.0. CBG range 230-280. Will increase basal. Monitor   Antineoplastic  chemotherapy induced anemia(285.3) chart review indicates currently stable. Will monitor.   Hypertension. Fair control. Patient on lisinopril at home. Will hold this now do to encephalopathy. Will monitor blood pressure. Will provide hydralazine when necessary.   Graves' disease. TSH 2.1.     Code Status: full Family Communication: son at bedside Disposition Plan: depends on clinical progression   Consultants:  Oncology   neurology  Procedures:  LP 09/09/13  Antibiotics:  Acylovir 09/09/13>>>  HPI/Subjective: Obtunded. Received dilaudid earlier this am.   Objective: Filed Vitals:   09/10/13 0536  BP: 136/101  Pulse:   Temp:   Resp:     Intake/Output Summary (Last 24 hours) at 09/10/13 0909 Last data filed at 09/09/13 1542  Gross per 24 hour  Intake      0 ml  Output   1000 ml  Net  -1000 ml   Filed Weights   09/07/2013 1330  Weight: 93.895 kg (207 lb)    Exam:   General:  Obese obtunded. NAD  Cardiovascular: RRR No MGR No LE edema  Respiratory: normal effort BS coarse bilaterally no wheeze  Abdomen: obese soft +BS non-tender to palpation  Musculoskeletal: no cyanosis no clubbing    Data Reviewed: Basic Metabolic Panel:  Recent Labs Lab 09/03/13 1651 09/07/2013 0656 09/09/13 0507 09/10/13 0511  NA  --  129* 128* 132*  K  --  4.3 4.0 3.1*  CL  --  93* 92* 96  CO2  --  23 20 23   GLUCOSE  --  332* 283* 237*  BUN 29* 29* 19 18  CREATININE 0.83 0.64 0.49* 0.46*  CALCIUM  --  9.6 9.3 9.5   Liver Function Tests:  Recent Labs Lab 09/27/2013 0656 09/09/13 0507  AST 19 18  ALT 13 12  ALKPHOS 118* 116  BILITOT 0.4 0.4  PROT 6.8 6.8  ALBUMIN 3.2* 3.3*   No results found for this basename: LIPASE, AMYLASE,  in the last 168 hours  Recent Labs Lab 09/09/13 1739  AMMONIA 22   CBC:  Recent Labs Lab 2013-09-14 0656 09/09/13 0507 09/10/13 0511  WBC 26.5* 33.2* 31.5*  NEUTROABS 24.8*  --   --   HGB 13.0 13.5 14.3  HCT 40.0 41.7 43.1   MCV 96.6 96.8 97.1  PLT 322 351 332   Cardiac Enzymes: No results found for this basename: CKTOTAL, CKMB, CKMBINDEX, TROPONINI,  in the last 168 hours BNP (last 3 results)  Recent Labs  06/15/13 0940  PROBNP 63.5   CBG:  Recent Labs Lab 09/09/13 0726 09/09/13 1111 09/09/13 1609 09/09/13 2230 09/10/13 0748  GLUCAP 277* 235* 223* 229* 261*    Recent Results (from the past 240 hour(s))  URINE CULTURE     Status: None   Collection Time    08/31/13  3:51 PM      Result Value Range Status   Specimen Description URINE, CLEAN CATCH   Final   Special Requests NONE   Final   Culture  Setup Time     Final   Value: 08/31/2013 17:30     Two isolates with different morphologies were identified as the same organism.The most resistant organism was reported.     Performed at [facility] Count     Final   Value: >=100,000 COLONIES/ML     Performed at [facility]   Culture     Final   Value: KLEBSIELLA PNEUMONIAE     Performed at [facility]   Report Status 09/04/2013 FINAL   Final   Organism ID, Bacteria KLEBSIELLA PNEUMONIAE   Final  URINE CULTURE     Status: None   Collection Time    2013-09-14  8:29 AM      Result Value Range Status   Specimen Description URINE, CATHETERIZED   Final   Special Requests NONE   Final   Culture  Setup Time     Final   Value: 09/09/2013 05:29     Performed at [facility] Count     Final   Value: NO GROWTH     Performed at [facility]   Culture     Final   Value: NO GROWTH     Performed at [facility]   Report Status 09/10/2013 FINAL   Final  CULTURE, BLOOD (ROUTINE X 2)     Status: None   Collection Time    September 14, 2013  5:21 PM      Result Value Range Status   Specimen Description BLOOD LEFT ANTECUBITAL   Final   Special Requests BOTTLES DRAWN AEROBIC ONLY 6CC   Final   Culture NO GROWTH 1 DAY   Final   Report Status PENDING   Incomplete  CULTURE, BLOOD (ROUTINE X 2)      Status: None   Collection Time    09/14/13  5:21 PM      Result Value Range Status   Specimen Description BLOOD LEFT ANTECUBITAL   Final   Special Requests BOTTLES DRAWN AEROBIC ONLY 4CC   Final   Culture NO GROWTH 1 DAY   Final   Report Status PENDING   Incomplete  CSF CULTURE     Status: None   Collection Time    09/09/13  3:16 PM      Result Value Range Status  Specimen Description CSF   Final   Special Requests NONE   Final   Gram Stain     Final   Value: WBC PRESENT,BOTH PMN AND MONONUCLEAR     NO ORGANISMS SEEN     Performed at [facility]   Culture PENDING   Incomplete   Report Status PENDING   Incomplete     Studies: [name] Contrast  28-Sep-2013   CLINICAL DATA:  Breast cancer with brain Mets. Progressive lethargy with unresponsiveness. Evaluate for possible encephalitis.  EXAM: MRI HEAD WITHOUT AND WITH CONTRAST  TECHNIQUE: Multiplanar, multiecho pulse sequences of the brain and surrounding structures were obtained according to standard protocol without and with intravenous contrast  CONTRAST:  20mL MULTIHANCE GADOBENATE DIMEGLUMINE 529 MG/ML IV SOLN  COMPARISON:  MR head 09/01/2013. CT head Sep 26, 2013.  FINDINGS: The patient was unable to remain motionless for the exam. Small or subtle lesions could be overlooked.  Small focus of restricted diffusion left parieto-occipital parasagittal cortex (image 41 series 2) not present previously, consistent with a small area of acute infarction.  No significant change in size of the dominant right inferior cerebellar metastatic deposit measuring 12 x 15 mm cross-section. Slight mass effect on the 4th ventricle without obstructive hydrocephalus. Numerous other smaller metastases are poorly seen on today's study as compared with the prior 3 T MRI 1 week earlier. No definite new metastases.  With regard to the question of HSV encephalitis, no temporal lobe edema or hemorrhage is identified. No cortical hyperintensity is seen  elsewhere. Unchanged small parasagittal meningioma.  Extensive small vessel type changes in the periventricular greater than subcortical white matter. Premature atrophy with ventriculomegaly, not felt to represent communicating hydrocephalus. No meningeal enhancement. Chronic osseous metastatic disease with sclerosis is stable.  IMPRESSION: Within limits of visualization on 1.5 T MR in this motion degraded exam, no new metastatic disease is evident.  New subcentimeter area of acute restricted diffusion in the left parieto-occipital parasagittal cortex consistent with acute infarction.  No evidence for carcinomatous meningitis or encephalitis.   Electronically Signed   By: Davonna Belling M.D.   On: Sep 28, 2013 08:02   Dg Fluoro Guide Lumbar Puncture  09/09/2013   CLINICAL DATA:  Metastatic breast cancer to the brain, abnormal MRI with questionable meningeal enhancement, assessment for leptomeningeal carcinomatosis  EXAM: DIAGNOSTIC LUMBAR PUNCTURE UNDER FLUOROSCOPIC GUIDANCE  FLUOROSCOPY TIME:  0 minutes 6 seconds  PROCEDURE: Informed consent was obtained from the patient's husband prior to the procedure, including potential complications of headache, allergy, and pain. With the patient prone, the lower back was prepped with Betadine. 1% Lidocaine was used for local anesthesia. Lumbar puncture was performed at the L4-L5 level using a gauge needle with return of clear colorless CSF with an opening pressure of 36 cm+ water (prone). 10 ml of CSF was obtained for laboratory studies. The patient tolerated the procedure well and there were no apparent complications.  IMPRESSION: Fluoroscopic guided lumbar puncture as above.   Electronically Signed   By: Ulyses Southward M.D.   On: 09/09/2013 17:23    Scheduled Meds: . acyclovir  10 mg/kg (Ideal) Intravenous Q8H  . clotrimazole  1 Applicatorful Vaginal QHS  . dexamethasone  4 mg Intravenous Q12H  . enoxaparin (LOVENOX) injection  40 mg Subcutaneous Q24H  . insulin aspart   0-20 Units Subcutaneous TID WC  . insulin aspart  0-5 Units Subcutaneous QHS  . insulin detemir  15 Units Subcutaneous Daily  . phenytoin (DILANTIN) IV  100 mg Intravenous Q8H  . sodium chloride  3 mL Intravenous Q12H   Continuous Infusions: . 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 If 7PM-7AM, please contact night-coverage at [email or web address], password [facility] 09/10/2013, 9:09 AM  LOS: 2 days

## 2013-09-10 NOTE — Progress Notes (Signed)
Patient given fleet enema without immediate results.  Unable to hold. Also noticed that patients pad was damp, foley appears to be leaking and has not had much out put in bag.  Bladder scan revealed about 150.  14 fr foley replaced with good urine return and output.  Will continue to monitor.

## 2013-09-10 NOTE — Progress Notes (Signed)
Patient seen and examined.  Above note reveiwed.  Patient remains unresponsive.  Narcotics and benzos have been discontinued. Husband and children in the room at the time of my visit.  CSF analysis from yesterday has been unrevealing.  It is unlikely that she has a bacterial meningitis. She was empirically started on acylcovir until HSV PCR returns, but likelihood of this is low. Csf for cytology is still pending and may shed some light if she has leptomeningeal involvement of her cancer, which per oncology, could cause her current presentation.  MRI brain done today did not show any change in metastatic disease, but did indicate a possible new stroke.  This is a small infarct and unclear if this is truly playing a significant role in her current presentation.  EEG has been done with report pending and she is continued on dilantin and steroids.  Defer to neurology.  Other metabolic work up has been unremarkable.  Hyponatremia/hyperglycemia is improved, dehydration has improved, abg and ammonia also unremarkable.  Overall prognosis appears poor.  Discussed with patient's husband who informed that that patient would want to be DNR.  He also said that she would be interested in hospice if needed.  Will follow up with oncology/neurology regarding further recommendations.  Kristen Parker

## 2013-09-10 NOTE — Progress Notes (Signed)
Patients foley left in d/t patients plan of care being unknown at this time.  Depending on test results, patient may become comfort care and referred to hospice.  Spoke with MD of this, OK to leave foley until we know for sure what patients plan is.

## 2013-09-11 DIAGNOSIS — C7949 Secondary malignant neoplasm of other parts of nervous system: Secondary | ICD-10-CM

## 2013-09-11 LAB — GLUCOSE, CAPILLARY: Glucose-Capillary: 222 mg/dL — ABNORMAL HIGH (ref 70–99)

## 2013-09-11 MED ORDER — HYDROMORPHONE HCL PF 1 MG/ML IJ SOLN
1.0000 mg | INTRAMUSCULAR | Status: DC | PRN
  Administered 2013-09-11 – 2013-09-12 (×4): 1 mg via INTRAVENOUS
  Filled 2013-09-11 (×4): qty 1

## 2013-09-11 MED ORDER — LORAZEPAM 2 MG/ML IJ SOLN
1.0000 mg | Freq: Four times a day (QID) | INTRAMUSCULAR | Status: DC | PRN
  Administered 2013-09-11 – 2013-09-12 (×2): 1 mg via INTRAVENOUS
  Filled 2013-09-11 (×2): qty 1

## 2013-09-11 MED ORDER — ALTEPLASE 2 MG IJ SOLR
2.0000 mg | Freq: Once | INTRAMUSCULAR | Status: AC
  Administered 2013-09-11: 2 mg
  Filled 2013-09-11: qty 2

## 2013-09-11 NOTE — Progress Notes (Signed)
[facility] PROGRESS NOTE  Kristen Parker RUE:[id number] DOB: 02/24/1951 DOA: October 04, 2013 PCP: [name], MD  Assessment/Plan: 1. 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 Patient remains unresponsive. Prognosis is terminal. After discussion with oncology, family has elected to pursue hospice care in residential hospice. This appears appropriate. We have simplified her medication regimen to include medications strictly related to her comfort. We'll discontinue medications such as Dilantin, Decadron, insulin. Focus on medications such as narcotics or pain, benzos for agitation. Continue supportive treatments.  Code Status: DNR, comfort measures only Family Communication: discussed with children at the bedside Disposition Plan: discharge to residential hospice when bed is available   Consultants:  Oncology  Neurology  Procedures:  Lumbar puncture 10/9  Antibiotics:  none  HPI/Subjective: unresponsive  Objective: Filed Vitals:   09/11/13 0545  BP: 139/93  Pulse: 115  Temp: 98.2 F (36.8 C)  Resp: 20    Intake/Output Summary (Last 24 hours) at 09/11/13 1214 Last data filed at 09/10/13 1900  Gross per 24 hour  Intake      0 ml  Output    850 ml  Net   -850 ml   Filed Weights   10/04/2013 1330  Weight: 93.895 kg (207 lb)    Exam:   General:  Unresponsive, appears comfortable  Cardiovascular: s1, s2, tachycardic  Respiratory: cta b    Data Reviewed: Basic Metabolic Panel:  Recent Labs Lab October 04, 2013 0656 09/09/13 0507 09/10/13 0511  NA 129* 128* 132*  K 4.3 4.0 3.1*  CL 93* 92* 96  CO2 23 20 23   GLUCOSE 332* 283* 237*  BUN 29* 19 18  CREATININE 0.64 0.49* 0.46*  CALCIUM 9.6 9.3 9.5   Liver Function Tests:  Recent Labs Lab 10-04-13 0656 09/09/13 0507  AST 19 18  ALT 13 12  ALKPHOS 118* 116  BILITOT 0.4 0.4  PROT 6.8 6.8  ALBUMIN 3.2* 3.3*   No results found for this basename: LIPASE, AMYLASE,  in the  last 168 hours  Recent Labs Lab 09/09/13 1739  AMMONIA 22   CBC:  Recent Labs Lab 10-04-13 0656 09/09/13 0507 09/10/13 0511  WBC 26.5* 33.2* 31.5*  NEUTROABS 24.8*  --   --   HGB 13.0 13.5 14.3  HCT 40.0 41.7 43.1  MCV 96.6 96.8 97.1  PLT 322 351 332   Cardiac Enzymes: No results found for this basename: CKTOTAL, CKMB, CKMBINDEX, TROPONINI,  in the last 168 hours BNP (last 3 results)  Recent Labs  06/15/13 0940  PROBNP 63.5   CBG:  Recent Labs Lab 09/10/13 0748 09/10/13 1148 09/10/13 1640 09/10/13 2123 09/11/13 0755  GLUCAP 261* 208* 208* 196* 222*    Recent Results (from the past 240 hour(s))  URINE CULTURE     Status: None   Collection Time    10/04/2013  8:29 AM      Result Value Range Status   Specimen Description URINE, CATHETERIZED   Final   Special Requests NONE   Final   Culture  Setup Time     Final   Value: 09/09/2013 05:29     Performed at [facility] Count     Final   Value: NO GROWTH     Performed at [facility]   Culture     Final   Value: NO GROWTH     Performed at [facility]   Report Status 09/10/2013 FINAL   Final  CULTURE, BLOOD (ROUTINE X 2)  Status: None   Collection Time    09/11/2013  5:21 PM      Result Value Range Status   Specimen Description BLOOD LEFT ANTECUBITAL   Final   Special Requests BOTTLES DRAWN AEROBIC ONLY 6CC   Final   Culture NO GROWTH 3 DAYS   Final   Report Status PENDING   Incomplete  CULTURE, BLOOD (ROUTINE X 2)     Status: None   Collection Time    09/02/2013  5:21 PM      Result Value Range Status   Specimen Description BLOOD LEFT ANTECUBITAL   Final   Special Requests BOTTLES DRAWN AEROBIC ONLY 4CC   Final   Culture NO GROWTH 3 DAYS   Final   Report Status PENDING   Incomplete  CSF CULTURE     Status: None   Collection Time    09/09/13  3:16 PM      Result Value Range Status   Specimen Description CSF   Final   Special Requests NONE   Final   Gram Stain      Final   Value: WBC PRESENT,BOTH PMN AND MONONUCLEAR     NO ORGANISMS SEEN     Performed at [facility]   Culture     Final   Value: NO GROWTH 1 DAY     Performed at [facility]   Report Status PENDING   Incomplete     Studies: [name] Contrast  09-30-13   CLINICAL DATA:  Breast cancer with brain Mets. Progressive lethargy with unresponsiveness. Evaluate for possible encephalitis.  EXAM: MRI HEAD WITHOUT AND WITH CONTRAST  TECHNIQUE: Multiplanar, multiecho pulse sequences of the brain and surrounding structures were obtained according to standard protocol without and with intravenous contrast  CONTRAST:  20mL MULTIHANCE GADOBENATE DIMEGLUMINE 529 MG/ML IV SOLN  COMPARISON:  MR head 09/01/2013. CT head 09/29/2013.  FINDINGS: The patient was unable to remain motionless for the exam. Small or subtle lesions could be overlooked.  Small focus of restricted diffusion left parieto-occipital parasagittal cortex (image 41 series 2) not present previously, consistent with a small area of acute infarction.  No significant change in size of the dominant right inferior cerebellar metastatic deposit measuring 12 x 15 mm cross-section. Slight mass effect on the 4th ventricle without obstructive hydrocephalus. Numerous other smaller metastases are poorly seen on today's study as compared with the prior 3 T MRI 1 week earlier. No definite new metastases.  With regard to the question of HSV encephalitis, no temporal lobe edema or hemorrhage is identified. No cortical hyperintensity is seen elsewhere. Unchanged small parasagittal meningioma.  Extensive small vessel type changes in the periventricular greater than subcortical white matter. Premature atrophy with ventriculomegaly, not felt to represent communicating hydrocephalus. No meningeal enhancement. Chronic osseous metastatic disease with sclerosis is stable.  IMPRESSION: Within limits of visualization on 1.5 T MR in this motion degraded  exam, no new metastatic disease is evident.  New subcentimeter area of acute restricted diffusion in the left parieto-occipital parasagittal cortex consistent with acute infarction.  No evidence for carcinomatous meningitis or encephalitis.   Electronically Signed   By: Davonna Belling M.D.   On: September 30, 2013 08:02   Dg Cv Line Injection  09-30-13   CLINICAL DATA:  Port-A-Cath, unable to obtain blood return, resistance to injection  EXAM: CONTRAST INJECTION OF [name] UNDER FLUOROSCOPY  TECHNIQUE: Contrast was administered via the indwelling port after it was accessed. Fluoroscopic spot images were  obtained of the catheter during injection.  CONTRAST:  12 mL of Omnipaque 350  FLUOROSCOPY TIME:  0 min 30 seconds  FINDINGS: Patient arrived with a left subclavian Port-A-Cath already accessed.  Needle is within the reservoir. Unable to obtain blood return. With contrast injection, opacification of erosive or seen without extravasation. Contiguous tubing with reservoir. The tubing is patent to the tip. Resistance to injection is present. At the tip of the Port-A-Cath catheter, contrast exits the catheter and tracks proximally along the catheter for a short distance before entering the bloodstream, most likely representing a fibrin sheath.  IMPRESSION: Probable fibrin sheath at tip of Port-A-Cath, preventing blood return and obstructing injection, though a small amount of injectate does enter the bloodstream.   Electronically Signed   By: Ulyses Southward M.D.   On: 09/10/2013 15:21   Dg Fluoro Guide Lumbar Puncture  09/09/2013   CLINICAL DATA:  Metastatic breast cancer to the brain, abnormal MRI with questionable meningeal enhancement, assessment for leptomeningeal carcinomatosis  EXAM: DIAGNOSTIC LUMBAR PUNCTURE UNDER FLUOROSCOPIC GUIDANCE  FLUOROSCOPY TIME:  0 minutes 6 seconds  PROCEDURE: Informed consent was obtained from the patient's husband prior to the procedure, including potential complications of headache,  allergy, and pain. With the patient prone, the lower back was prepped with Betadine. 1% Lidocaine was used for local anesthesia. Lumbar puncture was performed at the L4-L5 level using a gauge needle with return of clear colorless CSF with an opening pressure of 36 cm+ water (prone). 10 ml of CSF was obtained for laboratory studies. The patient tolerated the procedure well and there were no apparent complications.  IMPRESSION: Fluoroscopic guided lumbar puncture as above.   Electronically Signed   By: Ulyses Southward M.D.   On: 09/09/2013 17:23    Scheduled Meds: . alteplase  2 mg Intracatheter Once  . clotrimazole  1 Applicatorful Vaginal QHS  . sodium chloride  3 mL Intravenous Q12H   Continuous Infusions: . sodium chloride 50 mL/hr at 09/10/13 1744    Principal Problem:   Acute encephalopathy Active Problems:   Antineoplastic chemotherapy induced anemia(285.3)   Metastasis to brain   Diabetes   Leukocytosis   Tachycardia   Hyponatremia   SIADH (syndrome of inappropriate ADH production)   Meningeal carcinomatosis    Time spent:    [facility]  [facility] Pager [phone]. If 7PM-7AM, please contact night-coverage at [email or web address], password [facility] 09/11/2013, 12:14 PM  LOS: 3 days

## 2013-09-11 NOTE — Progress Notes (Signed)
Upon assessment, pt had a moderate amount of vaginal bleeding. MD Rito Ehrlich was notified and vaginal medication for yeast infection was directed to still give. Pt stable and safe. Will continue to monitor.

## 2013-09-11 NOTE — Progress Notes (Signed)
Nutrition Brief Note  Chart reviewed. Pt now transitioning to comfort care.  No further nutrition interventions warranted at this time.  Please re-consult as needed.   Idonna Heeren A. Demarquis Osley, RD, LDN Pager: [phone]  

## 2013-09-12 DIAGNOSIS — I639 Cerebral infarction, unspecified: Secondary | ICD-10-CM | POA: Diagnosis present

## 2013-09-12 DIAGNOSIS — E876 Hypokalemia: Secondary | ICD-10-CM | POA: Diagnosis not present

## 2013-09-12 LAB — CSF CULTURE W GRAM STAIN: Culture: NO GROWTH

## 2013-09-12 MED ORDER — ATROPINE SULFATE 1 % OP SOLN
2.0000 [drp] | Freq: Four times a day (QID) | OPHTHALMIC | Status: DC
  Administered 2013-09-12: 2 [drp] via SUBLINGUAL
  Filled 2013-09-12: qty 2

## 2013-09-13 LAB — CULTURE, BLOOD (ROUTINE X 2): Culture: NO GROWTH

## 2013-09-13 NOTE — Procedures (Signed)
[facility] Andrez Lieurance A. Gerilyn Pilgrim, MD     [email or web address]         NAMESALIHAH, PECKHAM               ACCOUNT NO.:  [id number]  MEDICAL RECORD NO.:  [id number]  LOCATION:                                 FACILITY:  PHYSICIAN:  Ahmon Tosi A. Gerilyn Pilgrim, M.D. DATE OF BIRTH:  [birth year]  DATE OF PROCEDURE:  09/13/2013 DATE OF DISCHARGE:  09/23/2013                             EEG INTERPRETATION   INDICATION:  This is a 62 year old who presents with altered mental status and confusion.  This picture is worrisome for possible seizures.  MEDICATIONS:  None listed.  ANALYSIS:  A 16-channel recording using standard 10/20 measurement is conducted for approximately 20 minutes.  There is a posterior dominant rhythm that gets as high as 7.5 to 8 Hz.  Although in most of recording, there is theta activity seen.  Beta activity observed in the frontal areas.  There is no focal or lateralized slowing.  There is no epileptiform activity.  Hyperventilation and photic stimulation were not carried out.  IMPRESSION:  Mild generalized slowing, otherwise unremarkable.     Wyatt Thorstenson A. Gerilyn Pilgrim, M.D.     KAD/MEDQ  D:  09/13/2013  T:  09/13/2013  Job:  [id number]

## 2013-09-14 ENCOUNTER — Ambulatory Visit (HOSPITAL_COMMUNITY): Payer: Medicare Other

## 2013-09-17 ENCOUNTER — Encounter: Payer: Self-pay | Admitting: Radiation Oncology

## 2013-09-28 ENCOUNTER — Ambulatory Visit (HOSPITAL_COMMUNITY): Payer: Medicare Other

## 2013-10-02 NOTE — Discharge Summary (Signed)
Death Summary  Kristen Parker YNW:[id number] DOB: 1951-06-18 DOA: 09-17-2013  PCP: [name], MD  Admit date: 09-17-2013 Date of Death: 2013-09-21  Final Diagnoses:  Principal Problem:   Meningeal carcinomatosis Active Problems:   Breast CA   Antineoplastic chemotherapy induced anemia(285.3)   Metastasis to brain   Diabetes   Acute encephalopathy   Leukocytosis   Tachycardia   Hyponatremia   SIADH (syndrome of inappropriate ADH production)   Acute CVA (cerebrovascular accident)   Hypokalemia   History of present illness:  Kristen Parker is a 62 y.o. female with a past medical history that includes diabetes, metastatic breast cancer recent diagnosis of right cerebellum brain metastases, recent UTI, Graves' disease presents to the emergency department today via EMS with a chief complaint of altered metal status. Information is obtained from the family who is at the bedside. Patient reports that approximately 7 days ago she developed progressively worsening weakness. She received radiation therapy 5 days ago and since then has deteriorated fairly quickly. Family reports that she has been confused uncooperative. Associated symptoms include anorexia, falling, incontinence of urine. There's been no report of vomiting. Family does indicate that the patient's nonverbal language seems to indicate a headache and generalized pain specifically patient will put her hands over her head and contract her legs and point her toes while in bed. Patient was scheduled for radiation yesterday and was unable to sit still for that treatment. She was given Ativan at that time and remained agitated. She was also treated 8 days ago for urinary tract infection with Cipro. Lab work in the emergency room yields a serum glucose of 307, sodium level of 129 alkaline phosphatase date 118 and a white count of 26.5 noting that patient has been on Decadron for several weeks. Urinalysis yields greater than 1000 glucose  otherwise unremarkable. Chest x-ray yields no acute cardiopulmonary process CT of the head yields atrophy with small vessel chronic ischemic changes of deep cerebral white matter. Small falcine meningioma. Extensive osseous metastatic disease. No definite acute intracranial abnormalities. Vital signs are significant for heart rate of 112. Symptoms came on gradually have persisted and worsened characterized as moderate to severe   Hospital Course:  The patient was admitted to the hospital with worsening lethargy, generalized weakness. She is becoming increasingly agitated. Her mental status continued to decline and she subsequently became unresponsive. Workup was extensive. Initially it was felt that her metabolic abnormalities weren't causing her alteration in mental status. Unfortunately after these were corrected, she did not have any significant improvement. She was seen by both oncology and neurology. It was felt that her current brain metastases were not playing a role in her current presentation. She was started on Dilantin and EEG was performed. Lumbar puncture did not indicate any signs of bacterial infection. She was empirically started on acyclovir for possible viral encephalitis. Repeat MRI of the brain was performed which did not show any progression or metastatic disease, but did show a small acute infarct. It was unclear if this was playing a significant role in her presentation. Cytology from lumbar puncture returned positive for malignant cells. It was felt that patient had meningeal carcinomatosis which had lead to her current presentation. With this diagnosis, her prognosis was extremely poor. After discussion with the family, it was elected to pursue hospice/comfort care. Since patient was felt to be terminal, further workup of acute stroke was not pursued. Patient was monitored in the hospital and was awaiting transfer to residential hospice facility. On  09/16/2013 at 16:40, she was found to be  without respirations or pulse. She was pronounced dead at that time. Family was present.   Time:  Signed:  [name],[name]  [facility] Sep 16, 2013, 7:41 PM

## 2013-10-02 NOTE — Progress Notes (Signed)
Called into patient's room by family.  Patient without  pulse, respirations or blood pressure, assessed by 2 RNs. MD, supervisor and [employer] [facility] called and informed. Family at bedside.

## 2013-10-02 NOTE — Progress Notes (Signed)
[facility] PROGRESS NOTE  Kristen Parker WUJ:[id number] DOB: 1951-07-10 DOA: 09/14/2013 PCP: [name], MD  Assessment/Plan: 1. Acute encephalopathy felt to be due to meningeal carcinomatosis from breast cancer. Patient remains unresponsive. Prognosis is terminal. After discussion with oncology, family has elected to pursue hospice care in residential hospice. This appears appropriate. Continue supportive treatments. Will follow up with hospice services tomorrow to determine bed availability  Code Status: DNR, comfort measures only Family Communication: discussed with children at the bedside Disposition Plan: discharge to residential hospice when bed is available   Consultants:  Oncology  Neurology  Procedures:  Lumbar puncture 10/9  Antibiotics:  none  HPI/Subjective: unresponsive  Objective: Filed Vitals:   September 20, 2013 0623  BP: 108/69  Pulse: 138  Temp: 98.9 F (37.2 C)  Resp: 28    Intake/Output Summary (Last 24 hours) at 2013-09-20 1231 Last data filed at 09-20-13 0500  Gross per 24 hour  Intake      0 ml  Output   1200 ml  Net  -1200 ml   Filed Weights   09/02/2013 1330  Weight: 93.895 kg (207 lb)    Exam:   General:  Unresponsive, appears comfortable  Cardiovascular: s1, s2, tachycardic  Respiratory: cta b    Data Reviewed: Basic Metabolic Panel:  Recent Labs Lab 09/22/2013 0656 09/09/13 0507 09/10/13 0511  NA 129* 128* 132*  K 4.3 4.0 3.1*  CL 93* 92* 96  CO2 23 20 23   GLUCOSE 332* 283* 237*  BUN 29* 19 18  CREATININE 0.64 0.49* 0.46*  CALCIUM 9.6 9.3 9.5   Liver Function Tests:  Recent Labs Lab 09/05/2013 0656 09/09/13 0507  AST 19 18  ALT 13 12  ALKPHOS 118* 116  BILITOT 0.4 0.4  PROT 6.8 6.8  ALBUMIN 3.2* 3.3*   No results found for this basename: LIPASE, AMYLASE,  in the last 168 hours  Recent Labs Lab 09/09/13 1739  AMMONIA 22   CBC:  Recent Labs Lab 09/23/2013 0656 09/09/13 0507 09/10/13 0511   WBC 26.5* 33.2* 31.5*  NEUTROABS 24.8*  --   --   HGB 13.0 13.5 14.3  HCT 40.0 41.7 43.1  MCV 96.6 96.8 97.1  PLT 322 351 332   Cardiac Enzymes: No results found for this basename: CKTOTAL, CKMB, CKMBINDEX, TROPONINI,  in the last 168 hours BNP (last 3 results)  Recent Labs  06/15/13 0940  PROBNP 63.5   CBG:  Recent Labs Lab 09/10/13 0748 09/10/13 1148 09/10/13 1640 09/10/13 2123 09/11/13 0755  GLUCAP 261* 208* 208* 196* 222*    Recent Results (from the past 240 hour(s))  URINE CULTURE     Status: None   Collection Time    09/06/2013  8:29 AM      Result Value Range Status   Specimen Description URINE, CATHETERIZED   Final   Special Requests NONE   Final   Culture  Setup Time     Final   Value: 09/09/2013 05:29     Performed at [facility] Count     Final   Value: NO GROWTH     Performed at [facility]   Culture     Final   Value: NO GROWTH     Performed at [facility]   Report Status 09/10/2013 FINAL   Final  CULTURE, BLOOD (ROUTINE X 2)     Status: None   Collection Time    09/22/2013  5:21 PM  Result Value Range Status   Specimen Description BLOOD LEFT ANTECUBITAL   Final   Special Requests BOTTLES DRAWN AEROBIC ONLY 6CC   Final   Culture NO GROWTH 4 DAYS   Final   Report Status PENDING   Incomplete  CULTURE, BLOOD (ROUTINE X 2)     Status: None   Collection Time    2013/10/07  5:21 PM      Result Value Range Status   Specimen Description BLOOD LEFT ANTECUBITAL   Final   Special Requests BOTTLES DRAWN AEROBIC ONLY 4CC   Final   Culture NO GROWTH 4 DAYS   Final   Report Status PENDING   Incomplete  CSF CULTURE     Status: None   Collection Time    09/09/13  3:16 PM      Result Value Range Status   Specimen Description CSF   Final   Special Requests NONE   Final   Gram Stain     Final   Value: WBC PRESENT,BOTH PMN AND MONONUCLEAR     NO ORGANISMS SEEN     Performed at [facility]   Culture     Final    Value: NO GROWTH 2 DAYS     Performed at [facility]   Report Status PENDING   Incomplete     Studies: Dg Cv Line Injection  09/10/2013   CLINICAL DATA:  Port-A-Cath, unable to obtain blood return, resistance to injection  EXAM: CONTRAST INJECTION OF [name] UNDER FLUOROSCOPY  TECHNIQUE: Contrast was administered via the indwelling port after it was accessed. Fluoroscopic spot images were obtained of the catheter during injection.  CONTRAST:  12 mL of Omnipaque 350  FLUOROSCOPY TIME:  0 min 30 seconds  FINDINGS: Patient arrived with a left subclavian Port-A-Cath already accessed.  Needle is within the reservoir. Unable to obtain blood return. With contrast injection, opacification of erosive or seen without extravasation. Contiguous tubing with reservoir. The tubing is patent to the tip. Resistance to injection is present. At the tip of the Port-A-Cath catheter, contrast exits the catheter and tracks proximally along the catheter for a short distance before entering the bloodstream, most likely representing a fibrin sheath.  IMPRESSION: Probable fibrin sheath at tip of Port-A-Cath, preventing blood return and obstructing injection, though a small amount of injectate does enter the bloodstream.   Electronically Signed   By: Ulyses Southward M.D.   On: 09/10/2013 15:21    Scheduled Meds: . atropine  2 drop Sublingual QID  . sodium chloride  3 mL Intravenous Q12H   Continuous Infusions:    Principal Problem:   Acute encephalopathy Active Problems:   Antineoplastic chemotherapy induced anemia(285.3)   Metastasis to brain   Diabetes   Leukocytosis   Tachycardia   Hyponatremia   SIADH (syndrome of inappropriate ADH production)   Meningeal carcinomatosis    Time spent:    [facility]  [facility] Pager [phone]. If 7PM-7AM, please contact night-coverage at [email or web address], password [facility] 09/03/2013, 12:31 PM  LOS: 4 days

## 2013-10-02 DEATH — deceased

## 2013-10-21 NOTE — Progress Notes (Signed)
  Radiation Oncology         [phone] ________________________________  Name: Kristen Parker MRN: [id number]  Date: 09/17/2013  DOB: 12-31-1950  End of Treatment Note  Diagnosis:   Metastatic breast cancer     Indication for treatment:  Palliative       Radiation treatment dates:   09/03/2013  Site/dose:    1. PTV1 Rt inf cerebellar 15 mm target was treated using 3 dynamic conformal Arcs to a prescription dose of 20 Gy. ExacTrac Snap verification was performed for each couch angle.  2. PTV2 Lt cerebellar 2 mm target was planned using 2 circular collimator Arcs to a prescription dose of 20 Gy. ExacTrac Snap verification was performed for each couch angle. The patient received a partial treatment to this lesion. She was unable to complete the entirety. The patient was planned to receive stereotactic radiosurgery to a total of 9 lesions. She was unable to complete the entirety of this course of treatment.  Narrative: The patient received radiosurgery to one lesion and a partial treatment to a second intracranial lesion. She became anxious during treatment to the second lesion and this was discontinued. The patient was planned to return later in the week to resume treatment to additional targets. However, the patient was unable to cooperate fully with treatment to the necessary degree when she returned. The patient's status markedly declined over the last couple of weeks.  ________________________________  [name], M.D., Ph.D.

## 2013-10-21 NOTE — Addendum Note (Signed)
Encounter addended by: Jonna Coup, MD on: 10/21/2013  6:24 PM<BR>     Documentation filed: Notes Section

## 2013-10-21 NOTE — Progress Notes (Signed)
  Radiation Oncology         [phone] ________________________________  Name: Kristen Parker MRN: [id number]  Date: 09/03/2013  DOB: Apr 13, 1951   SPECIAL TREATMENT PROCEDURE   3D TREATMENT PLANNING AND DOSIMETRY: The patient's radiation plan was reviewed and approved by Dr. Franky Macho from neurosurgery and radiation oncology prior to treatment. It showed 3-dimensional radiation distributions overlaid onto the planning CT/MRI image set. The [facility] for the target structures as well as the organs at risk were reviewed. The documentation of the 3D plan and dosimetry are filed in the radiation oncology EMR.   NARRATIVE: The patient was brought to the TrueBeam stereotactic radiation treatment machine and placed supine on the CT couch. The head frame was applied, and the patient was set up for stereotactic radiosurgery. Neurosurgery was present for the set-up and delivery   SIMULATION VERIFICATION: In the couch zero-angle position, the patient underwent Exactrac imaging using the Brainlab system with orthogonal KV images. These were carefully aligned and repeated to confirm treatment position for each of the isocenters. The Exactrac snap film verification was repeated at each couch angle.   SPECIAL TREATMENT PROCEDURE: The patient received stereotactic radiosurgery to the following targets:  1. PTV1 Rt inf cerebellar 15 mm target was treated using 3 dynamic conformal Arcs to a prescription dose of 20 Gy. ExacTrac Snap verification was performed for each couch angle.  2.  PTV2 Lt cerebellar 2 mm target was planned  using 2 circular collimator Arcs to a prescription dose of 20 Gy. ExacTrac Snap verification was performed for each couch angle. The patient received a partial treatment to this lesion. She was unable to complete the entirety.  STEREOTACTIC TREATMENT MANAGEMENT: Following delivery, the patient was transported to nursing in stable condition and monitored for possible acute effects. Vital signs  were recorded . The patient did not exhibit any significant acute effects. She became very anxious and was unable to finish her prescribed fraction of treatment to the second target. After observation, she was discharged to home in stable condition.  PLAN: The patient was scheduled to return to our department later in the week to proceed with treatment to additional lesions.   ------------------------------------------------  [name], MD, PhD

## 2013-10-21 NOTE — Progress Notes (Signed)
  Radiation Oncology         [phone] ________________________________  Name: Kristen Parker MRN: [id number]  Date: 09/01/2013  DOB: 1951-08-15  SIMULATION AND TREATMENT PLANNING NOTE  DIAGNOSIS:  Metastatic breast cancer  NARRATIVE:  The patient was brought to the CT Simulation planning suite.  Identity was confirmed.  the patient will receive stereotactic radiosurgery to 9 intracranial lesions. All relevant records and images related to the planned course of therapy were reviewed.  The patient freely provided informed written consent to proceed with treatment after reviewing the details related to the planned course of therapy. The consent form was witnessed and verified by the simulation staff. Intravenous access was established for contrast administration. Then, the patient was set-up in a stable reproducible supine position for radiation therapy.  A relocatable thermoplastic stereotactic head frame was fabricated for precise immobilization.  CT images were obtained.  Surface markings were placed.  The CT images were loaded into the planning software and fused with the patient's targeting MRI scan.  Then the target and avoidance structures were contoured.  Treatment planning then occurred.  The radiation prescription was entered and confirmed.  I have requested 3D planning  I have requested a DVH of the following structures: Brain stem, brain, left eye, right I, lenses, optic chiasm, target volumes, uninvolved brain, and normal tissue.    PLAN:  The patient will receive 20 Gy in 1 fraction to each of the 9 intracranial lesions.  ________________________________  [name], MD, PhD

## 2013-10-21 NOTE — Addendum Note (Signed)
Encounter addended by: Jonna Coup, MD on: 10/21/2013  6:13 PM<BR>     Documentation filed: Visit Diagnoses, Notes Section

## 2013-12-27 ENCOUNTER — Other Ambulatory Visit (HOSPITAL_COMMUNITY): Payer: Self-pay | Admitting: Oncology

## 2014-02-15 NOTE — Addendum Note (Signed)
Encounter addended by: Winfield Cunas, MD on: 02/15/2014 10:07 AM<BR>     Documentation filed: Clinical Notes

## 2014-02-15 NOTE — Op Note (Signed)
  Name: Kristen Parker  MRN: [id number]  Date: 09/03/2013   DOB: [birth year]  Stereotactic Radiosurgery Operative Note  PRE-OPERATIVE DIAGNOSIS:  Multiple Brain Metastases  POST-OPERATIVE DIAGNOSIS:  Multiple Brain Metastases  PROCEDURE:  Stereotactic Radiosurgery  SURGEON:  Kyriakos Babler L, MD  NARRATIVE: The patient underwent a radiation treatment planning session in the radiation oncology simulation suite under the care of the radiation oncology physician and physicist.  I participated closely in the radiation treatment planning afterwards. The patient underwent planning CT which was fused to 3T high resolution MRI with 1 mm axial slices.  These images were fused on the planning system.  We contoured the gross target volumes and subsequently expanded this to yield the Planning Target Volume. I actively participated in the planning process.  I helped to define and review the target contours and also the contours of the optic pathway, eyes, brainstem and selected nearby organs at risk.  All the dose constraints for critical structures were reviewed and compared to AAPM Task Group 101.  The prescription dose conformity was reviewed.  I approved the plan electronically.    Accordingly, Kristen Parker was brought to the TrueBeam stereotactic radiation treatment linac and placed in the custom immobilization mask.  The patient was aligned according to the IR fiducial markers with BrainLab Exactrac, then orthogonal x-rays were used in ExacTrac with the 6DOF robotic table and the shifts were made to align the patient  Kristen Parker received stereotactic radiosurgery uneventfully.    Lesions treated:  2   Complex lesions treated:  0 (>3.5 cm, <30mm of optic path, or within the brainstem)   The detailed description of the procedure is recorded in the radiation oncology procedure note.  I was present for the duration of the procedure.  DISPOSITION:  Following delivery, the patient was transported to  nursing in stable condition and monitored for possible acute effects to be discharged to home in stable condition with follow-up in one month.  Braun Rocca L, MD 02/15/2014 10:07 AM

## 2014-07-17 IMAGING — CT NM PET TUM IMG RESTAG (PS) SKULL BASE T - THIGH
1 of 5 series · 1 of 25 positions shown · IV contrast (350 om)
Comparison: Multiple priors, most recently 05/18/2012.

CLINICAL DATA: Subsequent treatment strategy for breast cancer.
Restaging scan.

NUCLEAR MEDICINE PET SKULL BASE TO THIGH
Fasting Blood Glucose:  114
TECHNIQUE: 17.2 mCi F-18 FDG was injected intravenously. CT data
was obtained and used for attenuation correction and anatomic
localization only.  (This was not acquired as a diagnostic CT
examination.) Additional exam technical data entered on
technologist worksheet.

[Series 2: ct images · axial · 3.8mm · 0.98mm/px · 1 of 267 slices shown]
[im 267/267  brain]
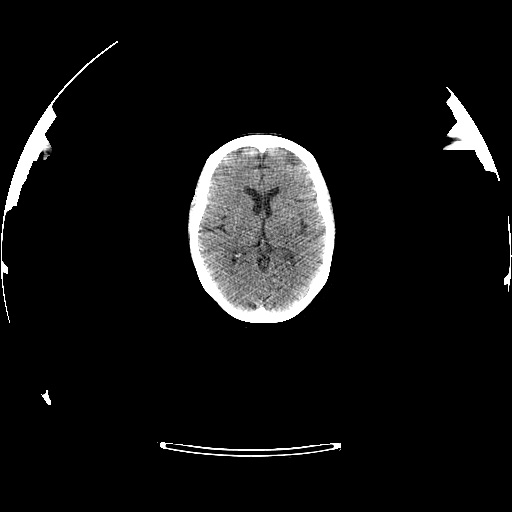

[1 of 25 positions shown; findings below may reference images not displayed]

FINDINGS: Neck: No hypermetabolic lymph nodes in the neck.

Chest:  No hypermetabolic mediastinal or hilar nodes.  No
suspicious pulmonary nodules on the CT scan. Status post modified
radical right sided mastectomy and axillary nodal dissection.  No
definite focal soft tissue mass is noted along the right chest wall
or in the right axilla to suggest local recurrence of disease.
Left subclavian single lumen Port-A-Cath with tip terminating in
the proximal superior vena cava.

Abdomen/Pelvis:  No abnormal hypermetabolic activity within the
liver, pancreas, adrenal glands, or spleen.  No hypermetabolic
lymph nodes in the abdomen or pelvis.  1.8 cm low attenuation non
hypermetabolic lesion in segment 4A of the liver is unchanged, and
likely to represent a cyst.  Status post cholecystectomy.  The
unenhanced appearance of the pancreas, spleen, bilateral adrenal
glands and bilateral kidneys is unremarkable.  Uterus is mildly
heterogeneous enlarged, likely to represent a fibroid uterus.
Urinary bladder is nearly completely decompressed.  No ascites or
pneumoperitoneum and no pathologic distension of bowel.

[place]:  As with the prior examination there are innumerable
predominately sclerotic metastases throughout the visualized axial
and appendicular skeleton.  These appear very similar to the prior
examination.  No definite pathologic fractures identified on
today's examination.  When compared to the prior study, these
lesions uniformly appear more hypermetabolic, which could suggest
some bony healing.  No definitive change in the appearance of the
lesions otherwise noted to strongly suggest progressive metastatic
disease.  There are bilateral pars defects at L4 with 7 mm of
anterolisthesis of L4 upon L5.
IMPRESSION: 1.  Widespread osseous metastatic disease redemonstrated.  The
overall appearance by CT imaging is essentially unchanged, although
there is diffusely increased hypermetabolic activity throughout the
visualized axial and appendicular skeleton compared to the prior
examination.  Overall, the findings are favored to reflect a
response to therapy with interval bony healing, rather than
progressive bony metastases.
2.  No extraskeletal metastatic disease identified on today's
examination.
3. Grade 1 spondylolisthesis of L4 upon L5.
4.  Other incidental findings in the chest, abdomen and pelvis,
similar to prior examinations, as detailed above.

## 2014-10-03 ENCOUNTER — Encounter (HOSPITAL_COMMUNITY): Payer: Self-pay | Admitting: Emergency Medicine
